# Patient Record
Sex: Female | Born: 1960 | Race: Black or African American | Hispanic: No | Smoking: Former smoker
Health system: Southern US, Community
[De-identification: ages and names within clinical notes are randomized; demographics above are authoritative.]

## PROBLEM LIST (undated history)

## (undated) DIAGNOSIS — M545 Low back pain, unspecified: Secondary | ICD-10-CM

## (undated) DIAGNOSIS — I272 Pulmonary hypertension, unspecified: Secondary | ICD-10-CM

## (undated) DIAGNOSIS — M4800 Spinal stenosis, site unspecified: Secondary | ICD-10-CM

## (undated) DIAGNOSIS — J8410 Pulmonary fibrosis, unspecified: Secondary | ICD-10-CM

## (undated) DIAGNOSIS — M519 Unspecified thoracic, thoracolumbar and lumbosacral intervertebral disc disorder: Secondary | ICD-10-CM

## (undated) DIAGNOSIS — L930 Discoid lupus erythematosus: Secondary | ICD-10-CM

## (undated) DIAGNOSIS — E119 Type 2 diabetes mellitus without complications: Secondary | ICD-10-CM

## (undated) DIAGNOSIS — K224 Dyskinesia of esophagus: Secondary | ICD-10-CM

## (undated) DIAGNOSIS — G629 Polyneuropathy, unspecified: Secondary | ICD-10-CM

## (undated) DIAGNOSIS — L309 Dermatitis, unspecified: Secondary | ICD-10-CM

## (undated) DIAGNOSIS — J449 Chronic obstructive pulmonary disease, unspecified: Secondary | ICD-10-CM

## (undated) DIAGNOSIS — M349 Systemic sclerosis, unspecified: Secondary | ICD-10-CM

## (undated) DIAGNOSIS — K219 Gastro-esophageal reflux disease without esophagitis: Secondary | ICD-10-CM

## (undated) DIAGNOSIS — L943 Sclerodactyly: Secondary | ICD-10-CM

## (undated) DIAGNOSIS — I7300 Raynaud's syndrome without gangrene: Secondary | ICD-10-CM

## (undated) DIAGNOSIS — I10 Essential (primary) hypertension: Secondary | ICD-10-CM

## (undated) DIAGNOSIS — A6000 Herpesviral infection of urogenital system, unspecified: Secondary | ICD-10-CM

## (undated) DIAGNOSIS — B009 Herpesviral infection, unspecified: Secondary | ICD-10-CM

## (undated) HISTORY — PX: DILATION AND CURETTAGE OF UTERUS: SHX78

## (undated) HISTORY — PX: TUBAL LIGATION: SHX77

## (undated) HISTORY — PX: SPLENECTOMY: SUR1306

## (undated) HISTORY — PX: OTHER SURGICAL HISTORY: SHX169

## (undated) HISTORY — DX: Herpesviral infection, unspecified: B00.9

---

## 2004-08-06 ENCOUNTER — Ambulatory Visit: Payer: Self-pay

## 2006-11-17 ENCOUNTER — Ambulatory Visit: Payer: Self-pay | Admitting: Rheumatology

## 2007-03-09 ENCOUNTER — Ambulatory Visit: Payer: Self-pay | Admitting: Unknown Physician Specialty

## 2008-03-09 ENCOUNTER — Ambulatory Visit: Payer: Self-pay | Admitting: Unknown Physician Specialty

## 2009-03-13 ENCOUNTER — Ambulatory Visit: Payer: Self-pay | Admitting: Unknown Physician Specialty

## 2009-03-19 ENCOUNTER — Ambulatory Visit: Payer: Self-pay | Admitting: Rheumatology

## 2010-03-12 ENCOUNTER — Ambulatory Visit: Payer: Self-pay | Admitting: Unknown Physician Specialty

## 2010-04-04 ENCOUNTER — Encounter: Payer: Self-pay | Admitting: Rheumatology

## 2010-04-27 ENCOUNTER — Encounter: Payer: Self-pay | Admitting: Rheumatology

## 2010-05-28 ENCOUNTER — Encounter: Payer: Self-pay | Admitting: Rheumatology

## 2011-03-28 ENCOUNTER — Ambulatory Visit: Payer: Self-pay | Admitting: Cardiology

## 2011-05-15 ENCOUNTER — Ambulatory Visit: Payer: Self-pay | Admitting: Unknown Physician Specialty

## 2011-09-05 ENCOUNTER — Ambulatory Visit: Payer: Self-pay | Admitting: Gastroenterology

## 2011-09-05 LAB — KOH PREP

## 2011-09-08 LAB — PATHOLOGY REPORT

## 2011-12-16 ENCOUNTER — Ambulatory Visit: Payer: Self-pay | Admitting: Rheumatology

## 2012-05-17 ENCOUNTER — Ambulatory Visit: Payer: Self-pay | Admitting: Unknown Physician Specialty

## 2012-05-24 ENCOUNTER — Ambulatory Visit: Payer: Self-pay | Admitting: Rheumatology

## 2013-05-14 ENCOUNTER — Ambulatory Visit: Payer: Self-pay | Admitting: Physical Medicine and Rehabilitation

## 2013-12-13 DIAGNOSIS — M519 Unspecified thoracic, thoracolumbar and lumbosacral intervertebral disc disorder: Secondary | ICD-10-CM | POA: Insufficient documentation

## 2013-12-13 DIAGNOSIS — M351 Other overlap syndromes: Secondary | ICD-10-CM | POA: Insufficient documentation

## 2013-12-14 DIAGNOSIS — I272 Pulmonary hypertension, unspecified: Secondary | ICD-10-CM | POA: Insufficient documentation

## 2013-12-14 DIAGNOSIS — J8410 Pulmonary fibrosis, unspecified: Secondary | ICD-10-CM | POA: Insufficient documentation

## 2014-08-04 DIAGNOSIS — J449 Chronic obstructive pulmonary disease, unspecified: Secondary | ICD-10-CM | POA: Diagnosis not present

## 2014-08-07 DIAGNOSIS — J449 Chronic obstructive pulmonary disease, unspecified: Secondary | ICD-10-CM | POA: Diagnosis not present

## 2014-09-04 DIAGNOSIS — J449 Chronic obstructive pulmonary disease, unspecified: Secondary | ICD-10-CM | POA: Diagnosis not present

## 2014-09-07 DIAGNOSIS — J449 Chronic obstructive pulmonary disease, unspecified: Secondary | ICD-10-CM | POA: Diagnosis not present

## 2014-09-21 DIAGNOSIS — J449 Chronic obstructive pulmonary disease, unspecified: Secondary | ICD-10-CM | POA: Diagnosis not present

## 2014-09-21 DIAGNOSIS — Z79899 Other long term (current) drug therapy: Secondary | ICD-10-CM | POA: Diagnosis not present

## 2014-09-21 DIAGNOSIS — E663 Overweight: Secondary | ICD-10-CM | POA: Diagnosis not present

## 2014-09-21 DIAGNOSIS — R1310 Dysphagia, unspecified: Secondary | ICD-10-CM | POA: Diagnosis not present

## 2014-09-21 DIAGNOSIS — R0602 Shortness of breath: Secondary | ICD-10-CM | POA: Diagnosis not present

## 2014-09-21 DIAGNOSIS — R7309 Other abnormal glucose: Secondary | ICD-10-CM | POA: Diagnosis not present

## 2014-09-21 DIAGNOSIS — J8410 Pulmonary fibrosis, unspecified: Secondary | ICD-10-CM | POA: Diagnosis not present

## 2014-09-21 DIAGNOSIS — R799 Abnormal finding of blood chemistry, unspecified: Secondary | ICD-10-CM | POA: Diagnosis not present

## 2014-09-21 DIAGNOSIS — M351 Other overlap syndromes: Secondary | ICD-10-CM | POA: Diagnosis not present

## 2014-10-03 DIAGNOSIS — J449 Chronic obstructive pulmonary disease, unspecified: Secondary | ICD-10-CM | POA: Diagnosis not present

## 2014-10-06 DIAGNOSIS — I10 Essential (primary) hypertension: Secondary | ICD-10-CM | POA: Diagnosis not present

## 2014-10-06 DIAGNOSIS — R7309 Other abnormal glucose: Secondary | ICD-10-CM | POA: Diagnosis not present

## 2014-10-06 DIAGNOSIS — K219 Gastro-esophageal reflux disease without esophagitis: Secondary | ICD-10-CM | POA: Diagnosis not present

## 2014-10-06 DIAGNOSIS — J449 Chronic obstructive pulmonary disease, unspecified: Secondary | ICD-10-CM | POA: Diagnosis not present

## 2014-11-03 DIAGNOSIS — J449 Chronic obstructive pulmonary disease, unspecified: Secondary | ICD-10-CM | POA: Diagnosis not present

## 2014-11-06 DIAGNOSIS — J449 Chronic obstructive pulmonary disease, unspecified: Secondary | ICD-10-CM | POA: Diagnosis not present

## 2014-12-03 DIAGNOSIS — J449 Chronic obstructive pulmonary disease, unspecified: Secondary | ICD-10-CM | POA: Diagnosis not present

## 2014-12-05 DIAGNOSIS — Z79899 Other long term (current) drug therapy: Secondary | ICD-10-CM | POA: Diagnosis not present

## 2014-12-05 DIAGNOSIS — M351 Other overlap syndromes: Secondary | ICD-10-CM | POA: Diagnosis not present

## 2014-12-06 DIAGNOSIS — J449 Chronic obstructive pulmonary disease, unspecified: Secondary | ICD-10-CM | POA: Diagnosis not present

## 2014-12-12 DIAGNOSIS — Z79899 Other long term (current) drug therapy: Secondary | ICD-10-CM | POA: Diagnosis not present

## 2014-12-12 DIAGNOSIS — M351 Other overlap syndromes: Secondary | ICD-10-CM | POA: Diagnosis not present

## 2014-12-12 DIAGNOSIS — J849 Interstitial pulmonary disease, unspecified: Secondary | ICD-10-CM | POA: Diagnosis not present

## 2014-12-12 DIAGNOSIS — H6123 Impacted cerumen, bilateral: Secondary | ICD-10-CM | POA: Diagnosis not present

## 2014-12-12 DIAGNOSIS — I7300 Raynaud's syndrome without gangrene: Secondary | ICD-10-CM | POA: Diagnosis not present

## 2015-01-03 DIAGNOSIS — J449 Chronic obstructive pulmonary disease, unspecified: Secondary | ICD-10-CM | POA: Diagnosis not present

## 2015-01-06 DIAGNOSIS — J449 Chronic obstructive pulmonary disease, unspecified: Secondary | ICD-10-CM | POA: Diagnosis not present

## 2015-01-11 DIAGNOSIS — M349 Systemic sclerosis, unspecified: Secondary | ICD-10-CM | POA: Diagnosis not present

## 2015-01-11 DIAGNOSIS — R7309 Other abnormal glucose: Secondary | ICD-10-CM | POA: Diagnosis not present

## 2015-01-11 DIAGNOSIS — I10 Essential (primary) hypertension: Secondary | ICD-10-CM | POA: Diagnosis not present

## 2015-01-11 DIAGNOSIS — Z8679 Personal history of other diseases of the circulatory system: Secondary | ICD-10-CM | POA: Diagnosis not present

## 2015-02-02 DIAGNOSIS — J449 Chronic obstructive pulmonary disease, unspecified: Secondary | ICD-10-CM | POA: Diagnosis not present

## 2015-02-05 DIAGNOSIS — J449 Chronic obstructive pulmonary disease, unspecified: Secondary | ICD-10-CM | POA: Diagnosis not present

## 2015-03-05 DIAGNOSIS — J449 Chronic obstructive pulmonary disease, unspecified: Secondary | ICD-10-CM | POA: Diagnosis not present

## 2015-03-08 DIAGNOSIS — J449 Chronic obstructive pulmonary disease, unspecified: Secondary | ICD-10-CM | POA: Diagnosis not present

## 2015-03-14 DIAGNOSIS — I7300 Raynaud's syndrome without gangrene: Secondary | ICD-10-CM | POA: Diagnosis not present

## 2015-03-14 DIAGNOSIS — M351 Other overlap syndromes: Secondary | ICD-10-CM | POA: Diagnosis not present

## 2015-03-14 DIAGNOSIS — Z79899 Other long term (current) drug therapy: Secondary | ICD-10-CM | POA: Diagnosis not present

## 2015-03-29 DIAGNOSIS — I10 Essential (primary) hypertension: Secondary | ICD-10-CM | POA: Diagnosis not present

## 2015-03-29 DIAGNOSIS — R7309 Other abnormal glucose: Secondary | ICD-10-CM | POA: Diagnosis not present

## 2015-03-29 DIAGNOSIS — K219 Gastro-esophageal reflux disease without esophagitis: Secondary | ICD-10-CM | POA: Diagnosis not present

## 2015-04-05 DIAGNOSIS — I10 Essential (primary) hypertension: Secondary | ICD-10-CM | POA: Diagnosis not present

## 2015-04-05 DIAGNOSIS — E780 Pure hypercholesterolemia: Secondary | ICD-10-CM | POA: Diagnosis not present

## 2015-04-05 DIAGNOSIS — J449 Chronic obstructive pulmonary disease, unspecified: Secondary | ICD-10-CM | POA: Diagnosis not present

## 2015-04-05 DIAGNOSIS — Z0000 Encounter for general adult medical examination without abnormal findings: Secondary | ICD-10-CM | POA: Diagnosis not present

## 2015-04-05 DIAGNOSIS — R7309 Other abnormal glucose: Secondary | ICD-10-CM | POA: Diagnosis not present

## 2015-04-08 DIAGNOSIS — J449 Chronic obstructive pulmonary disease, unspecified: Secondary | ICD-10-CM | POA: Diagnosis not present

## 2015-04-24 DIAGNOSIS — I272 Other secondary pulmonary hypertension: Secondary | ICD-10-CM | POA: Diagnosis not present

## 2015-04-24 DIAGNOSIS — M349 Systemic sclerosis, unspecified: Secondary | ICD-10-CM | POA: Diagnosis not present

## 2015-04-24 DIAGNOSIS — R0602 Shortness of breath: Secondary | ICD-10-CM | POA: Diagnosis not present

## 2015-04-24 DIAGNOSIS — J8410 Pulmonary fibrosis, unspecified: Secondary | ICD-10-CM | POA: Diagnosis not present

## 2015-04-24 DIAGNOSIS — R0902 Hypoxemia: Secondary | ICD-10-CM | POA: Diagnosis not present

## 2015-05-05 DIAGNOSIS — J449 Chronic obstructive pulmonary disease, unspecified: Secondary | ICD-10-CM | POA: Diagnosis not present

## 2015-05-08 DIAGNOSIS — J449 Chronic obstructive pulmonary disease, unspecified: Secondary | ICD-10-CM | POA: Diagnosis not present

## 2015-05-11 DIAGNOSIS — Z23 Encounter for immunization: Secondary | ICD-10-CM | POA: Diagnosis not present

## 2015-06-05 DIAGNOSIS — J449 Chronic obstructive pulmonary disease, unspecified: Secondary | ICD-10-CM | POA: Diagnosis not present

## 2015-06-08 DIAGNOSIS — J449 Chronic obstructive pulmonary disease, unspecified: Secondary | ICD-10-CM | POA: Diagnosis not present

## 2015-06-19 DIAGNOSIS — I7300 Raynaud's syndrome without gangrene: Secondary | ICD-10-CM | POA: Diagnosis not present

## 2015-06-19 DIAGNOSIS — M351 Other overlap syndromes: Secondary | ICD-10-CM | POA: Diagnosis not present

## 2015-06-19 DIAGNOSIS — Z79899 Other long term (current) drug therapy: Secondary | ICD-10-CM | POA: Diagnosis not present

## 2015-06-26 DIAGNOSIS — M351 Other overlap syndromes: Secondary | ICD-10-CM | POA: Diagnosis not present

## 2015-06-26 DIAGNOSIS — J849 Interstitial pulmonary disease, unspecified: Secondary | ICD-10-CM | POA: Diagnosis not present

## 2015-06-26 DIAGNOSIS — Z79899 Other long term (current) drug therapy: Secondary | ICD-10-CM | POA: Diagnosis not present

## 2015-06-26 DIAGNOSIS — I7300 Raynaud's syndrome without gangrene: Secondary | ICD-10-CM | POA: Diagnosis not present

## 2015-07-05 DIAGNOSIS — J449 Chronic obstructive pulmonary disease, unspecified: Secondary | ICD-10-CM | POA: Diagnosis not present

## 2015-07-08 DIAGNOSIS — J449 Chronic obstructive pulmonary disease, unspecified: Secondary | ICD-10-CM | POA: Diagnosis not present

## 2015-07-12 DIAGNOSIS — Z1379 Encounter for other screening for genetic and chromosomal anomalies: Secondary | ICD-10-CM | POA: Diagnosis not present

## 2015-07-12 DIAGNOSIS — Z1211 Encounter for screening for malignant neoplasm of colon: Secondary | ICD-10-CM | POA: Diagnosis not present

## 2015-07-12 DIAGNOSIS — Z1231 Encounter for screening mammogram for malignant neoplasm of breast: Secondary | ICD-10-CM | POA: Diagnosis not present

## 2015-07-12 DIAGNOSIS — Z315 Encounter for genetic counseling: Secondary | ICD-10-CM | POA: Diagnosis not present

## 2015-07-12 DIAGNOSIS — Z01419 Encounter for gynecological examination (general) (routine) without abnormal findings: Secondary | ICD-10-CM | POA: Diagnosis not present

## 2015-07-17 LAB — HM PAP SMEAR: HM Pap smear: NEGATIVE

## 2015-08-05 DIAGNOSIS — J449 Chronic obstructive pulmonary disease, unspecified: Secondary | ICD-10-CM | POA: Diagnosis not present

## 2015-08-08 DIAGNOSIS — J449 Chronic obstructive pulmonary disease, unspecified: Secondary | ICD-10-CM | POA: Diagnosis not present

## 2015-09-05 DIAGNOSIS — J449 Chronic obstructive pulmonary disease, unspecified: Secondary | ICD-10-CM | POA: Diagnosis not present

## 2015-09-08 DIAGNOSIS — J449 Chronic obstructive pulmonary disease, unspecified: Secondary | ICD-10-CM | POA: Diagnosis not present

## 2015-09-17 DIAGNOSIS — Z79899 Other long term (current) drug therapy: Secondary | ICD-10-CM | POA: Diagnosis not present

## 2015-09-17 DIAGNOSIS — M351 Other overlap syndromes: Secondary | ICD-10-CM | POA: Diagnosis not present

## 2015-09-26 DIAGNOSIS — R7309 Other abnormal glucose: Secondary | ICD-10-CM | POA: Diagnosis not present

## 2015-09-26 DIAGNOSIS — I10 Essential (primary) hypertension: Secondary | ICD-10-CM | POA: Diagnosis not present

## 2015-09-26 DIAGNOSIS — E7800 Pure hypercholesterolemia, unspecified: Secondary | ICD-10-CM | POA: Diagnosis not present

## 2015-09-26 DIAGNOSIS — R7303 Prediabetes: Secondary | ICD-10-CM | POA: Diagnosis not present

## 2015-10-03 DIAGNOSIS — I10 Essential (primary) hypertension: Secondary | ICD-10-CM | POA: Diagnosis not present

## 2015-10-03 DIAGNOSIS — E7800 Pure hypercholesterolemia, unspecified: Secondary | ICD-10-CM | POA: Diagnosis not present

## 2015-10-03 DIAGNOSIS — K219 Gastro-esophageal reflux disease without esophagitis: Secondary | ICD-10-CM | POA: Diagnosis not present

## 2015-10-03 DIAGNOSIS — R7303 Prediabetes: Secondary | ICD-10-CM | POA: Diagnosis not present

## 2015-10-03 DIAGNOSIS — J449 Chronic obstructive pulmonary disease, unspecified: Secondary | ICD-10-CM | POA: Diagnosis not present

## 2015-10-06 DIAGNOSIS — J449 Chronic obstructive pulmonary disease, unspecified: Secondary | ICD-10-CM | POA: Diagnosis not present

## 2015-10-16 DIAGNOSIS — R0602 Shortness of breath: Secondary | ICD-10-CM | POA: Diagnosis not present

## 2015-10-23 DIAGNOSIS — R0902 Hypoxemia: Secondary | ICD-10-CM | POA: Diagnosis not present

## 2015-10-23 DIAGNOSIS — J8410 Pulmonary fibrosis, unspecified: Secondary | ICD-10-CM | POA: Diagnosis not present

## 2015-10-23 DIAGNOSIS — R0602 Shortness of breath: Secondary | ICD-10-CM | POA: Diagnosis not present

## 2015-10-23 DIAGNOSIS — K219 Gastro-esophageal reflux disease without esophagitis: Secondary | ICD-10-CM | POA: Diagnosis not present

## 2015-10-23 DIAGNOSIS — M349 Systemic sclerosis, unspecified: Secondary | ICD-10-CM | POA: Diagnosis not present

## 2015-10-23 DIAGNOSIS — I272 Other secondary pulmonary hypertension: Secondary | ICD-10-CM | POA: Diagnosis not present

## 2015-11-03 DIAGNOSIS — J449 Chronic obstructive pulmonary disease, unspecified: Secondary | ICD-10-CM | POA: Diagnosis not present

## 2015-11-06 DIAGNOSIS — J449 Chronic obstructive pulmonary disease, unspecified: Secondary | ICD-10-CM | POA: Diagnosis not present

## 2015-12-03 DIAGNOSIS — J449 Chronic obstructive pulmonary disease, unspecified: Secondary | ICD-10-CM | POA: Diagnosis not present

## 2015-12-06 DIAGNOSIS — J449 Chronic obstructive pulmonary disease, unspecified: Secondary | ICD-10-CM | POA: Diagnosis not present

## 2015-12-18 DIAGNOSIS — M351 Other overlap syndromes: Secondary | ICD-10-CM | POA: Diagnosis not present

## 2015-12-18 DIAGNOSIS — Z79899 Other long term (current) drug therapy: Secondary | ICD-10-CM | POA: Diagnosis not present

## 2015-12-25 DIAGNOSIS — I7300 Raynaud's syndrome without gangrene: Secondary | ICD-10-CM | POA: Diagnosis not present

## 2015-12-25 DIAGNOSIS — B029 Zoster without complications: Secondary | ICD-10-CM | POA: Diagnosis not present

## 2015-12-25 DIAGNOSIS — M351 Other overlap syndromes: Secondary | ICD-10-CM | POA: Diagnosis not present

## 2015-12-25 DIAGNOSIS — J849 Interstitial pulmonary disease, unspecified: Secondary | ICD-10-CM | POA: Diagnosis not present

## 2016-01-02 DIAGNOSIS — I272 Other secondary pulmonary hypertension: Secondary | ICD-10-CM | POA: Diagnosis not present

## 2016-01-02 DIAGNOSIS — I10 Essential (primary) hypertension: Secondary | ICD-10-CM | POA: Diagnosis not present

## 2016-01-03 DIAGNOSIS — J449 Chronic obstructive pulmonary disease, unspecified: Secondary | ICD-10-CM | POA: Diagnosis not present

## 2016-01-06 DIAGNOSIS — J449 Chronic obstructive pulmonary disease, unspecified: Secondary | ICD-10-CM | POA: Diagnosis not present

## 2016-01-08 ENCOUNTER — Encounter: Payer: Self-pay | Admitting: *Deleted

## 2016-01-08 ENCOUNTER — Ambulatory Visit
Admission: RE | Admit: 2016-01-08 | Discharge: 2016-01-08 | Disposition: A | Discharge status: Home / Self Care | Payer: Commercial Managed Care - HMO | Source: Ambulatory Visit | Attending: Cardiology | Admitting: Cardiology

## 2016-01-08 ENCOUNTER — Encounter
Admission: RE | Disposition: A | Discharge status: Home / Self Care | Payer: Self-pay | Source: Ambulatory Visit | Attending: Cardiology

## 2016-01-08 DIAGNOSIS — I272 Other secondary pulmonary hypertension: Secondary | ICD-10-CM | POA: Insufficient documentation

## 2016-01-08 DIAGNOSIS — I7300 Raynaud's syndrome without gangrene: Secondary | ICD-10-CM | POA: Insufficient documentation

## 2016-01-08 DIAGNOSIS — L943 Sclerodactyly: Secondary | ICD-10-CM | POA: Diagnosis not present

## 2016-01-08 DIAGNOSIS — Z7982 Long term (current) use of aspirin: Secondary | ICD-10-CM | POA: Diagnosis not present

## 2016-01-08 DIAGNOSIS — Z79899 Other long term (current) drug therapy: Secondary | ICD-10-CM | POA: Diagnosis not present

## 2016-01-08 DIAGNOSIS — J8410 Pulmonary fibrosis, unspecified: Secondary | ICD-10-CM | POA: Insufficient documentation

## 2016-01-08 DIAGNOSIS — M4800 Spinal stenosis, site unspecified: Secondary | ICD-10-CM | POA: Diagnosis not present

## 2016-01-08 DIAGNOSIS — M349 Systemic sclerosis, unspecified: Secondary | ICD-10-CM | POA: Diagnosis not present

## 2016-01-08 DIAGNOSIS — Z9889 Other specified postprocedural states: Secondary | ICD-10-CM | POA: Diagnosis not present

## 2016-01-08 DIAGNOSIS — M351 Other overlap syndromes: Secondary | ICD-10-CM | POA: Insufficient documentation

## 2016-01-08 DIAGNOSIS — Z9851 Tubal ligation status: Secondary | ICD-10-CM | POA: Insufficient documentation

## 2016-01-08 DIAGNOSIS — I10 Essential (primary) hypertension: Secondary | ICD-10-CM | POA: Insufficient documentation

## 2016-01-08 DIAGNOSIS — R7303 Prediabetes: Secondary | ICD-10-CM | POA: Insufficient documentation

## 2016-01-08 DIAGNOSIS — K219 Gastro-esophageal reflux disease without esophagitis: Secondary | ICD-10-CM | POA: Insufficient documentation

## 2016-01-08 DIAGNOSIS — Z87891 Personal history of nicotine dependence: Secondary | ICD-10-CM | POA: Insufficient documentation

## 2016-01-08 DIAGNOSIS — G629 Polyneuropathy, unspecified: Secondary | ICD-10-CM | POA: Diagnosis not present

## 2016-01-08 DIAGNOSIS — Z809 Family history of malignant neoplasm, unspecified: Secondary | ICD-10-CM | POA: Insufficient documentation

## 2016-01-08 DIAGNOSIS — Z888 Allergy status to other drugs, medicaments and biological substances status: Secondary | ICD-10-CM | POA: Diagnosis not present

## 2016-01-08 HISTORY — DX: Pulmonary fibrosis, unspecified: J84.10

## 2016-01-08 HISTORY — DX: Gastro-esophageal reflux disease without esophagitis: K21.9

## 2016-01-08 HISTORY — DX: Low back pain, unspecified: M54.50

## 2016-01-08 HISTORY — DX: Essential (primary) hypertension: I10

## 2016-01-08 HISTORY — DX: Unspecified thoracic, thoracolumbar and lumbosacral intervertebral disc disorder: M51.9

## 2016-01-08 HISTORY — DX: Low back pain: M54.5

## 2016-01-08 HISTORY — DX: Raynaud's syndrome without gangrene: I73.00

## 2016-01-08 HISTORY — DX: Systemic sclerosis, unspecified: M34.9

## 2016-01-08 HISTORY — DX: Discoid lupus erythematosus: L93.0

## 2016-01-08 HISTORY — DX: Dermatitis, unspecified: L30.9

## 2016-01-08 HISTORY — DX: Pulmonary hypertension, unspecified: I27.20

## 2016-01-08 HISTORY — DX: Chronic obstructive pulmonary disease, unspecified: J44.9

## 2016-01-08 HISTORY — DX: Dyskinesia of esophagus: K22.4

## 2016-01-08 HISTORY — DX: Sclerodactyly: L94.3

## 2016-01-08 HISTORY — DX: Type 2 diabetes mellitus without complications: E11.9

## 2016-01-08 HISTORY — DX: Herpesviral infection of urogenital system, unspecified: A60.00

## 2016-01-08 HISTORY — DX: Polyneuropathy, unspecified: G62.9

## 2016-01-08 HISTORY — DX: Spinal stenosis, site unspecified: M48.00

## 2016-01-08 SURGERY — RIGHT HEART CATH AND CORONARY ANGIOGRAPHY
Anesthesia: Moderate Sedation | Laterality: Right

## 2016-01-08 MED ORDER — ASPIRIN 81 MG PO CHEW: 81.0000 mg | CHEWABLE_TABLET | ORAL | Status: DC

## 2016-01-08 MED ORDER — SODIUM CHLORIDE 0.9% FLUSH: 3.0000 mL | Freq: Two times a day (BID) | INTRAVENOUS | Status: DC

## 2016-01-08 MED ORDER — MIDAZOLAM HCL 2 MG/2ML IJ SOLN
INTRAMUSCULAR | Status: AC
  Filled 2016-01-08: qty 2

## 2016-01-08 MED ORDER — SODIUM CHLORIDE 0.9 % WEIGHT BASED INFUSION: 3.0000 mL/kg/h | INTRAVENOUS | Status: DC

## 2016-01-08 MED ORDER — SODIUM CHLORIDE 0.9% FLUSH: 3.0000 mL | INTRAVENOUS | Status: DC | PRN

## 2016-01-08 MED ORDER — FENTANYL CITRATE (PF) 100 MCG/2ML IJ SOLN
INTRAMUSCULAR | Status: DC | PRN
  Administered 2016-01-08: 25 ug via INTRAVENOUS
  Administered 2016-01-08: 50 ug via INTRAVENOUS

## 2016-01-08 MED ORDER — SODIUM CHLORIDE 0.9 % WEIGHT BASED INFUSION: 1.0000 mL/kg/h | INTRAVENOUS | Status: DC

## 2016-01-08 MED ORDER — FENTANYL CITRATE (PF) 100 MCG/2ML IJ SOLN
INTRAMUSCULAR | Status: AC
  Filled 2016-01-08: qty 2

## 2016-01-08 MED ORDER — SODIUM CHLORIDE 0.9 % IV SOLN: 250.0000 mL | INTRAVENOUS | Status: DC | PRN

## 2016-01-08 MED ORDER — MIDAZOLAM HCL 2 MG/2ML IJ SOLN
INTRAMUSCULAR | Status: DC | PRN
Start: 2016-01-08 — End: 2016-01-08
  Administered 2016-01-08 (×2): 1 mg via INTRAVENOUS

## 2016-01-08 SURGICAL SUPPLY — 6 items
CATH SWANZ 7F THERMO (CATHETERS) ×3 IMPLANT
GUIDEWIRE EMER 3M J .025X150CM (WIRE) ×3 IMPLANT
KIT RIGHT HEART (MISCELLANEOUS) ×3 IMPLANT
NEEDLE PERC 18GX7CM (NEEDLE) ×6 IMPLANT
PACK CARDIAC CATH (CUSTOM PROCEDURE TRAY) ×3 IMPLANT
SHEATH PINNACLE 7F 10CM (SHEATH) ×3 IMPLANT

## 2016-01-08 NOTE — Discharge Instructions (Signed)

## 2016-01-09 ENCOUNTER — Encounter: Payer: Self-pay | Admitting: Cardiology

## 2016-01-11 DIAGNOSIS — J8410 Pulmonary fibrosis, unspecified: Secondary | ICD-10-CM | POA: Diagnosis not present

## 2016-01-11 DIAGNOSIS — I10 Essential (primary) hypertension: Secondary | ICD-10-CM | POA: Diagnosis not present

## 2016-01-11 DIAGNOSIS — I272 Other secondary pulmonary hypertension: Secondary | ICD-10-CM | POA: Diagnosis not present

## 2016-01-11 DIAGNOSIS — E7800 Pure hypercholesterolemia, unspecified: Secondary | ICD-10-CM | POA: Diagnosis not present

## 2016-02-02 DIAGNOSIS — J449 Chronic obstructive pulmonary disease, unspecified: Secondary | ICD-10-CM | POA: Diagnosis not present

## 2016-02-05 DIAGNOSIS — J449 Chronic obstructive pulmonary disease, unspecified: Secondary | ICD-10-CM | POA: Diagnosis not present

## 2016-03-04 DIAGNOSIS — J449 Chronic obstructive pulmonary disease, unspecified: Secondary | ICD-10-CM | POA: Diagnosis not present

## 2016-03-07 DIAGNOSIS — J449 Chronic obstructive pulmonary disease, unspecified: Secondary | ICD-10-CM | POA: Diagnosis not present

## 2016-03-24 DIAGNOSIS — E7800 Pure hypercholesterolemia, unspecified: Secondary | ICD-10-CM | POA: Diagnosis not present

## 2016-03-24 DIAGNOSIS — I10 Essential (primary) hypertension: Secondary | ICD-10-CM | POA: Diagnosis not present

## 2016-03-24 DIAGNOSIS — M351 Other overlap syndromes: Secondary | ICD-10-CM | POA: Diagnosis not present

## 2016-03-24 DIAGNOSIS — R7303 Prediabetes: Secondary | ICD-10-CM | POA: Diagnosis not present

## 2016-04-01 DIAGNOSIS — Z0000 Encounter for general adult medical examination without abnormal findings: Secondary | ICD-10-CM | POA: Diagnosis not present

## 2016-04-01 DIAGNOSIS — I10 Essential (primary) hypertension: Secondary | ICD-10-CM | POA: Diagnosis not present

## 2016-04-04 DIAGNOSIS — J449 Chronic obstructive pulmonary disease, unspecified: Secondary | ICD-10-CM | POA: Diagnosis not present

## 2016-04-07 DIAGNOSIS — J449 Chronic obstructive pulmonary disease, unspecified: Secondary | ICD-10-CM | POA: Diagnosis not present

## 2016-04-08 DIAGNOSIS — I10 Essential (primary) hypertension: Secondary | ICD-10-CM | POA: Diagnosis not present

## 2016-04-08 DIAGNOSIS — E7800 Pure hypercholesterolemia, unspecified: Secondary | ICD-10-CM | POA: Diagnosis not present

## 2016-04-08 DIAGNOSIS — R7303 Prediabetes: Secondary | ICD-10-CM | POA: Diagnosis not present

## 2016-04-08 DIAGNOSIS — Z0000 Encounter for general adult medical examination without abnormal findings: Secondary | ICD-10-CM | POA: Diagnosis not present

## 2016-04-29 DIAGNOSIS — Z23 Encounter for immunization: Secondary | ICD-10-CM | POA: Diagnosis not present

## 2016-05-04 DIAGNOSIS — J449 Chronic obstructive pulmonary disease, unspecified: Secondary | ICD-10-CM | POA: Diagnosis not present

## 2016-05-07 DIAGNOSIS — J449 Chronic obstructive pulmonary disease, unspecified: Secondary | ICD-10-CM | POA: Diagnosis not present

## 2016-06-04 DIAGNOSIS — J449 Chronic obstructive pulmonary disease, unspecified: Secondary | ICD-10-CM | POA: Diagnosis not present

## 2016-06-07 DIAGNOSIS — J449 Chronic obstructive pulmonary disease, unspecified: Secondary | ICD-10-CM | POA: Diagnosis not present

## 2016-06-09 ENCOUNTER — Other Ambulatory Visit: Payer: Self-pay | Admitting: Obstetrics & Gynecology

## 2016-06-09 DIAGNOSIS — Z1231 Encounter for screening mammogram for malignant neoplasm of breast: Secondary | ICD-10-CM

## 2016-06-16 DIAGNOSIS — M351 Other overlap syndromes: Secondary | ICD-10-CM | POA: Diagnosis not present

## 2016-06-23 DIAGNOSIS — R42 Dizziness and giddiness: Secondary | ICD-10-CM | POA: Diagnosis not present

## 2016-06-23 DIAGNOSIS — I7300 Raynaud's syndrome without gangrene: Secondary | ICD-10-CM | POA: Diagnosis not present

## 2016-06-23 DIAGNOSIS — J849 Interstitial pulmonary disease, unspecified: Secondary | ICD-10-CM | POA: Diagnosis not present

## 2016-06-23 DIAGNOSIS — Z79899 Other long term (current) drug therapy: Secondary | ICD-10-CM | POA: Diagnosis not present

## 2016-06-23 DIAGNOSIS — M351 Other overlap syndromes: Secondary | ICD-10-CM | POA: Diagnosis not present

## 2016-06-24 DIAGNOSIS — R0602 Shortness of breath: Secondary | ICD-10-CM | POA: Diagnosis not present

## 2016-06-24 DIAGNOSIS — R0902 Hypoxemia: Secondary | ICD-10-CM | POA: Diagnosis not present

## 2016-06-24 DIAGNOSIS — I272 Pulmonary hypertension, unspecified: Secondary | ICD-10-CM | POA: Diagnosis not present

## 2016-06-24 DIAGNOSIS — J8410 Pulmonary fibrosis, unspecified: Secondary | ICD-10-CM | POA: Diagnosis not present

## 2016-06-24 DIAGNOSIS — M349 Systemic sclerosis, unspecified: Secondary | ICD-10-CM | POA: Diagnosis not present

## 2016-07-04 DIAGNOSIS — J449 Chronic obstructive pulmonary disease, unspecified: Secondary | ICD-10-CM | POA: Diagnosis not present

## 2016-07-07 DIAGNOSIS — J449 Chronic obstructive pulmonary disease, unspecified: Secondary | ICD-10-CM | POA: Diagnosis not present

## 2016-07-15 ENCOUNTER — Ambulatory Visit
Admission: RE | Admit: 2016-07-15 | Discharge: 2016-07-15 | Disposition: A | Discharge status: Home / Self Care | Payer: Commercial Managed Care - HMO | Source: Ambulatory Visit | Attending: Obstetrics & Gynecology | Admitting: Obstetrics & Gynecology

## 2016-07-15 DIAGNOSIS — Z1231 Encounter for screening mammogram for malignant neoplasm of breast: Secondary | ICD-10-CM | POA: Insufficient documentation

## 2016-07-15 DIAGNOSIS — Z01419 Encounter for gynecological examination (general) (routine) without abnormal findings: Secondary | ICD-10-CM | POA: Diagnosis not present

## 2016-07-15 LAB — HM MAMMOGRAPHY

## 2016-07-17 ENCOUNTER — Inpatient Hospital Stay
Admission: RE | Admit: 2016-07-17 | Discharge: 2016-07-17 | Disposition: A | Discharge status: Home / Self Care | Payer: Self-pay | Source: Ambulatory Visit | Attending: *Deleted | Admitting: *Deleted

## 2016-07-17 ENCOUNTER — Other Ambulatory Visit: Payer: Self-pay | Admitting: *Deleted

## 2016-07-17 DIAGNOSIS — R42 Dizziness and giddiness: Secondary | ICD-10-CM | POA: Diagnosis not present

## 2016-07-17 DIAGNOSIS — Z9289 Personal history of other medical treatment: Secondary | ICD-10-CM

## 2016-07-23 DIAGNOSIS — H40013 Open angle with borderline findings, low risk, bilateral: Secondary | ICD-10-CM | POA: Diagnosis not present

## 2016-07-23 DIAGNOSIS — H35033 Hypertensive retinopathy, bilateral: Secondary | ICD-10-CM | POA: Diagnosis not present

## 2016-07-23 DIAGNOSIS — H52223 Regular astigmatism, bilateral: Secondary | ICD-10-CM | POA: Diagnosis not present

## 2016-07-23 DIAGNOSIS — H5213 Myopia, bilateral: Secondary | ICD-10-CM | POA: Diagnosis not present

## 2016-07-23 DIAGNOSIS — H35413 Lattice degeneration of retina, bilateral: Secondary | ICD-10-CM | POA: Diagnosis not present

## 2016-07-23 DIAGNOSIS — H34231 Retinal artery branch occlusion, right eye: Secondary | ICD-10-CM | POA: Diagnosis not present

## 2016-07-23 DIAGNOSIS — I10 Essential (primary) hypertension: Secondary | ICD-10-CM | POA: Diagnosis not present

## 2016-07-23 DIAGNOSIS — H524 Presbyopia: Secondary | ICD-10-CM | POA: Diagnosis not present

## 2016-08-04 DIAGNOSIS — J449 Chronic obstructive pulmonary disease, unspecified: Secondary | ICD-10-CM | POA: Diagnosis not present

## 2016-08-07 DIAGNOSIS — J449 Chronic obstructive pulmonary disease, unspecified: Secondary | ICD-10-CM | POA: Diagnosis not present

## 2016-08-15 DIAGNOSIS — H43823 Vitreomacular adhesion, bilateral: Secondary | ICD-10-CM | POA: Diagnosis not present

## 2016-08-15 DIAGNOSIS — H35033 Hypertensive retinopathy, bilateral: Secondary | ICD-10-CM | POA: Diagnosis not present

## 2016-08-15 DIAGNOSIS — H34831 Tributary (branch) retinal vein occlusion, right eye, with macular edema: Secondary | ICD-10-CM | POA: Diagnosis not present

## 2016-08-28 DIAGNOSIS — H35033 Hypertensive retinopathy, bilateral: Secondary | ICD-10-CM | POA: Diagnosis not present

## 2016-08-28 DIAGNOSIS — H34831 Tributary (branch) retinal vein occlusion, right eye, with macular edema: Secondary | ICD-10-CM | POA: Diagnosis not present

## 2016-09-04 DIAGNOSIS — J449 Chronic obstructive pulmonary disease, unspecified: Secondary | ICD-10-CM | POA: Diagnosis not present

## 2016-09-07 DIAGNOSIS — J449 Chronic obstructive pulmonary disease, unspecified: Secondary | ICD-10-CM | POA: Diagnosis not present

## 2016-09-23 DIAGNOSIS — M351 Other overlap syndromes: Secondary | ICD-10-CM | POA: Diagnosis not present

## 2016-09-23 DIAGNOSIS — Z79899 Other long term (current) drug therapy: Secondary | ICD-10-CM | POA: Diagnosis not present

## 2016-09-26 DIAGNOSIS — H6121 Impacted cerumen, right ear: Secondary | ICD-10-CM | POA: Diagnosis not present

## 2016-09-29 DIAGNOSIS — R7303 Prediabetes: Secondary | ICD-10-CM | POA: Diagnosis not present

## 2016-09-29 DIAGNOSIS — I10 Essential (primary) hypertension: Secondary | ICD-10-CM | POA: Diagnosis not present

## 2016-09-29 DIAGNOSIS — E7800 Pure hypercholesterolemia, unspecified: Secondary | ICD-10-CM | POA: Diagnosis not present

## 2016-10-02 DIAGNOSIS — H35033 Hypertensive retinopathy, bilateral: Secondary | ICD-10-CM | POA: Diagnosis not present

## 2016-10-02 DIAGNOSIS — H34831 Tributary (branch) retinal vein occlusion, right eye, with macular edema: Secondary | ICD-10-CM | POA: Diagnosis not present

## 2016-10-02 DIAGNOSIS — J449 Chronic obstructive pulmonary disease, unspecified: Secondary | ICD-10-CM | POA: Diagnosis not present

## 2016-10-02 DIAGNOSIS — H35413 Lattice degeneration of retina, bilateral: Secondary | ICD-10-CM | POA: Diagnosis not present

## 2016-10-02 DIAGNOSIS — H43822 Vitreomacular adhesion, left eye: Secondary | ICD-10-CM | POA: Diagnosis not present

## 2016-10-05 DIAGNOSIS — J449 Chronic obstructive pulmonary disease, unspecified: Secondary | ICD-10-CM | POA: Diagnosis not present

## 2016-10-07 DIAGNOSIS — I10 Essential (primary) hypertension: Secondary | ICD-10-CM | POA: Diagnosis not present

## 2016-10-07 DIAGNOSIS — R7303 Prediabetes: Secondary | ICD-10-CM | POA: Diagnosis not present

## 2016-10-07 DIAGNOSIS — K219 Gastro-esophageal reflux disease without esophagitis: Secondary | ICD-10-CM | POA: Diagnosis not present

## 2016-10-07 DIAGNOSIS — M5417 Radiculopathy, lumbosacral region: Secondary | ICD-10-CM | POA: Diagnosis not present

## 2016-10-07 DIAGNOSIS — E7800 Pure hypercholesterolemia, unspecified: Secondary | ICD-10-CM | POA: Diagnosis not present

## 2016-11-02 DIAGNOSIS — J449 Chronic obstructive pulmonary disease, unspecified: Secondary | ICD-10-CM | POA: Diagnosis not present

## 2016-11-05 DIAGNOSIS — J449 Chronic obstructive pulmonary disease, unspecified: Secondary | ICD-10-CM | POA: Diagnosis not present

## 2016-11-06 DIAGNOSIS — H35033 Hypertensive retinopathy, bilateral: Secondary | ICD-10-CM | POA: Diagnosis not present

## 2016-11-06 DIAGNOSIS — H35413 Lattice degeneration of retina, bilateral: Secondary | ICD-10-CM | POA: Diagnosis not present

## 2016-11-06 DIAGNOSIS — H34831 Tributary (branch) retinal vein occlusion, right eye, with macular edema: Secondary | ICD-10-CM | POA: Diagnosis not present

## 2016-11-06 DIAGNOSIS — H43822 Vitreomacular adhesion, left eye: Secondary | ICD-10-CM | POA: Diagnosis not present

## 2016-12-02 DIAGNOSIS — J449 Chronic obstructive pulmonary disease, unspecified: Secondary | ICD-10-CM | POA: Diagnosis not present

## 2016-12-04 DIAGNOSIS — H35033 Hypertensive retinopathy, bilateral: Secondary | ICD-10-CM | POA: Diagnosis not present

## 2016-12-04 DIAGNOSIS — H34831 Tributary (branch) retinal vein occlusion, right eye, with macular edema: Secondary | ICD-10-CM | POA: Diagnosis not present

## 2016-12-04 DIAGNOSIS — H43822 Vitreomacular adhesion, left eye: Secondary | ICD-10-CM | POA: Diagnosis not present

## 2016-12-04 DIAGNOSIS — H31091 Other chorioretinal scars, right eye: Secondary | ICD-10-CM | POA: Diagnosis not present

## 2016-12-05 DIAGNOSIS — J449 Chronic obstructive pulmonary disease, unspecified: Secondary | ICD-10-CM | POA: Diagnosis not present

## 2016-12-16 DIAGNOSIS — Z79899 Other long term (current) drug therapy: Secondary | ICD-10-CM | POA: Diagnosis not present

## 2016-12-16 DIAGNOSIS — M351 Other overlap syndromes: Secondary | ICD-10-CM | POA: Diagnosis not present

## 2016-12-23 DIAGNOSIS — J849 Interstitial pulmonary disease, unspecified: Secondary | ICD-10-CM | POA: Diagnosis not present

## 2016-12-23 DIAGNOSIS — I7300 Raynaud's syndrome without gangrene: Secondary | ICD-10-CM | POA: Diagnosis not present

## 2016-12-23 DIAGNOSIS — M351 Other overlap syndromes: Secondary | ICD-10-CM | POA: Diagnosis not present

## 2016-12-23 DIAGNOSIS — Z79899 Other long term (current) drug therapy: Secondary | ICD-10-CM | POA: Diagnosis not present

## 2016-12-29 DIAGNOSIS — R05 Cough: Secondary | ICD-10-CM | POA: Diagnosis not present

## 2016-12-30 DIAGNOSIS — R0602 Shortness of breath: Secondary | ICD-10-CM | POA: Diagnosis not present

## 2016-12-30 DIAGNOSIS — I272 Pulmonary hypertension, unspecified: Secondary | ICD-10-CM | POA: Diagnosis not present

## 2016-12-30 DIAGNOSIS — R05 Cough: Secondary | ICD-10-CM | POA: Diagnosis not present

## 2016-12-30 DIAGNOSIS — J8410 Pulmonary fibrosis, unspecified: Secondary | ICD-10-CM | POA: Diagnosis not present

## 2017-01-01 DIAGNOSIS — H34831 Tributary (branch) retinal vein occlusion, right eye, with macular edema: Secondary | ICD-10-CM | POA: Diagnosis not present

## 2017-01-01 DIAGNOSIS — H35413 Lattice degeneration of retina, bilateral: Secondary | ICD-10-CM | POA: Diagnosis not present

## 2017-01-01 DIAGNOSIS — H31091 Other chorioretinal scars, right eye: Secondary | ICD-10-CM | POA: Diagnosis not present

## 2017-01-01 DIAGNOSIS — H35033 Hypertensive retinopathy, bilateral: Secondary | ICD-10-CM | POA: Diagnosis not present

## 2017-01-02 DIAGNOSIS — J449 Chronic obstructive pulmonary disease, unspecified: Secondary | ICD-10-CM | POA: Diagnosis not present

## 2017-01-05 DIAGNOSIS — J449 Chronic obstructive pulmonary disease, unspecified: Secondary | ICD-10-CM | POA: Diagnosis not present

## 2017-01-29 DIAGNOSIS — H35413 Lattice degeneration of retina, bilateral: Secondary | ICD-10-CM | POA: Diagnosis not present

## 2017-01-29 DIAGNOSIS — H35033 Hypertensive retinopathy, bilateral: Secondary | ICD-10-CM | POA: Diagnosis not present

## 2017-01-29 DIAGNOSIS — H34831 Tributary (branch) retinal vein occlusion, right eye, with macular edema: Secondary | ICD-10-CM | POA: Diagnosis not present

## 2017-01-29 DIAGNOSIS — H43822 Vitreomacular adhesion, left eye: Secondary | ICD-10-CM | POA: Diagnosis not present

## 2017-02-01 DIAGNOSIS — J449 Chronic obstructive pulmonary disease, unspecified: Secondary | ICD-10-CM | POA: Diagnosis not present

## 2017-02-04 DIAGNOSIS — J449 Chronic obstructive pulmonary disease, unspecified: Secondary | ICD-10-CM | POA: Diagnosis not present

## 2017-03-02 DIAGNOSIS — H43822 Vitreomacular adhesion, left eye: Secondary | ICD-10-CM | POA: Diagnosis not present

## 2017-03-02 DIAGNOSIS — H35033 Hypertensive retinopathy, bilateral: Secondary | ICD-10-CM | POA: Diagnosis not present

## 2017-03-02 DIAGNOSIS — H35413 Lattice degeneration of retina, bilateral: Secondary | ICD-10-CM | POA: Diagnosis not present

## 2017-03-02 DIAGNOSIS — H34831 Tributary (branch) retinal vein occlusion, right eye, with macular edema: Secondary | ICD-10-CM | POA: Diagnosis not present

## 2017-03-04 DIAGNOSIS — J449 Chronic obstructive pulmonary disease, unspecified: Secondary | ICD-10-CM | POA: Diagnosis not present

## 2017-03-07 DIAGNOSIS — J449 Chronic obstructive pulmonary disease, unspecified: Secondary | ICD-10-CM | POA: Diagnosis not present

## 2017-03-10 DIAGNOSIS — E7800 Pure hypercholesterolemia, unspecified: Secondary | ICD-10-CM | POA: Diagnosis not present

## 2017-03-10 DIAGNOSIS — I10 Essential (primary) hypertension: Secondary | ICD-10-CM | POA: Diagnosis not present

## 2017-03-10 DIAGNOSIS — I272 Pulmonary hypertension, unspecified: Secondary | ICD-10-CM | POA: Diagnosis not present

## 2017-03-17 DIAGNOSIS — M351 Other overlap syndromes: Secondary | ICD-10-CM | POA: Diagnosis not present

## 2017-03-17 DIAGNOSIS — J849 Interstitial pulmonary disease, unspecified: Secondary | ICD-10-CM | POA: Diagnosis not present

## 2017-03-17 DIAGNOSIS — Z79899 Other long term (current) drug therapy: Secondary | ICD-10-CM | POA: Diagnosis not present

## 2017-03-17 DIAGNOSIS — I7300 Raynaud's syndrome without gangrene: Secondary | ICD-10-CM | POA: Diagnosis not present

## 2017-04-02 DIAGNOSIS — H35413 Lattice degeneration of retina, bilateral: Secondary | ICD-10-CM | POA: Diagnosis not present

## 2017-04-02 DIAGNOSIS — H43822 Vitreomacular adhesion, left eye: Secondary | ICD-10-CM | POA: Diagnosis not present

## 2017-04-02 DIAGNOSIS — H35033 Hypertensive retinopathy, bilateral: Secondary | ICD-10-CM | POA: Diagnosis not present

## 2017-04-02 DIAGNOSIS — H34831 Tributary (branch) retinal vein occlusion, right eye, with macular edema: Secondary | ICD-10-CM | POA: Diagnosis not present

## 2017-04-04 DIAGNOSIS — J449 Chronic obstructive pulmonary disease, unspecified: Secondary | ICD-10-CM | POA: Diagnosis not present

## 2017-04-07 DIAGNOSIS — J449 Chronic obstructive pulmonary disease, unspecified: Secondary | ICD-10-CM | POA: Diagnosis not present

## 2017-04-14 DIAGNOSIS — I10 Essential (primary) hypertension: Secondary | ICD-10-CM | POA: Diagnosis not present

## 2017-04-14 DIAGNOSIS — R7303 Prediabetes: Secondary | ICD-10-CM | POA: Diagnosis not present

## 2017-04-14 DIAGNOSIS — E7800 Pure hypercholesterolemia, unspecified: Secondary | ICD-10-CM | POA: Diagnosis not present

## 2017-04-14 DIAGNOSIS — K219 Gastro-esophageal reflux disease without esophagitis: Secondary | ICD-10-CM | POA: Diagnosis not present

## 2017-04-21 DIAGNOSIS — E7800 Pure hypercholesterolemia, unspecified: Secondary | ICD-10-CM | POA: Diagnosis not present

## 2017-04-21 DIAGNOSIS — R7303 Prediabetes: Secondary | ICD-10-CM | POA: Diagnosis not present

## 2017-04-21 DIAGNOSIS — Z23 Encounter for immunization: Secondary | ICD-10-CM | POA: Diagnosis not present

## 2017-04-21 DIAGNOSIS — I10 Essential (primary) hypertension: Secondary | ICD-10-CM | POA: Diagnosis not present

## 2017-04-21 DIAGNOSIS — Z0000 Encounter for general adult medical examination without abnormal findings: Secondary | ICD-10-CM | POA: Diagnosis not present

## 2017-05-04 DIAGNOSIS — H34831 Tributary (branch) retinal vein occlusion, right eye, with macular edema: Secondary | ICD-10-CM | POA: Diagnosis not present

## 2017-05-04 DIAGNOSIS — H43822 Vitreomacular adhesion, left eye: Secondary | ICD-10-CM | POA: Diagnosis not present

## 2017-05-04 DIAGNOSIS — J449 Chronic obstructive pulmonary disease, unspecified: Secondary | ICD-10-CM | POA: Diagnosis not present

## 2017-05-04 DIAGNOSIS — H35413 Lattice degeneration of retina, bilateral: Secondary | ICD-10-CM | POA: Diagnosis not present

## 2017-05-04 DIAGNOSIS — H35033 Hypertensive retinopathy, bilateral: Secondary | ICD-10-CM | POA: Diagnosis not present

## 2017-05-07 DIAGNOSIS — J449 Chronic obstructive pulmonary disease, unspecified: Secondary | ICD-10-CM | POA: Diagnosis not present

## 2017-06-01 DIAGNOSIS — H35413 Lattice degeneration of retina, bilateral: Secondary | ICD-10-CM | POA: Diagnosis not present

## 2017-06-01 DIAGNOSIS — H43822 Vitreomacular adhesion, left eye: Secondary | ICD-10-CM | POA: Diagnosis not present

## 2017-06-01 DIAGNOSIS — H34831 Tributary (branch) retinal vein occlusion, right eye, with macular edema: Secondary | ICD-10-CM | POA: Diagnosis not present

## 2017-06-01 DIAGNOSIS — H35033 Hypertensive retinopathy, bilateral: Secondary | ICD-10-CM | POA: Diagnosis not present

## 2017-06-04 DIAGNOSIS — J449 Chronic obstructive pulmonary disease, unspecified: Secondary | ICD-10-CM | POA: Diagnosis not present

## 2017-06-07 DIAGNOSIS — J449 Chronic obstructive pulmonary disease, unspecified: Secondary | ICD-10-CM | POA: Diagnosis not present

## 2017-06-08 ENCOUNTER — Other Ambulatory Visit: Payer: Self-pay | Admitting: Obstetrics & Gynecology

## 2017-06-08 DIAGNOSIS — Z1231 Encounter for screening mammogram for malignant neoplasm of breast: Secondary | ICD-10-CM

## 2017-06-16 DIAGNOSIS — I7300 Raynaud's syndrome without gangrene: Secondary | ICD-10-CM | POA: Diagnosis not present

## 2017-06-16 DIAGNOSIS — J849 Interstitial pulmonary disease, unspecified: Secondary | ICD-10-CM | POA: Diagnosis not present

## 2017-06-16 DIAGNOSIS — M351 Other overlap syndromes: Secondary | ICD-10-CM | POA: Diagnosis not present

## 2017-06-16 DIAGNOSIS — Z79899 Other long term (current) drug therapy: Secondary | ICD-10-CM | POA: Diagnosis not present

## 2017-06-23 DIAGNOSIS — J849 Interstitial pulmonary disease, unspecified: Secondary | ICD-10-CM | POA: Diagnosis not present

## 2017-06-23 DIAGNOSIS — I7300 Raynaud's syndrome without gangrene: Secondary | ICD-10-CM | POA: Diagnosis not present

## 2017-06-23 DIAGNOSIS — Z79899 Other long term (current) drug therapy: Secondary | ICD-10-CM | POA: Diagnosis not present

## 2017-06-23 DIAGNOSIS — M351 Other overlap syndromes: Secondary | ICD-10-CM | POA: Diagnosis not present

## 2017-06-23 DIAGNOSIS — K6289 Other specified diseases of anus and rectum: Secondary | ICD-10-CM | POA: Diagnosis not present

## 2017-06-29 DIAGNOSIS — H35033 Hypertensive retinopathy, bilateral: Secondary | ICD-10-CM | POA: Diagnosis not present

## 2017-06-29 DIAGNOSIS — H31091 Other chorioretinal scars, right eye: Secondary | ICD-10-CM | POA: Diagnosis not present

## 2017-06-29 DIAGNOSIS — H35413 Lattice degeneration of retina, bilateral: Secondary | ICD-10-CM | POA: Diagnosis not present

## 2017-06-29 DIAGNOSIS — H34831 Tributary (branch) retinal vein occlusion, right eye, with macular edema: Secondary | ICD-10-CM | POA: Diagnosis not present

## 2017-06-30 DIAGNOSIS — R0602 Shortness of breath: Secondary | ICD-10-CM | POA: Diagnosis not present

## 2017-07-04 DIAGNOSIS — J449 Chronic obstructive pulmonary disease, unspecified: Secondary | ICD-10-CM | POA: Diagnosis not present

## 2017-07-07 DIAGNOSIS — J449 Chronic obstructive pulmonary disease, unspecified: Secondary | ICD-10-CM | POA: Diagnosis not present

## 2017-07-16 ENCOUNTER — Encounter: Payer: Self-pay | Admitting: Obstetrics & Gynecology

## 2017-07-16 ENCOUNTER — Ambulatory Visit (INDEPENDENT_AMBULATORY_CARE_PROVIDER_SITE_OTHER): Payer: Medicare HMO | Admitting: Obstetrics & Gynecology

## 2017-07-16 VITALS — BP 130/80 | HR 96 | Ht 65.0 in | Wt 170.0 lb

## 2017-07-16 DIAGNOSIS — Z1231 Encounter for screening mammogram for malignant neoplasm of breast: Secondary | ICD-10-CM

## 2017-07-16 DIAGNOSIS — Z01419 Encounter for gynecological examination (general) (routine) without abnormal findings: Secondary | ICD-10-CM | POA: Diagnosis not present

## 2017-07-16 DIAGNOSIS — Z1239 Encounter for other screening for malignant neoplasm of breast: Secondary | ICD-10-CM

## 2017-07-16 DIAGNOSIS — Z1211 Encounter for screening for malignant neoplasm of colon: Secondary | ICD-10-CM | POA: Diagnosis not present

## 2017-07-16 DIAGNOSIS — Z0000 Encounter for general adult medical examination without abnormal findings: Secondary | ICD-10-CM

## 2017-07-16 NOTE — Patient Instructions (Signed)
PAP every three years Mammogram every year    Call [phone] to schedule at [facility] Colonoscopy every 10 years Labs yearly (with PCP)

## 2017-07-16 NOTE — Progress Notes (Signed)
HPI:      Tammy Barrett is a 56 y.o. G1P1 who LMP was in the past, she presents today for her annual examination.  The patient has no complaints today. The patient is sexually active. Herlast pap: approximate date 2016 and was normal and last mammogram: approximate date 2017 and was normal.  The patient does perform self breast exams.  There is no notable family history of breast or ovarian cancer in her family. The patient is not taking hormone replacement therapy. Patient denies post-menopausal vaginal bleeding.   The patient has regular exercise: yes. The patient denies current symptoms of depression.    GYN Hx: Last Colonoscopy:5 years ago. Normal.  Last DEXA: never ago.    PMHx: Past Medical History:  Diagnosis Date  . COPD (chronic obstructive pulmonary disease) ([place])   . Diabetes mellitus without complication ([place])   . Discoid lupus   . Eczema   . Esophageal dysmotility   . Genital herpes   . GERD (gastroesophageal reflux disease)   . HSV (herpes simplex virus) infection   . Hypertension   . Lumbago   . Lumbar disc disease   . Peripheral neuropathy   . Pulmonary fibrosis ([place])   . Pulmonary hypertension ([place])   . Raynaud's disease   . Sclerodactyly   . Scleroderma ([place])   . Spinal stenosis    Past Surgical History:  Procedure Laterality Date  . bone removal from pinkie toe    . DILATION AND CURETTAGE OF UTERUS    . SPLENECTOMY    . TUBAL LIGATION     Family History  Problem Relation Age of Onset  . Uterine cancer Mother   . Stroke Father    Social History   Tobacco Use  . Smoking status: Former Smoker    Packs/day: 0.50    Types: Cigarettes    Last attempt to quit: 01/08/2011    Years since quitting: 6.5  . Smokeless tobacco: Never Used  Substance Use Topics  . Alcohol use: No  . Drug use: No    Current Outpatient Medications:  .  amLODipine (NORVASC) 5 MG tablet, Take 5 mg by mouth daily., Disp: , Rfl:  .  aspirin 81 MG tablet, Take 81 mg by mouth  daily., Disp: , Rfl:  .  gabapentin (NEURONTIN) 600 MG tablet, Take 600 mg by mouth 4 (four) times daily., Disp: , Rfl:  .  Multiple Vitamins-Minerals (MULTIVITAMIN WITH MINERALS) tablet, Take 1 tablet by mouth daily., Disp: , Rfl:  .  mycophenolate (CELLCEPT) 500 MG tablet, Take 500 mg by mouth 2 (two) times daily., Disp: , Rfl:  .  omeprazole (PRILOSEC) 20 MG capsule, Take 20 mg by mouth daily., Disp: , Rfl:  Allergies: Hydroxychloroquine  Review of Systems  Constitutional: Negative for chills, fever and malaise/fatigue.  HENT: Negative for congestion, sinus pain and sore throat.   Eyes: Negative for blurred vision and pain.  Respiratory: Negative for cough and wheezing.   Cardiovascular: Negative for chest pain and leg swelling.  Gastrointestinal: Negative for abdominal pain, constipation, diarrhea, heartburn, nausea and vomiting.  Genitourinary: Negative for dysuria, frequency, hematuria and urgency.  Musculoskeletal: Negative for back pain, joint pain, myalgias and neck pain.  Skin: Negative for itching and rash.  Neurological: Negative for dizziness, tremors and weakness.  Endo/Heme/Allergies: Does not bruise/bleed easily.  Psychiatric/Behavioral: Negative for depression. The patient is not nervous/anxious and does not have insomnia.     Objective: BP 130/80   Pulse 96  Ht _0  (1.651 m)   Wt 170 lb (77.1 kg)   BMI 28.29 kg/m   Filed Weights   07/16/17 1000  Weight: 170 lb (77.1 kg)   Body mass index is 28.29 kg/m. Physical Exam  Constitutional: She is oriented to person, place, and time. She appears well-developed and well-nourished. No distress.  Genitourinary: Rectum normal, vagina normal and uterus normal. Pelvic exam was performed with patient supine. There is no rash or lesion on the right labia. There is no rash or lesion on the left labia. Vagina exhibits no lesion. No bleeding in the vagina. Right adnexum does not display mass and does not display tenderness. Left  adnexum does not display mass and does not display tenderness. Cervix does not exhibit motion tenderness, lesion, friability or polyp.   Uterus is mobile and midaxial. Uterus is not enlarged or exhibiting a mass.  HENT:  Head: Normocephalic and atraumatic. Head is without laceration.  Right Ear: Hearing normal.  Left Ear: Hearing normal.  Nose: No epistaxis.  No foreign bodies.  Mouth/Throat: Uvula is midline, oropharynx is clear and moist and mucous membranes are normal.  Eyes: Pupils are equal, round, and reactive to light.  Neck: Normal range of motion. Neck supple. No thyromegaly present.  Cardiovascular: Normal rate and regular rhythm. Exam reveals no gallop and no friction rub.  No murmur heard. Pulmonary/Chest: Effort normal and breath sounds normal. No respiratory distress. She has no wheezes. Right breast exhibits no mass, no skin change and no tenderness. Left breast exhibits no mass, no skin change and no tenderness.  Abdominal: Soft. Bowel sounds are normal. She exhibits no distension. There is no tenderness. There is no rebound.  Musculoskeletal: Normal range of motion.  Neurological: She is alert and oriented to person, place, and time. No cranial nerve deficit.  Skin: Skin is warm and dry.  Psychiatric: She has a normal mood and affect. Judgment normal.  Vitals reviewed.   Assessment: Annual Exam 1. Annual physical exam   2. Screen for colon cancer   3. Screening for breast cancer     Plan:            1.  Cervical Screening-  Pap smear schedule reviewed with patient  2. Breast screening- Exam annually and mammogram scheduled  3. Colonoscopy every 10 years, Hemoccult testing after age 66  4. Labs managed by PCP  5. Counseling for hormonal therapy: none, no change in therapy today    F/U  Return in about 1 year (around 07/16/2018) for Annual.  Barnett Applebaum, MD, [facility], [facility] 07/16/2017  10:35 AM

## 2017-07-22 ENCOUNTER — Encounter: Payer: Self-pay | Admitting: Obstetrics & Gynecology

## 2017-07-22 ENCOUNTER — Ambulatory Visit
Admission: RE | Admit: 2017-07-22 | Discharge: 2017-07-22 | Disposition: A | Discharge status: Home / Self Care | Payer: Commercial Managed Care - HMO | Source: Ambulatory Visit | Attending: Obstetrics & Gynecology | Admitting: Obstetrics & Gynecology

## 2017-07-22 DIAGNOSIS — Z1231 Encounter for screening mammogram for malignant neoplasm of breast: Secondary | ICD-10-CM | POA: Insufficient documentation

## 2017-08-03 DIAGNOSIS — H35033 Hypertensive retinopathy, bilateral: Secondary | ICD-10-CM | POA: Diagnosis not present

## 2017-08-03 DIAGNOSIS — H43812 Vitreous degeneration, left eye: Secondary | ICD-10-CM | POA: Diagnosis not present

## 2017-08-03 DIAGNOSIS — H34831 Tributary (branch) retinal vein occlusion, right eye, with macular edema: Secondary | ICD-10-CM | POA: Diagnosis not present

## 2017-08-03 DIAGNOSIS — H43822 Vitreomacular adhesion, left eye: Secondary | ICD-10-CM | POA: Diagnosis not present

## 2017-08-04 DIAGNOSIS — J449 Chronic obstructive pulmonary disease, unspecified: Secondary | ICD-10-CM | POA: Diagnosis not present

## 2017-08-07 DIAGNOSIS — J449 Chronic obstructive pulmonary disease, unspecified: Secondary | ICD-10-CM | POA: Diagnosis not present

## 2017-09-01 DIAGNOSIS — K600 Acute anal fissure: Secondary | ICD-10-CM | POA: Diagnosis not present

## 2017-09-01 DIAGNOSIS — K625 Hemorrhage of anus and rectum: Secondary | ICD-10-CM | POA: Diagnosis not present

## 2017-09-04 DIAGNOSIS — J449 Chronic obstructive pulmonary disease, unspecified: Secondary | ICD-10-CM | POA: Diagnosis not present

## 2017-09-07 DIAGNOSIS — J449 Chronic obstructive pulmonary disease, unspecified: Secondary | ICD-10-CM | POA: Diagnosis not present

## 2017-09-10 DIAGNOSIS — Z1211 Encounter for screening for malignant neoplasm of colon: Secondary | ICD-10-CM | POA: Diagnosis not present

## 2017-09-12 LAB — SPECIMEN STATUS REPORT

## 2017-09-12 LAB — FECAL OCCULT BLOOD, IMMUNOCHEMICAL: Fecal Occult Bld: NEGATIVE

## 2017-09-14 DIAGNOSIS — H35033 Hypertensive retinopathy, bilateral: Secondary | ICD-10-CM | POA: Diagnosis not present

## 2017-09-14 DIAGNOSIS — H43822 Vitreomacular adhesion, left eye: Secondary | ICD-10-CM | POA: Diagnosis not present

## 2017-09-14 DIAGNOSIS — H43812 Vitreous degeneration, left eye: Secondary | ICD-10-CM | POA: Diagnosis not present

## 2017-09-14 DIAGNOSIS — H34831 Tributary (branch) retinal vein occlusion, right eye, with macular edema: Secondary | ICD-10-CM | POA: Diagnosis not present

## 2017-09-15 DIAGNOSIS — K6289 Other specified diseases of anus and rectum: Secondary | ICD-10-CM | POA: Diagnosis not present

## 2017-09-15 DIAGNOSIS — K625 Hemorrhage of anus and rectum: Secondary | ICD-10-CM | POA: Diagnosis not present

## 2017-09-22 DIAGNOSIS — I7300 Raynaud's syndrome without gangrene: Secondary | ICD-10-CM | POA: Diagnosis not present

## 2017-09-22 DIAGNOSIS — J849 Interstitial pulmonary disease, unspecified: Secondary | ICD-10-CM | POA: Diagnosis not present

## 2017-09-22 DIAGNOSIS — M351 Other overlap syndromes: Secondary | ICD-10-CM | POA: Diagnosis not present

## 2017-09-22 DIAGNOSIS — Z79899 Other long term (current) drug therapy: Secondary | ICD-10-CM | POA: Diagnosis not present

## 2017-09-25 DIAGNOSIS — K625 Hemorrhage of anus and rectum: Secondary | ICD-10-CM | POA: Diagnosis not present

## 2017-09-25 DIAGNOSIS — K640 First degree hemorrhoids: Secondary | ICD-10-CM | POA: Diagnosis not present

## 2017-09-25 DIAGNOSIS — K6289 Other specified diseases of anus and rectum: Secondary | ICD-10-CM | POA: Diagnosis not present

## 2017-10-02 DIAGNOSIS — J449 Chronic obstructive pulmonary disease, unspecified: Secondary | ICD-10-CM | POA: Diagnosis not present

## 2017-10-05 DIAGNOSIS — J449 Chronic obstructive pulmonary disease, unspecified: Secondary | ICD-10-CM | POA: Diagnosis not present

## 2017-10-13 DIAGNOSIS — E7800 Pure hypercholesterolemia, unspecified: Secondary | ICD-10-CM | POA: Diagnosis not present

## 2017-10-13 DIAGNOSIS — I10 Essential (primary) hypertension: Secondary | ICD-10-CM | POA: Diagnosis not present

## 2017-10-13 DIAGNOSIS — R7303 Prediabetes: Secondary | ICD-10-CM | POA: Diagnosis not present

## 2017-10-20 DIAGNOSIS — I10 Essential (primary) hypertension: Secondary | ICD-10-CM | POA: Diagnosis not present

## 2017-10-20 DIAGNOSIS — E7800 Pure hypercholesterolemia, unspecified: Secondary | ICD-10-CM | POA: Diagnosis not present

## 2017-10-20 DIAGNOSIS — R7303 Prediabetes: Secondary | ICD-10-CM | POA: Diagnosis not present

## 2017-10-20 DIAGNOSIS — K219 Gastro-esophageal reflux disease without esophagitis: Secondary | ICD-10-CM | POA: Diagnosis not present

## 2017-10-20 DIAGNOSIS — M5417 Radiculopathy, lumbosacral region: Secondary | ICD-10-CM | POA: Diagnosis not present

## 2017-10-26 DIAGNOSIS — H35362 Drusen (degenerative) of macula, left eye: Secondary | ICD-10-CM | POA: Diagnosis not present

## 2017-10-26 DIAGNOSIS — H34831 Tributary (branch) retinal vein occlusion, right eye, with macular edema: Secondary | ICD-10-CM | POA: Diagnosis not present

## 2017-10-26 DIAGNOSIS — H43822 Vitreomacular adhesion, left eye: Secondary | ICD-10-CM | POA: Diagnosis not present

## 2017-10-26 DIAGNOSIS — H35033 Hypertensive retinopathy, bilateral: Secondary | ICD-10-CM | POA: Diagnosis not present

## 2017-11-02 DIAGNOSIS — J449 Chronic obstructive pulmonary disease, unspecified: Secondary | ICD-10-CM | POA: Diagnosis not present

## 2017-11-05 DIAGNOSIS — J449 Chronic obstructive pulmonary disease, unspecified: Secondary | ICD-10-CM | POA: Diagnosis not present

## 2017-12-02 DIAGNOSIS — J449 Chronic obstructive pulmonary disease, unspecified: Secondary | ICD-10-CM | POA: Diagnosis not present

## 2017-12-05 DIAGNOSIS — J449 Chronic obstructive pulmonary disease, unspecified: Secondary | ICD-10-CM | POA: Diagnosis not present

## 2017-12-07 DIAGNOSIS — H43822 Vitreomacular adhesion, left eye: Secondary | ICD-10-CM | POA: Diagnosis not present

## 2017-12-07 DIAGNOSIS — H34831 Tributary (branch) retinal vein occlusion, right eye, with macular edema: Secondary | ICD-10-CM | POA: Diagnosis not present

## 2017-12-07 DIAGNOSIS — H35033 Hypertensive retinopathy, bilateral: Secondary | ICD-10-CM | POA: Diagnosis not present

## 2017-12-07 DIAGNOSIS — H3582 Retinal ischemia: Secondary | ICD-10-CM | POA: Diagnosis not present

## 2017-12-15 DIAGNOSIS — Z79899 Other long term (current) drug therapy: Secondary | ICD-10-CM | POA: Diagnosis not present

## 2017-12-15 DIAGNOSIS — I7300 Raynaud's syndrome without gangrene: Secondary | ICD-10-CM | POA: Diagnosis not present

## 2017-12-15 DIAGNOSIS — J849 Interstitial pulmonary disease, unspecified: Secondary | ICD-10-CM | POA: Diagnosis not present

## 2017-12-15 DIAGNOSIS — M351 Other overlap syndromes: Secondary | ICD-10-CM | POA: Diagnosis not present

## 2017-12-22 DIAGNOSIS — Z79899 Other long term (current) drug therapy: Secondary | ICD-10-CM | POA: Diagnosis not present

## 2017-12-22 DIAGNOSIS — M351 Other overlap syndromes: Secondary | ICD-10-CM | POA: Diagnosis not present

## 2017-12-22 DIAGNOSIS — J849 Interstitial pulmonary disease, unspecified: Secondary | ICD-10-CM | POA: Diagnosis not present

## 2017-12-22 DIAGNOSIS — I7300 Raynaud's syndrome without gangrene: Secondary | ICD-10-CM | POA: Diagnosis not present

## 2017-12-29 DIAGNOSIS — J8410 Pulmonary fibrosis, unspecified: Secondary | ICD-10-CM | POA: Diagnosis not present

## 2017-12-29 DIAGNOSIS — Z23 Encounter for immunization: Secondary | ICD-10-CM | POA: Diagnosis not present

## 2017-12-29 DIAGNOSIS — M349 Systemic sclerosis, unspecified: Secondary | ICD-10-CM | POA: Diagnosis not present

## 2017-12-29 DIAGNOSIS — Z9981 Dependence on supplemental oxygen: Secondary | ICD-10-CM | POA: Diagnosis not present

## 2017-12-29 DIAGNOSIS — R0609 Other forms of dyspnea: Secondary | ICD-10-CM | POA: Diagnosis not present

## 2018-01-02 DIAGNOSIS — J449 Chronic obstructive pulmonary disease, unspecified: Secondary | ICD-10-CM | POA: Diagnosis not present

## 2018-01-05 DIAGNOSIS — J449 Chronic obstructive pulmonary disease, unspecified: Secondary | ICD-10-CM | POA: Diagnosis not present

## 2018-01-14 DIAGNOSIS — H43822 Vitreomacular adhesion, left eye: Secondary | ICD-10-CM | POA: Diagnosis not present

## 2018-01-14 DIAGNOSIS — H35033 Hypertensive retinopathy, bilateral: Secondary | ICD-10-CM | POA: Diagnosis not present

## 2018-01-14 DIAGNOSIS — H34831 Tributary (branch) retinal vein occlusion, right eye, with macular edema: Secondary | ICD-10-CM | POA: Diagnosis not present

## 2018-01-14 DIAGNOSIS — H3582 Retinal ischemia: Secondary | ICD-10-CM | POA: Diagnosis not present

## 2018-02-01 DIAGNOSIS — J449 Chronic obstructive pulmonary disease, unspecified: Secondary | ICD-10-CM | POA: Diagnosis not present

## 2018-02-04 DIAGNOSIS — J449 Chronic obstructive pulmonary disease, unspecified: Secondary | ICD-10-CM | POA: Diagnosis not present

## 2018-02-18 DIAGNOSIS — H6123 Impacted cerumen, bilateral: Secondary | ICD-10-CM | POA: Diagnosis not present

## 2018-03-01 DIAGNOSIS — H34831 Tributary (branch) retinal vein occlusion, right eye, with macular edema: Secondary | ICD-10-CM | POA: Diagnosis not present

## 2018-03-01 DIAGNOSIS — H35413 Lattice degeneration of retina, bilateral: Secondary | ICD-10-CM | POA: Diagnosis not present

## 2018-03-01 DIAGNOSIS — H35033 Hypertensive retinopathy, bilateral: Secondary | ICD-10-CM | POA: Diagnosis not present

## 2018-03-01 DIAGNOSIS — H43822 Vitreomacular adhesion, left eye: Secondary | ICD-10-CM | POA: Diagnosis not present

## 2018-03-02 DIAGNOSIS — J8410 Pulmonary fibrosis, unspecified: Secondary | ICD-10-CM | POA: Diagnosis not present

## 2018-03-02 DIAGNOSIS — I10 Essential (primary) hypertension: Secondary | ICD-10-CM | POA: Diagnosis not present

## 2018-03-02 DIAGNOSIS — I272 Pulmonary hypertension, unspecified: Secondary | ICD-10-CM | POA: Diagnosis not present

## 2018-03-02 DIAGNOSIS — R7303 Prediabetes: Secondary | ICD-10-CM | POA: Diagnosis not present

## 2018-03-02 DIAGNOSIS — E7800 Pure hypercholesterolemia, unspecified: Secondary | ICD-10-CM | POA: Diagnosis not present

## 2018-03-04 DIAGNOSIS — J449 Chronic obstructive pulmonary disease, unspecified: Secondary | ICD-10-CM | POA: Diagnosis not present

## 2018-03-07 DIAGNOSIS — J449 Chronic obstructive pulmonary disease, unspecified: Secondary | ICD-10-CM | POA: Diagnosis not present

## 2018-03-23 DIAGNOSIS — I7300 Raynaud's syndrome without gangrene: Secondary | ICD-10-CM | POA: Diagnosis not present

## 2018-03-23 DIAGNOSIS — M351 Other overlap syndromes: Secondary | ICD-10-CM | POA: Diagnosis not present

## 2018-03-23 DIAGNOSIS — Z79899 Other long term (current) drug therapy: Secondary | ICD-10-CM | POA: Diagnosis not present

## 2018-03-23 DIAGNOSIS — J849 Interstitial pulmonary disease, unspecified: Secondary | ICD-10-CM | POA: Diagnosis not present

## 2018-04-04 DIAGNOSIS — J449 Chronic obstructive pulmonary disease, unspecified: Secondary | ICD-10-CM | POA: Diagnosis not present

## 2018-04-07 DIAGNOSIS — J449 Chronic obstructive pulmonary disease, unspecified: Secondary | ICD-10-CM | POA: Diagnosis not present

## 2018-04-12 DIAGNOSIS — H43822 Vitreomacular adhesion, left eye: Secondary | ICD-10-CM | POA: Diagnosis not present

## 2018-04-12 DIAGNOSIS — H34831 Tributary (branch) retinal vein occlusion, right eye, with macular edema: Secondary | ICD-10-CM | POA: Diagnosis not present

## 2018-04-12 DIAGNOSIS — H35033 Hypertensive retinopathy, bilateral: Secondary | ICD-10-CM | POA: Diagnosis not present

## 2018-04-12 DIAGNOSIS — H43813 Vitreous degeneration, bilateral: Secondary | ICD-10-CM | POA: Diagnosis not present

## 2018-04-20 DIAGNOSIS — K219 Gastro-esophageal reflux disease without esophagitis: Secondary | ICD-10-CM | POA: Diagnosis not present

## 2018-04-20 DIAGNOSIS — E7800 Pure hypercholesterolemia, unspecified: Secondary | ICD-10-CM | POA: Diagnosis not present

## 2018-04-20 DIAGNOSIS — I10 Essential (primary) hypertension: Secondary | ICD-10-CM | POA: Diagnosis not present

## 2018-04-20 DIAGNOSIS — R7303 Prediabetes: Secondary | ICD-10-CM | POA: Diagnosis not present

## 2018-04-27 DIAGNOSIS — Z0000 Encounter for general adult medical examination without abnormal findings: Secondary | ICD-10-CM | POA: Diagnosis not present

## 2018-04-27 DIAGNOSIS — E7800 Pure hypercholesterolemia, unspecified: Secondary | ICD-10-CM | POA: Diagnosis not present

## 2018-04-27 DIAGNOSIS — I10 Essential (primary) hypertension: Secondary | ICD-10-CM | POA: Diagnosis not present

## 2018-04-27 DIAGNOSIS — Z23 Encounter for immunization: Secondary | ICD-10-CM | POA: Diagnosis not present

## 2018-04-27 DIAGNOSIS — R7303 Prediabetes: Secondary | ICD-10-CM | POA: Diagnosis not present

## 2018-05-04 DIAGNOSIS — J449 Chronic obstructive pulmonary disease, unspecified: Secondary | ICD-10-CM | POA: Diagnosis not present

## 2018-05-07 DIAGNOSIS — J449 Chronic obstructive pulmonary disease, unspecified: Secondary | ICD-10-CM | POA: Diagnosis not present

## 2018-05-24 DIAGNOSIS — H34831 Tributary (branch) retinal vein occlusion, right eye, with macular edema: Secondary | ICD-10-CM | POA: Diagnosis not present

## 2018-05-24 DIAGNOSIS — H35413 Lattice degeneration of retina, bilateral: Secondary | ICD-10-CM | POA: Diagnosis not present

## 2018-05-24 DIAGNOSIS — H3582 Retinal ischemia: Secondary | ICD-10-CM | POA: Diagnosis not present

## 2018-05-24 DIAGNOSIS — H35033 Hypertensive retinopathy, bilateral: Secondary | ICD-10-CM | POA: Diagnosis not present

## 2018-06-04 DIAGNOSIS — J449 Chronic obstructive pulmonary disease, unspecified: Secondary | ICD-10-CM | POA: Diagnosis not present

## 2018-06-07 DIAGNOSIS — J449 Chronic obstructive pulmonary disease, unspecified: Secondary | ICD-10-CM | POA: Diagnosis not present

## 2018-06-14 ENCOUNTER — Other Ambulatory Visit: Payer: Self-pay | Admitting: Obstetrics & Gynecology

## 2018-06-14 ENCOUNTER — Telehealth: Payer: Self-pay

## 2018-06-14 NOTE — Telephone Encounter (Signed)
Pt would like orders for a mammogram to be called in to [place].  She has appt c [place] 12/23.  Needs mammogram before the end of the year. [phone]

## 2018-06-15 ENCOUNTER — Other Ambulatory Visit: Payer: Self-pay | Admitting: Obstetrics & Gynecology

## 2018-06-15 DIAGNOSIS — J849 Interstitial pulmonary disease, unspecified: Secondary | ICD-10-CM | POA: Diagnosis not present

## 2018-06-15 DIAGNOSIS — Z1239 Encounter for other screening for malignant neoplasm of breast: Secondary | ICD-10-CM

## 2018-06-15 DIAGNOSIS — I7300 Raynaud's syndrome without gangrene: Secondary | ICD-10-CM | POA: Diagnosis not present

## 2018-06-15 DIAGNOSIS — M351 Other overlap syndromes: Secondary | ICD-10-CM | POA: Diagnosis not present

## 2018-06-15 DIAGNOSIS — Z79899 Other long term (current) drug therapy: Secondary | ICD-10-CM | POA: Diagnosis not present

## 2018-06-15 NOTE — Telephone Encounter (Signed)
Ordered

## 2018-06-15 NOTE — Telephone Encounter (Signed)
Pt aware

## 2018-06-22 DIAGNOSIS — M351 Other overlap syndromes: Secondary | ICD-10-CM | POA: Diagnosis not present

## 2018-06-22 DIAGNOSIS — J849 Interstitial pulmonary disease, unspecified: Secondary | ICD-10-CM | POA: Diagnosis not present

## 2018-06-22 DIAGNOSIS — I7300 Raynaud's syndrome without gangrene: Secondary | ICD-10-CM | POA: Diagnosis not present

## 2018-07-04 DIAGNOSIS — J449 Chronic obstructive pulmonary disease, unspecified: Secondary | ICD-10-CM | POA: Diagnosis not present

## 2018-07-05 DIAGNOSIS — H35033 Hypertensive retinopathy, bilateral: Secondary | ICD-10-CM | POA: Diagnosis not present

## 2018-07-05 DIAGNOSIS — H35413 Lattice degeneration of retina, bilateral: Secondary | ICD-10-CM | POA: Diagnosis not present

## 2018-07-05 DIAGNOSIS — H3582 Retinal ischemia: Secondary | ICD-10-CM | POA: Diagnosis not present

## 2018-07-05 DIAGNOSIS — H34831 Tributary (branch) retinal vein occlusion, right eye, with macular edema: Secondary | ICD-10-CM | POA: Diagnosis not present

## 2018-07-07 DIAGNOSIS — J449 Chronic obstructive pulmonary disease, unspecified: Secondary | ICD-10-CM | POA: Diagnosis not present

## 2018-07-13 DIAGNOSIS — R0609 Other forms of dyspnea: Secondary | ICD-10-CM | POA: Diagnosis not present

## 2018-07-13 DIAGNOSIS — I272 Pulmonary hypertension, unspecified: Secondary | ICD-10-CM | POA: Diagnosis not present

## 2018-07-13 DIAGNOSIS — J849 Interstitial pulmonary disease, unspecified: Secondary | ICD-10-CM | POA: Diagnosis not present

## 2018-07-13 DIAGNOSIS — M349 Systemic sclerosis, unspecified: Secondary | ICD-10-CM | POA: Diagnosis not present

## 2018-07-19 ENCOUNTER — Encounter: Payer: Self-pay | Admitting: Obstetrics & Gynecology

## 2018-07-19 ENCOUNTER — Other Ambulatory Visit (HOSPITAL_COMMUNITY)
Admission: RE | Admit: 2018-07-19 | Discharge: 2018-07-19 | Disposition: A | Discharge status: Home / Self Care | Payer: Medicare HMO | Source: Ambulatory Visit | Attending: Obstetrics & Gynecology | Admitting: Obstetrics & Gynecology

## 2018-07-19 ENCOUNTER — Ambulatory Visit (INDEPENDENT_AMBULATORY_CARE_PROVIDER_SITE_OTHER): Payer: Medicare HMO | Admitting: Obstetrics & Gynecology

## 2018-07-19 VITALS — BP 140/90 | Ht 65.0 in | Wt 166.0 lb

## 2018-07-19 DIAGNOSIS — Z01419 Encounter for gynecological examination (general) (routine) without abnormal findings: Secondary | ICD-10-CM | POA: Diagnosis not present

## 2018-07-19 DIAGNOSIS — Z1211 Encounter for screening for malignant neoplasm of colon: Secondary | ICD-10-CM

## 2018-07-19 DIAGNOSIS — Z124 Encounter for screening for malignant neoplasm of cervix: Secondary | ICD-10-CM | POA: Insufficient documentation

## 2018-07-19 DIAGNOSIS — Z1239 Encounter for other screening for malignant neoplasm of breast: Secondary | ICD-10-CM

## 2018-07-19 NOTE — Progress Notes (Signed)
HPI:      [name] is a 57 y.o. G1P1 who LMP was in the past, she presents today for her annual examination.  The patient has no complaints today. The patient is sexually active. Herlast pap: approximate date 2016 and was normal and last mammogram: approximate date 2018 and was normal.  The patient does perform self breast exams.  There is no notable family history of breast or ovarian cancer in her family. The patient is not taking hormone replacement therapy. Patient denies post-menopausal vaginal bleeding.   The patient has regular exercise: yes. The patient denies current symptoms of depression.    GYN Hx: Last Colonoscopy:4 years ago. Normal.  Last DEXA: never ago.    PMHx: Past Medical History:  Diagnosis Date  . COPD (chronic obstructive pulmonary disease) ([place])   . Diabetes mellitus without complication ([place])   . Discoid lupus   . Eczema   . Esophageal dysmotility   . Genital herpes   . GERD (gastroesophageal reflux disease)   . HSV (herpes simplex virus) infection   . Hypertension   . Lumbago   . Lumbar disc disease   . Peripheral neuropathy   . Pulmonary fibrosis ([place])   . Pulmonary hypertension ([place])   . Raynaud's disease   . Sclerodactyly   . Scleroderma ([place])   . Spinal stenosis    Past Surgical History:  Procedure Laterality Date  . bone removal from pinkie toe    . DILATION AND CURETTAGE OF UTERUS    . SPLENECTOMY    . TUBAL LIGATION     Family History  Problem Relation Age of Onset  . Uterine cancer Mother   . Stroke Father   . Breast cancer Neg Hx    Social History   Tobacco Use  . Smoking status: Former Smoker    Packs/day: 0.50    Types: Cigarettes    Last attempt to quit: 01/08/2011    Years since quitting: 7.5  . Smokeless tobacco: Never Used  Substance Use Topics  . Alcohol use: No  . Drug use: No    Current Outpatient Medications:  .  amLODipine (NORVASC) 5 MG tablet, Take 5 mg by mouth daily., Disp: , Rfl:  .  aspirin 81 MG  tablet, Take 81 mg by mouth daily., Disp: , Rfl:  .  gabapentin (NEURONTIN) 600 MG tablet, Take 600 mg by mouth 4 (four) times daily., Disp: , Rfl:  .  Multiple Vitamins-Minerals (MULTIVITAMIN WITH MINERALS) tablet, Take 1 tablet by mouth daily., Disp: , Rfl:  .  mycophenolate (CELLCEPT) 500 MG tablet, Take 500 mg by mouth 2 (two) times daily., Disp: , Rfl:  .  omeprazole (PRILOSEC) 20 MG capsule, Take 20 mg by mouth daily., Disp: , Rfl:  Allergies: Hydroxychloroquine  Review of Systems  Constitutional: Negative for chills, fever and malaise/fatigue.  HENT: Negative for congestion, sinus pain and sore throat.   Eyes: Negative for blurred vision and pain.  Respiratory: Negative for cough and wheezing.   Cardiovascular: Negative for chest pain and leg swelling.  Gastrointestinal: Negative for abdominal pain, constipation, diarrhea, heartburn, nausea and vomiting.  Genitourinary: Negative for dysuria, frequency, hematuria and urgency.  Musculoskeletal: Negative for back pain, joint pain, myalgias and neck pain.  Skin: Negative for itching and rash.  Neurological: Negative for dizziness, tremors and weakness.  Endo/Heme/Allergies: Does not bruise/bleed easily.  Psychiatric/Behavioral: Negative for depression. The patient is not nervous/anxious and does not have insomnia.     Objective: BP  140/90   Ht _0  (1.651 m)   Wt 166 lb (75.3 kg)   BMI 27.62 kg/m   Filed Weights   07/19/18 1034  Weight: 166 lb (75.3 kg)   Body mass index is 27.62 kg/m. Physical Exam Constitutional:      General: She is not in acute distress.    Appearance: She is well-developed.  Genitourinary:     Pelvic exam was performed with patient supine.     Vagina, uterus and rectum normal.     No lesions in the vagina.     No vaginal bleeding.     No cervical motion tenderness, friability, lesion or polyp.     Uterus is mobile.     Uterus is not enlarged.     No uterine mass detected.    Uterus is midaxial.       No right or left adnexal mass present.     Right adnexa not tender.     Left adnexa not tender.  HENT:     Head: Normocephalic and atraumatic. No laceration.     Right Ear: Hearing normal.     Left Ear: Hearing normal.     Mouth/Throat:     Pharynx: Uvula midline.  Eyes:     Pupils: Pupils are equal, round, and reactive to light.  Neck:     Musculoskeletal: Normal range of motion and neck supple.     Thyroid: No thyromegaly.  Cardiovascular:     Rate and Rhythm: Normal rate and regular rhythm.     Heart sounds: No murmur. No friction rub. No gallop.   Pulmonary:     Effort: Pulmonary effort is normal. No respiratory distress.     Breath sounds: Normal breath sounds. No wheezing.  Chest:     Breasts:        Right: No mass, skin change or tenderness.        Left: No mass, skin change or tenderness.  Abdominal:     General: Bowel sounds are normal. There is no distension.     Palpations: Abdomen is soft.     Tenderness: There is no abdominal tenderness. There is no rebound.  Musculoskeletal: Normal range of motion.  Neurological:     Mental Status: She is alert and oriented to person, place, and time.     Cranial Nerves: No cranial nerve deficit.  Skin:    General: Skin is warm and dry.  Psychiatric:        Judgment: Judgment normal.  Vitals signs reviewed.     Assessment: Annual Exam 1. Women's annual routine gynecological examination   2. Screening for breast cancer   3. Screening for cervical cancer   4. Screen for colon cancer     Plan:            1.  Cervical Screening-  Pap smear done today  2. Breast screening- Exam annually and mammogram scheduled  3. Colonoscopy every 10 years, Hemoccult testing after age 47  4. Labs managed by PCP  5. Counseling for hormonal therapy: none     F/U  Return in about 1 year (around 07/20/2019) for Annual.  Barnett Applebaum, MD, [facility], [facility] 07/19/2018  11:01 AM

## 2018-07-19 NOTE — Patient Instructions (Signed)
PAP every three years Mammogram every year    Call [phone] to schedule at [facility] Colonoscopy every 10 years Labs yearly (with PCP) 

## 2018-07-23 ENCOUNTER — Ambulatory Visit
Admission: RE | Admit: 2018-07-23 | Discharge: 2018-07-23 | Disposition: A | Discharge status: Home / Self Care | Payer: Medicare HMO | Source: Ambulatory Visit | Attending: Obstetrics & Gynecology | Admitting: Obstetrics & Gynecology

## 2018-07-23 DIAGNOSIS — Z1239 Encounter for other screening for malignant neoplasm of breast: Secondary | ICD-10-CM

## 2018-07-23 DIAGNOSIS — Z1231 Encounter for screening mammogram for malignant neoplasm of breast: Secondary | ICD-10-CM | POA: Insufficient documentation

## 2018-07-26 ENCOUNTER — Encounter: Payer: Self-pay | Admitting: Obstetrics & Gynecology

## 2018-07-26 LAB — CYTOLOGY - PAP: Diagnosis: NEGATIVE

## 2018-08-02 DIAGNOSIS — J40 Bronchitis, not specified as acute or chronic: Secondary | ICD-10-CM | POA: Diagnosis not present

## 2018-08-02 DIAGNOSIS — R0609 Other forms of dyspnea: Secondary | ICD-10-CM | POA: Diagnosis not present

## 2018-08-02 DIAGNOSIS — K529 Noninfective gastroenteritis and colitis, unspecified: Secondary | ICD-10-CM | POA: Diagnosis not present

## 2018-08-02 DIAGNOSIS — I272 Pulmonary hypertension, unspecified: Secondary | ICD-10-CM | POA: Diagnosis not present

## 2018-08-04 DIAGNOSIS — J449 Chronic obstructive pulmonary disease, unspecified: Secondary | ICD-10-CM | POA: Diagnosis not present

## 2018-08-07 DIAGNOSIS — J449 Chronic obstructive pulmonary disease, unspecified: Secondary | ICD-10-CM | POA: Diagnosis not present

## 2018-08-10 DIAGNOSIS — R0609 Other forms of dyspnea: Secondary | ICD-10-CM | POA: Diagnosis not present

## 2018-08-10 DIAGNOSIS — I272 Pulmonary hypertension, unspecified: Secondary | ICD-10-CM | POA: Diagnosis not present

## 2018-08-10 DIAGNOSIS — A000 Cholera due to Vibrio cholerae 01, biovar cholerae: Secondary | ICD-10-CM | POA: Diagnosis not present

## 2018-08-10 DIAGNOSIS — J849 Interstitial pulmonary disease, unspecified: Secondary | ICD-10-CM | POA: Diagnosis not present

## 2018-08-16 DIAGNOSIS — H35033 Hypertensive retinopathy, bilateral: Secondary | ICD-10-CM | POA: Diagnosis not present

## 2018-08-16 DIAGNOSIS — H35413 Lattice degeneration of retina, bilateral: Secondary | ICD-10-CM | POA: Diagnosis not present

## 2018-08-16 DIAGNOSIS — H34831 Tributary (branch) retinal vein occlusion, right eye, with macular edema: Secondary | ICD-10-CM | POA: Diagnosis not present

## 2018-08-16 DIAGNOSIS — H43822 Vitreomacular adhesion, left eye: Secondary | ICD-10-CM | POA: Diagnosis not present

## 2018-08-23 DIAGNOSIS — E7800 Pure hypercholesterolemia, unspecified: Secondary | ICD-10-CM | POA: Diagnosis not present

## 2018-08-23 DIAGNOSIS — I10 Essential (primary) hypertension: Secondary | ICD-10-CM | POA: Diagnosis not present

## 2018-08-23 DIAGNOSIS — R7303 Prediabetes: Secondary | ICD-10-CM | POA: Diagnosis not present

## 2018-08-23 DIAGNOSIS — J8410 Pulmonary fibrosis, unspecified: Secondary | ICD-10-CM | POA: Diagnosis not present

## 2018-08-23 DIAGNOSIS — I272 Pulmonary hypertension, unspecified: Secondary | ICD-10-CM | POA: Diagnosis not present

## 2018-09-07 DIAGNOSIS — J449 Chronic obstructive pulmonary disease, unspecified: Secondary | ICD-10-CM | POA: Diagnosis not present

## 2018-09-10 DIAGNOSIS — A000 Cholera due to Vibrio cholerae 01, biovar cholerae: Secondary | ICD-10-CM | POA: Diagnosis not present

## 2018-09-14 ENCOUNTER — Other Ambulatory Visit: Payer: Self-pay | Admitting: Cardiology

## 2018-09-17 ENCOUNTER — Encounter: Payer: Self-pay | Admitting: *Deleted

## 2018-09-17 ENCOUNTER — Encounter
Admission: RE | Disposition: A | Discharge status: Home / Self Care | Payer: Self-pay | Source: Home / Self Care | Attending: Cardiology

## 2018-09-17 ENCOUNTER — Other Ambulatory Visit: Payer: Self-pay

## 2018-09-17 ENCOUNTER — Ambulatory Visit
Admission: RE | Admit: 2018-09-17 | Discharge: 2018-09-17 | Disposition: A | Discharge status: Home / Self Care | Payer: Medicare HMO | Attending: Cardiology | Admitting: Cardiology

## 2018-09-17 DIAGNOSIS — K219 Gastro-esophageal reflux disease without esophagitis: Secondary | ICD-10-CM | POA: Insufficient documentation

## 2018-09-17 DIAGNOSIS — I272 Pulmonary hypertension, unspecified: Secondary | ICD-10-CM

## 2018-09-17 DIAGNOSIS — R7303 Prediabetes: Secondary | ICD-10-CM | POA: Diagnosis not present

## 2018-09-17 DIAGNOSIS — G629 Polyneuropathy, unspecified: Secondary | ICD-10-CM | POA: Insufficient documentation

## 2018-09-17 DIAGNOSIS — M329 Systemic lupus erythematosus, unspecified: Secondary | ICD-10-CM | POA: Diagnosis not present

## 2018-09-17 DIAGNOSIS — Z888 Allergy status to other drugs, medicaments and biological substances status: Secondary | ICD-10-CM | POA: Insufficient documentation

## 2018-09-17 DIAGNOSIS — I10 Essential (primary) hypertension: Secondary | ICD-10-CM | POA: Insufficient documentation

## 2018-09-17 DIAGNOSIS — Z9081 Acquired absence of spleen: Secondary | ICD-10-CM | POA: Insufficient documentation

## 2018-09-17 DIAGNOSIS — M351 Other overlap syndromes: Secondary | ICD-10-CM | POA: Insufficient documentation

## 2018-09-17 DIAGNOSIS — J449 Chronic obstructive pulmonary disease, unspecified: Secondary | ICD-10-CM | POA: Diagnosis not present

## 2018-09-17 DIAGNOSIS — Z9851 Tubal ligation status: Secondary | ICD-10-CM | POA: Diagnosis not present

## 2018-09-17 DIAGNOSIS — Z7982 Long term (current) use of aspirin: Secondary | ICD-10-CM | POA: Insufficient documentation

## 2018-09-17 DIAGNOSIS — Z87891 Personal history of nicotine dependence: Secondary | ICD-10-CM | POA: Diagnosis not present

## 2018-09-17 DIAGNOSIS — Z79899 Other long term (current) drug therapy: Secondary | ICD-10-CM | POA: Insufficient documentation

## 2018-09-17 DIAGNOSIS — M349 Systemic sclerosis, unspecified: Secondary | ICD-10-CM | POA: Diagnosis not present

## 2018-09-17 DIAGNOSIS — I7300 Raynaud's syndrome without gangrene: Secondary | ICD-10-CM | POA: Diagnosis not present

## 2018-09-17 HISTORY — PX: RIGHT HEART CATH: CATH118263

## 2018-09-17 SURGERY — RIGHT HEART CATH
Anesthesia: Moderate Sedation | Laterality: Right

## 2018-09-17 MED ORDER — ASPIRIN 81 MG PO CHEW: 81.0000 mg | CHEWABLE_TABLET | ORAL | Status: DC

## 2018-09-17 MED ORDER — MIDAZOLAM HCL 2 MG/2ML IJ SOLN
INTRAMUSCULAR | Status: DC | PRN
  Administered 2018-09-17: 1 mg via INTRAVENOUS

## 2018-09-17 MED ORDER — SODIUM CHLORIDE 0.9 % IV SOLN: 250.0000 mL | INTRAVENOUS | Status: DC | PRN

## 2018-09-17 MED ORDER — HEPARIN (PORCINE) IN NACL 1000-0.9 UT/500ML-% IV SOLN
INTRAVENOUS | Status: DC | PRN
  Administered 2018-09-17: 500 mL

## 2018-09-17 MED ORDER — SODIUM CHLORIDE 0.9% FLUSH: 3.0000 mL | Freq: Two times a day (BID) | INTRAVENOUS | Status: DC

## 2018-09-17 MED ORDER — HEPARIN (PORCINE) IN NACL 1000-0.9 UT/500ML-% IV SOLN
INTRAVENOUS | Status: AC
  Filled 2018-09-17: qty 1000

## 2018-09-17 MED ORDER — MIDAZOLAM HCL 2 MG/2ML IJ SOLN
INTRAMUSCULAR | Status: AC
  Filled 2018-09-17: qty 2

## 2018-09-17 MED ORDER — FENTANYL CITRATE (PF) 100 MCG/2ML IJ SOLN
INTRAMUSCULAR | Status: AC
  Filled 2018-09-17: qty 2

## 2018-09-17 MED ORDER — FENTANYL CITRATE (PF) 100 MCG/2ML IJ SOLN
INTRAMUSCULAR | Status: DC | PRN
  Administered 2018-09-17: 25 ug via INTRAVENOUS

## 2018-09-17 MED ORDER — SODIUM CHLORIDE 0.9 % IV SOLN
INTRAVENOUS | Status: DC
  Administered 2018-09-17: 09:00:00 via INTRAVENOUS

## 2018-09-17 MED ORDER — SODIUM CHLORIDE 0.9% FLUSH: 3.0000 mL | INTRAVENOUS | Status: DC | PRN

## 2018-09-17 SURGICAL SUPPLY — 7 items
CATH SWANZ 7F THERMO (CATHETERS) ×3 IMPLANT
GUIDEWIRE EMER 3M J .025X150CM (WIRE) ×3 IMPLANT
KIT MANI 3VAL PERCEP (MISCELLANEOUS) ×3 IMPLANT
KIT RIGHT HEART (MISCELLANEOUS) ×3 IMPLANT
NEEDLE PERC 18GX7CM (NEEDLE) ×3 IMPLANT
PACK CARDIAC CATH (CUSTOM PROCEDURE TRAY) ×3 IMPLANT
SHEATH AVANTI 7FRX11 (SHEATH) ×3 IMPLANT

## 2018-09-17 NOTE — Progress Notes (Signed)
Dr. Ubaldo Glassing notified per Gwenlyn Found RN regarding no recent labs/EKG available in Epic. Per MD no additional testing needed at this time.

## 2018-09-17 NOTE — H&P (Signed)
Chief Complaint: Chief Complaint  Patient presents with  . discuss echo  Date of Service: 08/23/2018 Date of Birth: Apr 10, 1961 PCP: Dion Body, MD  History of Present Illness: Tammy Barrett is a 58 y.o.female patient who turns for follow-up visit. Has a history hypertension, pulmonary fibrosis, scleroderma and mixed connective tissue disease. Her connective tissue disease symptoms are being followed by Rheumatology. She is a history of mild pulmonary hypertension with pulmonary fibrosis. Right heart catheterization done in 2017 revealed mild pulmonary hypertension with a mean PA pressure 34 mmHg. Her echocardiogram at the time showed a peak systolic pressure in the 16X. Repeat echo done 1 to 2 weeks ago showed worsening pulmonary pressures. She is more short of breath. Her estimated peak TR velocity is 474 cm/s with a 93 mm peak RV pressure by echo.  Past Medical and Surgical History  Past Medical History Past Medical History:  Diagnosis Date  . Borderline diabetes  . COPD (chronic obstructive pulmonary disease) (CMS-[name])  . Discoid lupus  . Eczema, unspecified  . Esophageal dysmotility  . Essential hypertension  . Eye swelling, right 06/2016  behind retina and is receiving injections for it once a month  . Genital herpes  . GERD (gastroesophageal reflux disease)  . Lumbago  . Lumbar disc disease  . Lupus (CMS-[name])  . Peripheral neuropathy  . Pulmonary fibrosis (CMS-[name])  . Pulmonary hypertension (CMS-[name])  . Raynaud's disease  . Sclerodactyly  . Scleroderma (CMS-[name])  . Spinal stenosis   Past Surgical History She has a past surgical history that includes Splenectomy; Dilation and curettage, diagnostic / therapeutic; Bone removal from left pinkie toe; Tubal ligation; and flexible sigmoidoscopy (09/25/2017).   Medications and Allergies  Current Medications  Current Outpatient Medications  Medication Sig Dispense Refill  . amLODIPine (NORVASC) 5 MG tablet TAKE 1 TABLET  EVERY DAY 90 tablet 1  . aspirin 81 MG EC tablet Take 81 mg by mouth once daily.  [name] docusate (COLACE) 100 MG capsule Take 100 mg by mouth once daily  . gabapentin (NEURONTIN) 600 MG tablet Take 1 tablet (600 mg total) by mouth 3 (three) times daily 270 tablet 1  . methylcellulose (CITRUCEL ORAL) Take 1 tablet by mouth every other day  . multivitamin tablet Take 1 tablet by mouth once daily.  . mycophenolate (CELLCEPT) 500 mg tablet Take 1 tablet (500 mg total) by mouth 2 (two) times daily 180 tablet 1  . omeprazole (PRILOSEC) 20 MG DR capsule TAKE 1 CAPSULE TWICE DAILY 180 capsule 1   No current facility-administered medications for this visit.   Allergies: Hydroxychloroquine and Meclizine  Social and Family History  Social History reports that she quit smoking about 8 years ago. Her smoking use included cigarettes. She has a 15.00 pack-year smoking history. She has never used smokeless tobacco. She reports that she does not drink alcohol or use drugs.  Family History Family History  Problem Relation Age of Onset  . Cancer Mother  . Cancer Father  . No Known Problems Sister  . No Known Problems Brother   Review of Systems  Review of Systems  Constitutional: Negative for chills, diaphoresis, fever, malaise/fatigue and weight loss.  HENT: Negative for congestion, ear discharge, hearing loss and tinnitus.  Eyes: Negative for blurred vision.  Respiratory: Positive for shortness of breath. Negative for cough, hemoptysis, sputum production and wheezing.  Cardiovascular: Positive for leg swelling. Negative for palpitations, orthopnea, claudication and PND.  Gastrointestinal: Negative for abdominal pain, blood in stool, constipation, diarrhea, heartburn, melena,  nausea and vomiting.  Genitourinary: Negative for dysuria, frequency, hematuria and urgency.  Musculoskeletal: Negative for back pain, falls and myalgias.  Skin: Negative for itching and rash.  Neurological: Negative for  dizziness, tingling, focal weakness, loss of consciousness, weakness and headaches.  Endo/Heme/Allergies: Negative for polydipsia. Does not bruise/bleed easily.  Psychiatric/Behavioral: Negative for depression, memory loss and substance abuse. The patient is not nervous/anxious.    Physical Examination   Vitals:BP (!) 140/92  Pulse 84  Resp 16  Ht 165.1 cm (_0 )  Wt 74.8 kg (164 lb 14.5 oz)  BMI 27.44 kg/m  Ht:165.1 cm (_1 ) Wt:74.8 kg (164 lb 14.5 oz) QHU:TMLY surface area is 1.85 meters squared. Body mass index is 27.44 kg/m.  Wt Readings from Last 3 Encounters:  08/23/18 74.8 kg (164 lb 14.5 oz)  08/10/18 73.5 kg (162 lb)  08/02/18 73.5 kg (162 lb)   BP Readings from Last 3 Encounters:  08/23/18 (!) 140/92  08/10/18 132/88  08/02/18 132/76   General appearance appears in no acute distress  Head Mouth and Eye exam Normocephalic, without obvious abnormality, atraumatic Dentition is good Eyes appear anicteric   LUNGS Breath Sounds: Normal Percussion: Normal  CARDIOVASCULAR JVP CV wave: no HJR: no Elevation at 90 degrees: None Carotid Pulse: normal pulsation bilaterally Bruit: None Apex: apical impulse normal  Auscultation Rhythm: normal sinus rhythm S1: normal S2: normal Clicks: no Rub: no Murmurs: 2/6 medium pitched mid systolic blowing at lower left sternal border  Gallop: None ABDOMEN Liver enlargement: no Pulsatile aorta: no Ascites: no Bruits: no  EXTREMITIES Clubbing: no Edema: trace to 1+ bilateral pedal edema Pulses: peripheral pulses symmetrical Femoral Bruits: no Amputation: no SKIN Rash: no Cyanosis: no Embolic phemonenon: no Bruising: no NEURO Alert and Oriented to person, place and time: yes Non focal: yes  PSYCH: Pt appears to have normal affect  LABS REVIEWED Last 3 CBC results: Lab Results  Component Value Date  WBC 6.0 06/15/2018  WBC 6.2 04/20/2018  WBC 6.0 03/23/2018   Lab Results  Component Value Date   HGB 14.2 06/15/2018  HGB 13.9 04/20/2018  HGB 14.3 03/23/2018   Lab Results  Component Value Date  HCT 43.9 06/15/2018  HCT 42.8 04/20/2018  HCT 44.6 03/23/2018   Lab Results  Component Value Date  PLT 265 06/15/2018  PLT 257 04/20/2018  PLT 237 03/23/2018   Lab Results  Component Value Date  CREATININE 0.9 06/15/2018  BUN 16 04/20/2018  NA 141 04/20/2018  K 4.6 04/20/2018  CL 106 04/20/2018  CO2 31.9 04/20/2018   Lab Results  Component Value Date  HGBA1C 6.3 (H) 04/20/2018   Lab Results  Component Value Date  HDL 36.1 04/20/2018  HDL 39.3 10/13/2017  HDL 37.3 04/14/2017   Lab Results  Component Value Date  LDLCALC 110 04/20/2018  LDLCALC 124 10/13/2017  LDLCALC 119 04/14/2017   Lab Results  Component Value Date  TRIG 140 04/20/2018  TRIG 130 10/13/2017  TRIG 142 04/14/2017   Lab Results  Component Value Date  ALT 11 06/15/2018  AST 16 06/15/2018  ALKPHOS 52 04/20/2018   Diagnostic Studies Reviewed:  EKG EKG demonstrated normal sinus rhythm, nonspecific ST and T waves changes.  Assessment and Plan   58 y.o. female with  ICD-10-CM ICD-9-CM  1. History of hypertension-blood pressure is controlled with amlodipine. Will continue with this and dash diet. Z86.79 V12.59 ECG 12-lead  2. Essential hypertension-as per above I10 401.9  3. Borderline diabetes (A1c 6.1% - 2//25/16) - diet  controlled-Will continue with ADA diet. Hemoglobin A1c goal of less than 6 is recommended R73.09 790.29  4. Scleroderma - followed by Dr. Precious Reel. Evidence of pulmonary hypertension. Mild to moderate pulmonary hypertension. Echo suggested increasing estimated RV systolic pressure. Will repeat right heart cath to evaluate for significant change that would guide further therapy. Risk and benefits of this procedure were discussed with the patient. M34.9 710.1  5. Mixed connective tissue disease M35.1 710.8  6. Pulmonary hypertension-mild per right heart catheterization.  Mean PA pressure of 34.-As per above. I27.2 416.8  7. GERD without esophagitis K21.9 530.81  8. Pulmonary fibrosis - followed by Dr. Dorthula Matas per above. J84.10 515   Return if symptoms worsen or fail to improve.  These notes generated with voice recognition software. I apologize for typographical errors.  Sydnee Levans, MD  Pt seen and examined. No change from above.

## 2018-09-21 DIAGNOSIS — J849 Interstitial pulmonary disease, unspecified: Secondary | ICD-10-CM | POA: Diagnosis not present

## 2018-09-21 DIAGNOSIS — I7300 Raynaud's syndrome without gangrene: Secondary | ICD-10-CM | POA: Diagnosis not present

## 2018-09-21 DIAGNOSIS — M351 Other overlap syndromes: Secondary | ICD-10-CM | POA: Diagnosis not present

## 2018-10-04 DIAGNOSIS — H43822 Vitreomacular adhesion, left eye: Secondary | ICD-10-CM | POA: Diagnosis not present

## 2018-10-04 DIAGNOSIS — H34831 Tributary (branch) retinal vein occlusion, right eye, with macular edema: Secondary | ICD-10-CM | POA: Diagnosis not present

## 2018-10-04 DIAGNOSIS — H35413 Lattice degeneration of retina, bilateral: Secondary | ICD-10-CM | POA: Diagnosis not present

## 2018-10-04 DIAGNOSIS — H35033 Hypertensive retinopathy, bilateral: Secondary | ICD-10-CM | POA: Diagnosis not present

## 2018-10-05 DIAGNOSIS — R0609 Other forms of dyspnea: Secondary | ICD-10-CM | POA: Diagnosis not present

## 2018-10-05 DIAGNOSIS — M349 Systemic sclerosis, unspecified: Secondary | ICD-10-CM | POA: Diagnosis not present

## 2018-10-05 DIAGNOSIS — I272 Pulmonary hypertension, unspecified: Secondary | ICD-10-CM | POA: Diagnosis not present

## 2018-10-06 DIAGNOSIS — J449 Chronic obstructive pulmonary disease, unspecified: Secondary | ICD-10-CM | POA: Diagnosis not present

## 2018-10-09 DIAGNOSIS — A000 Cholera due to Vibrio cholerae 01, biovar cholerae: Secondary | ICD-10-CM | POA: Diagnosis not present

## 2018-10-26 DIAGNOSIS — I10 Essential (primary) hypertension: Secondary | ICD-10-CM | POA: Diagnosis not present

## 2018-10-26 DIAGNOSIS — E7800 Pure hypercholesterolemia, unspecified: Secondary | ICD-10-CM | POA: Diagnosis not present

## 2018-10-26 DIAGNOSIS — R7303 Prediabetes: Secondary | ICD-10-CM | POA: Diagnosis not present

## 2018-11-02 DIAGNOSIS — R7303 Prediabetes: Secondary | ICD-10-CM | POA: Diagnosis not present

## 2018-11-02 DIAGNOSIS — E7800 Pure hypercholesterolemia, unspecified: Secondary | ICD-10-CM | POA: Diagnosis not present

## 2018-11-02 DIAGNOSIS — Z0000 Encounter for general adult medical examination without abnormal findings: Secondary | ICD-10-CM | POA: Insufficient documentation

## 2018-11-02 DIAGNOSIS — I10 Essential (primary) hypertension: Secondary | ICD-10-CM | POA: Diagnosis not present

## 2018-11-06 DIAGNOSIS — J449 Chronic obstructive pulmonary disease, unspecified: Secondary | ICD-10-CM | POA: Diagnosis not present

## 2018-11-09 DIAGNOSIS — H34831 Tributary (branch) retinal vein occlusion, right eye, with macular edema: Secondary | ICD-10-CM | POA: Diagnosis not present

## 2018-11-09 DIAGNOSIS — A000 Cholera due to Vibrio cholerae 01, biovar cholerae: Secondary | ICD-10-CM | POA: Diagnosis not present

## 2018-12-09 DIAGNOSIS — A000 Cholera due to Vibrio cholerae 01, biovar cholerae: Secondary | ICD-10-CM | POA: Diagnosis not present

## 2018-12-21 DIAGNOSIS — J849 Interstitial pulmonary disease, unspecified: Secondary | ICD-10-CM | POA: Diagnosis not present

## 2018-12-21 DIAGNOSIS — M351 Other overlap syndromes: Secondary | ICD-10-CM | POA: Diagnosis not present

## 2018-12-21 DIAGNOSIS — I7300 Raynaud's syndrome without gangrene: Secondary | ICD-10-CM | POA: Diagnosis not present

## 2018-12-22 DIAGNOSIS — H34831 Tributary (branch) retinal vein occlusion, right eye, with macular edema: Secondary | ICD-10-CM | POA: Diagnosis not present

## 2018-12-28 DIAGNOSIS — Z79899 Other long term (current) drug therapy: Secondary | ICD-10-CM | POA: Diagnosis not present

## 2018-12-28 DIAGNOSIS — M351 Other overlap syndromes: Secondary | ICD-10-CM | POA: Diagnosis not present

## 2018-12-28 DIAGNOSIS — I7300 Raynaud's syndrome without gangrene: Secondary | ICD-10-CM | POA: Diagnosis not present

## 2018-12-28 DIAGNOSIS — M349 Systemic sclerosis, unspecified: Secondary | ICD-10-CM | POA: Diagnosis not present

## 2018-12-28 DIAGNOSIS — J849 Interstitial pulmonary disease, unspecified: Secondary | ICD-10-CM | POA: Diagnosis not present

## 2018-12-28 DIAGNOSIS — I2729 Other secondary pulmonary hypertension: Secondary | ICD-10-CM | POA: Diagnosis not present

## 2019-01-09 DIAGNOSIS — A000 Cholera due to Vibrio cholerae 01, biovar cholerae: Secondary | ICD-10-CM | POA: Diagnosis not present

## 2019-01-11 DIAGNOSIS — J8410 Pulmonary fibrosis, unspecified: Secondary | ICD-10-CM | POA: Diagnosis not present

## 2019-01-11 DIAGNOSIS — J984 Other disorders of lung: Secondary | ICD-10-CM | POA: Diagnosis not present

## 2019-01-11 DIAGNOSIS — J9859 Other diseases of mediastinum, not elsewhere classified: Secondary | ICD-10-CM | POA: Diagnosis not present

## 2019-01-11 DIAGNOSIS — I272 Pulmonary hypertension, unspecified: Secondary | ICD-10-CM | POA: Diagnosis not present

## 2019-01-11 DIAGNOSIS — M349 Systemic sclerosis, unspecified: Secondary | ICD-10-CM | POA: Diagnosis not present

## 2019-01-13 DIAGNOSIS — J449 Chronic obstructive pulmonary disease, unspecified: Secondary | ICD-10-CM | POA: Diagnosis not present

## 2019-02-02 DIAGNOSIS — H43822 Vitreomacular adhesion, left eye: Secondary | ICD-10-CM | POA: Diagnosis not present

## 2019-02-02 DIAGNOSIS — H35033 Hypertensive retinopathy, bilateral: Secondary | ICD-10-CM | POA: Diagnosis not present

## 2019-02-02 DIAGNOSIS — H34831 Tributary (branch) retinal vein occlusion, right eye, with macular edema: Secondary | ICD-10-CM | POA: Diagnosis not present

## 2019-02-02 DIAGNOSIS — H3582 Retinal ischemia: Secondary | ICD-10-CM | POA: Diagnosis not present

## 2019-02-12 DIAGNOSIS — J449 Chronic obstructive pulmonary disease, unspecified: Secondary | ICD-10-CM | POA: Diagnosis not present

## 2019-03-15 DIAGNOSIS — J449 Chronic obstructive pulmonary disease, unspecified: Secondary | ICD-10-CM | POA: Diagnosis not present

## 2019-03-29 DIAGNOSIS — M351 Other overlap syndromes: Secondary | ICD-10-CM | POA: Diagnosis not present

## 2019-03-29 DIAGNOSIS — Z79899 Other long term (current) drug therapy: Secondary | ICD-10-CM | POA: Diagnosis not present

## 2019-03-29 DIAGNOSIS — H938X3 Other specified disorders of ear, bilateral: Secondary | ICD-10-CM | POA: Diagnosis not present

## 2019-03-29 DIAGNOSIS — H6123 Impacted cerumen, bilateral: Secondary | ICD-10-CM | POA: Diagnosis not present

## 2019-03-30 DIAGNOSIS — H3582 Retinal ischemia: Secondary | ICD-10-CM | POA: Diagnosis not present

## 2019-03-30 DIAGNOSIS — H34831 Tributary (branch) retinal vein occlusion, right eye, with macular edema: Secondary | ICD-10-CM | POA: Diagnosis not present

## 2019-03-30 DIAGNOSIS — H43822 Vitreomacular adhesion, left eye: Secondary | ICD-10-CM | POA: Diagnosis not present

## 2019-03-30 DIAGNOSIS — H35413 Lattice degeneration of retina, bilateral: Secondary | ICD-10-CM | POA: Diagnosis not present

## 2019-04-15 DIAGNOSIS — J449 Chronic obstructive pulmonary disease, unspecified: Secondary | ICD-10-CM | POA: Diagnosis not present

## 2019-04-26 DIAGNOSIS — Z23 Encounter for immunization: Secondary | ICD-10-CM | POA: Diagnosis not present

## 2019-04-26 DIAGNOSIS — Z9981 Dependence on supplemental oxygen: Secondary | ICD-10-CM | POA: Diagnosis not present

## 2019-04-26 DIAGNOSIS — J8410 Pulmonary fibrosis, unspecified: Secondary | ICD-10-CM | POA: Diagnosis not present

## 2019-04-26 DIAGNOSIS — R0600 Dyspnea, unspecified: Secondary | ICD-10-CM | POA: Diagnosis not present

## 2019-05-03 DIAGNOSIS — E7800 Pure hypercholesterolemia, unspecified: Secondary | ICD-10-CM | POA: Diagnosis not present

## 2019-05-03 DIAGNOSIS — R7303 Prediabetes: Secondary | ICD-10-CM | POA: Diagnosis not present

## 2019-05-03 DIAGNOSIS — I10 Essential (primary) hypertension: Secondary | ICD-10-CM | POA: Diagnosis not present

## 2019-05-09 DIAGNOSIS — H35033 Hypertensive retinopathy, bilateral: Secondary | ICD-10-CM | POA: Diagnosis not present

## 2019-05-09 DIAGNOSIS — H43813 Vitreous degeneration, bilateral: Secondary | ICD-10-CM | POA: Diagnosis not present

## 2019-05-09 DIAGNOSIS — H3582 Retinal ischemia: Secondary | ICD-10-CM | POA: Diagnosis not present

## 2019-05-09 DIAGNOSIS — H34831 Tributary (branch) retinal vein occlusion, right eye, with macular edema: Secondary | ICD-10-CM | POA: Diagnosis not present

## 2019-05-10 DIAGNOSIS — R7303 Prediabetes: Secondary | ICD-10-CM | POA: Diagnosis not present

## 2019-05-10 DIAGNOSIS — K219 Gastro-esophageal reflux disease without esophagitis: Secondary | ICD-10-CM | POA: Diagnosis not present

## 2019-05-10 DIAGNOSIS — Z87891 Personal history of nicotine dependence: Secondary | ICD-10-CM | POA: Diagnosis not present

## 2019-05-10 DIAGNOSIS — E785 Hyperlipidemia, unspecified: Secondary | ICD-10-CM | POA: Diagnosis not present

## 2019-05-10 DIAGNOSIS — I10 Essential (primary) hypertension: Secondary | ICD-10-CM | POA: Diagnosis not present

## 2019-05-15 DIAGNOSIS — J449 Chronic obstructive pulmonary disease, unspecified: Secondary | ICD-10-CM | POA: Diagnosis not present

## 2019-06-14 ENCOUNTER — Other Ambulatory Visit: Payer: Self-pay | Admitting: Obstetrics & Gynecology

## 2019-06-14 DIAGNOSIS — Z1231 Encounter for screening mammogram for malignant neoplasm of breast: Secondary | ICD-10-CM

## 2019-06-15 DIAGNOSIS — J449 Chronic obstructive pulmonary disease, unspecified: Secondary | ICD-10-CM | POA: Diagnosis not present

## 2019-06-20 DIAGNOSIS — H34831 Tributary (branch) retinal vein occlusion, right eye, with macular edema: Secondary | ICD-10-CM | POA: Diagnosis not present

## 2019-06-28 DIAGNOSIS — M351 Other overlap syndromes: Secondary | ICD-10-CM | POA: Diagnosis not present

## 2019-06-28 DIAGNOSIS — Z79899 Other long term (current) drug therapy: Secondary | ICD-10-CM | POA: Diagnosis not present

## 2019-07-05 DIAGNOSIS — J849 Interstitial pulmonary disease, unspecified: Secondary | ICD-10-CM | POA: Diagnosis not present

## 2019-07-05 DIAGNOSIS — I2729 Other secondary pulmonary hypertension: Secondary | ICD-10-CM | POA: Diagnosis not present

## 2019-07-05 DIAGNOSIS — I7300 Raynaud's syndrome without gangrene: Secondary | ICD-10-CM | POA: Diagnosis not present

## 2019-07-05 DIAGNOSIS — M351 Other overlap syndromes: Secondary | ICD-10-CM | POA: Diagnosis not present

## 2019-07-05 DIAGNOSIS — M349 Systemic sclerosis, unspecified: Secondary | ICD-10-CM | POA: Diagnosis not present

## 2019-07-15 DIAGNOSIS — J449 Chronic obstructive pulmonary disease, unspecified: Secondary | ICD-10-CM | POA: Diagnosis not present

## 2019-07-18 DIAGNOSIS — R439 Unspecified disturbances of smell and taste: Secondary | ICD-10-CM | POA: Diagnosis not present

## 2019-07-19 ENCOUNTER — Other Ambulatory Visit: Payer: Self-pay | Admitting: Unknown Physician Specialty

## 2019-07-19 DIAGNOSIS — R430 Anosmia: Secondary | ICD-10-CM

## 2019-07-19 DIAGNOSIS — R439 Unspecified disturbances of smell and taste: Secondary | ICD-10-CM

## 2019-07-25 ENCOUNTER — Ambulatory Visit
Admission: RE | Admit: 2019-07-25 | Discharge: 2019-07-25 | Disposition: A | Discharge status: Home / Self Care | Payer: Medicare HMO | Source: Ambulatory Visit | Attending: Obstetrics & Gynecology | Admitting: Obstetrics & Gynecology

## 2019-07-25 ENCOUNTER — Ambulatory Visit (INDEPENDENT_AMBULATORY_CARE_PROVIDER_SITE_OTHER): Payer: Medicare HMO | Admitting: Obstetrics & Gynecology

## 2019-07-25 ENCOUNTER — Other Ambulatory Visit: Payer: Self-pay

## 2019-07-25 ENCOUNTER — Encounter: Payer: Self-pay | Admitting: Obstetrics & Gynecology

## 2019-07-25 VITALS — BP 140/80 | Ht 65.0 in | Wt 156.0 lb

## 2019-07-25 DIAGNOSIS — Z1231 Encounter for screening mammogram for malignant neoplasm of breast: Secondary | ICD-10-CM | POA: Insufficient documentation

## 2019-07-25 DIAGNOSIS — Z01419 Encounter for gynecological examination (general) (routine) without abnormal findings: Secondary | ICD-10-CM | POA: Diagnosis not present

## 2019-07-25 DIAGNOSIS — Z1211 Encounter for screening for malignant neoplasm of colon: Secondary | ICD-10-CM

## 2019-07-25 NOTE — Patient Instructions (Signed)
PAP every three years    Due 2022 Mammogram every year    Today Colonoscopy every 10 years Labs yearly (with PCP)

## 2019-07-25 NOTE — Progress Notes (Signed)
HPI:      [name] is a 58 y.o. G1P1 who LMP was in the past, she presents today for her annual examination.  The patient has no complaints today. The patient is sexually active. Herlast pap: approximate date 2019 and was normal and last mammogram: approximate date 2019 and was normal.  The patient does perform self breast exams.  There is no notable family history of breast or ovarian cancer in her family. The patient is not taking hormone replacement therapy. Patient denies post-menopausal vaginal bleeding.   The patient has regular exercise: yes. The patient denies current symptoms of depression.    GYN Hx: Last Colonoscopy:5 years ago. Normal.  Last DEXA: never ago.    PMHx: Past Medical History:  Diagnosis Date  . COPD (chronic obstructive pulmonary disease) ([place])   . Diabetes mellitus without complication ([place])   . Discoid lupus   . Eczema   . Esophageal dysmotility   . Genital herpes   . GERD (gastroesophageal reflux disease)   . HSV (herpes simplex virus) infection   . Hypertension   . Lumbago   . Lumbar disc disease   . Peripheral neuropathy   . Pulmonary fibrosis ([place])   . Pulmonary hypertension ([place])   . Raynaud's disease   . Sclerodactyly   . Scleroderma ([place])   . Spinal stenosis    Past Surgical History:  Procedure Laterality Date  . bone removal from pinkie toe    . DILATION AND CURETTAGE OF UTERUS    . RIGHT HEART CATH Right 09/17/2018   Procedure: RIGHT HEART CATH;  Surgeon: Teodoro Spray, MD;  Location: [facility];  Service: Cardiovascular;  Laterality: Right;  . SPLENECTOMY    . TUBAL LIGATION     Family History  Problem Relation Age of Onset  . Uterine cancer Mother   . Stroke Father   . Breast cancer Neg Hx    Social History   Tobacco Use  . Smoking status: Former Smoker    Packs/day: 0.50    Types: Cigarettes    Quit date: 01/08/2011    Years since quitting: 8.5  . Smokeless tobacco: Never Used  Substance Use Topics  .  Alcohol use: No  . Drug use: No    Current Outpatient Medications:  .  amLODipine (NORVASC) 5 MG tablet, Take 5 mg by mouth daily., Disp: , Rfl:  .  aspirin 81 MG tablet, Take 81 mg by mouth daily., Disp: , Rfl:  .  Carboxymethylcellul-Glycerin (LUBRICATING EYE DROPS OP), Place 1 drop into both eyes daily as needed (dry eyes)., Disp: , Rfl:  .  fluticasone (FLONASE) 50 MCG/ACT nasal spray, Place 1 spray into both nostrils daily as needed for allergies or rhinitis., Disp: , Rfl:  .  gabapentin (NEURONTIN) 600 MG tablet, Take 600 mg by mouth 3 (three) times daily. , Disp: , Rfl:  .  loratadine (CLARITIN) 10 MG tablet, Take 10 mg by mouth daily as needed for allergies., Disp: , Rfl:  .  Multiple Vitamins-Minerals (MULTIVITAMIN WITH MINERALS) tablet, Take 1 tablet by mouth daily., Disp: , Rfl:  .  mycophenolate (CELLCEPT) 500 MG tablet, Take 500 mg by mouth 2 (two) times daily., Disp: , Rfl:  .  naproxen sodium (ALEVE) 220 MG tablet, Take 220 mg by mouth daily as needed (pain)., Disp: , Rfl:  .  omeprazole (PRILOSEC) 20 MG capsule, Take 20 mg by mouth 2 (two) times daily. , Disp: , Rfl:  .  tadalafil, PAH, (ADCIRCA) 20 MG tablet, TAKE 1 TABLET (20 MG TOTAL) BY MOUTH ONCE DAILY, Disp: , Rfl:  Allergies: Hydroxychloroquine and Meclizine  Review of Systems  Constitutional: Negative for chills, fever and malaise/fatigue.  HENT: Negative for congestion, sinus pain and sore throat.   Eyes: Negative for blurred vision and pain.  Respiratory: Negative for cough and wheezing.   Cardiovascular: Negative for chest pain and leg swelling.  Gastrointestinal: Negative for abdominal pain, constipation, diarrhea, heartburn, nausea and vomiting.  Genitourinary: Negative for dysuria, frequency, hematuria and urgency.  Musculoskeletal: Negative for back pain, joint pain, myalgias and neck pain.  Skin: Negative for itching and rash.  Neurological: Negative for dizziness, tremors and weakness.   Endo/Heme/Allergies: Does not bruise/bleed easily.  Psychiatric/Behavioral: Negative for depression. The patient is not nervous/anxious and does not have insomnia.     Objective: BP 140/80   Ht _0  (1.651 m)   Wt 156 lb (70.8 kg)   BMI 25.96 kg/m   Filed Weights   07/25/19 0829  Weight: 156 lb (70.8 kg)   Body mass index is 25.96 kg/m. Physical Exam Constitutional:      General: She is not in acute distress.    Appearance: She is well-developed.  Genitourinary:     Pelvic exam was performed with patient supine.     Vagina, uterus and rectum normal.     No lesions in the vagina.     No vaginal bleeding.     No cervical motion tenderness, friability, lesion or polyp.     Uterus is mobile.     Uterus is not enlarged.     No uterine mass detected.    Uterus is midaxial.     No right or left adnexal mass present.     Right adnexa not tender.     Left adnexa not tender.  HENT:     Head: Normocephalic and atraumatic. No laceration.     Right Ear: Hearing normal.     Left Ear: Hearing normal.     Mouth/Throat:     Pharynx: Uvula midline.  Eyes:     Pupils: Pupils are equal, round, and reactive to light.  Neck:     Thyroid: No thyromegaly.  Cardiovascular:     Rate and Rhythm: Normal rate and regular rhythm.     Heart sounds: No murmur. No friction rub. No gallop.   Pulmonary:     Effort: Pulmonary effort is normal. No respiratory distress.     Breath sounds: Normal breath sounds. No wheezing.  Chest:     Breasts:        Right: No mass, skin change or tenderness.        Left: No mass, skin change or tenderness.  Abdominal:     General: Bowel sounds are normal. There is no distension.     Palpations: Abdomen is soft.     Tenderness: There is no abdominal tenderness. There is no rebound.  Musculoskeletal:        General: Normal range of motion.     Cervical back: Normal range of motion and neck supple.  Neurological:     Mental Status: She is alert and oriented to  person, place, and time.     Cranial Nerves: No cranial nerve deficit.  Skin:    General: Skin is warm and dry.  Psychiatric:        Judgment: Judgment normal.  Vitals reviewed.     Assessment: Annual Exam 1. Women's annual routine  gynecological examination   2. Screen for colon cancer     Plan:            1.  Cervical Screening-  Pap smear schedule reviewed with patient  2. Breast screening- Exam annually and mammogram scheduled  3. Colonoscopy every 10 years, Hemoccult testing after age 40  4. Labs managed by PCP  5. Counseling for hormonal therapy: none              6. FRAX - FRAX score for assessing the 10 year probability for fracture calculated and discussed today.  Based on age and score today, DEXA is not currently scheduled.    F/U  Return in about 1 year (around 07/24/2020) for Annual.  Barnett Applebaum, MD, [facility], [facility] 07/25/2019  8:51 AM

## 2019-08-02 ENCOUNTER — Ambulatory Visit: Payer: Medicare HMO

## 2019-08-02 DIAGNOSIS — H34831 Tributary (branch) retinal vein occlusion, right eye, with macular edema: Secondary | ICD-10-CM | POA: Diagnosis not present

## 2019-08-02 DIAGNOSIS — H43822 Vitreomacular adhesion, left eye: Secondary | ICD-10-CM | POA: Diagnosis not present

## 2019-08-08 ENCOUNTER — Other Ambulatory Visit: Payer: Self-pay

## 2019-08-08 ENCOUNTER — Ambulatory Visit
Admission: RE | Admit: 2019-08-08 | Discharge: 2019-08-08 | Disposition: A | Discharge status: Home / Self Care | Payer: Medicare HMO | Source: Ambulatory Visit | Attending: Unknown Physician Specialty | Admitting: Unknown Physician Specialty

## 2019-08-08 DIAGNOSIS — R439 Unspecified disturbances of smell and taste: Secondary | ICD-10-CM | POA: Insufficient documentation

## 2019-08-08 DIAGNOSIS — R430 Anosmia: Secondary | ICD-10-CM | POA: Insufficient documentation

## 2019-08-08 MED ORDER — GADOBUTROL 1 MMOL/ML IV SOLN
7.0000 mL | Freq: Once | INTRAVENOUS | Status: AC | PRN
  Administered 2019-08-08: 7 mL via INTRAVENOUS

## 2019-08-09 DIAGNOSIS — K6289 Other specified diseases of anus and rectum: Secondary | ICD-10-CM | POA: Diagnosis not present

## 2019-08-09 DIAGNOSIS — K649 Unspecified hemorrhoids: Secondary | ICD-10-CM | POA: Diagnosis not present

## 2019-08-09 DIAGNOSIS — R151 Fecal smearing: Secondary | ICD-10-CM | POA: Diagnosis not present

## 2019-08-09 DIAGNOSIS — K625 Hemorrhage of anus and rectum: Secondary | ICD-10-CM | POA: Diagnosis not present

## 2019-08-15 DIAGNOSIS — J449 Chronic obstructive pulmonary disease, unspecified: Secondary | ICD-10-CM | POA: Diagnosis not present

## 2019-08-25 DIAGNOSIS — R439 Unspecified disturbances of smell and taste: Secondary | ICD-10-CM | POA: Diagnosis not present

## 2019-08-25 DIAGNOSIS — E559 Vitamin D deficiency, unspecified: Secondary | ICD-10-CM | POA: Diagnosis not present

## 2019-08-25 DIAGNOSIS — M351 Other overlap syndromes: Secondary | ICD-10-CM | POA: Diagnosis not present

## 2019-08-25 DIAGNOSIS — E538 Deficiency of other specified B group vitamins: Secondary | ICD-10-CM | POA: Diagnosis not present

## 2019-08-27 DIAGNOSIS — Z1211 Encounter for screening for malignant neoplasm of colon: Secondary | ICD-10-CM | POA: Diagnosis not present

## 2019-09-03 LAB — FECAL OCCULT BLOOD, IMMUNOCHEMICAL: Fecal Occult Bld: NEGATIVE

## 2019-09-12 DIAGNOSIS — H34831 Tributary (branch) retinal vein occlusion, right eye, with macular edema: Secondary | ICD-10-CM | POA: Diagnosis not present

## 2019-09-12 DIAGNOSIS — H43813 Vitreous degeneration, bilateral: Secondary | ICD-10-CM | POA: Diagnosis not present

## 2019-09-12 DIAGNOSIS — H43822 Vitreomacular adhesion, left eye: Secondary | ICD-10-CM | POA: Diagnosis not present

## 2019-09-12 DIAGNOSIS — H35413 Lattice degeneration of retina, bilateral: Secondary | ICD-10-CM | POA: Diagnosis not present

## 2019-09-15 DIAGNOSIS — J449 Chronic obstructive pulmonary disease, unspecified: Secondary | ICD-10-CM | POA: Diagnosis not present

## 2019-09-20 DIAGNOSIS — I272 Pulmonary hypertension, unspecified: Secondary | ICD-10-CM | POA: Diagnosis not present

## 2019-09-20 DIAGNOSIS — M3489 Other systemic sclerosis: Secondary | ICD-10-CM | POA: Diagnosis not present

## 2019-09-20 DIAGNOSIS — R0602 Shortness of breath: Secondary | ICD-10-CM | POA: Diagnosis not present

## 2019-10-04 DIAGNOSIS — M351 Other overlap syndromes: Secondary | ICD-10-CM | POA: Diagnosis not present

## 2019-10-04 DIAGNOSIS — M349 Systemic sclerosis, unspecified: Secondary | ICD-10-CM | POA: Diagnosis not present

## 2019-10-04 DIAGNOSIS — I2729 Other secondary pulmonary hypertension: Secondary | ICD-10-CM | POA: Diagnosis not present

## 2019-10-04 DIAGNOSIS — I7300 Raynaud's syndrome without gangrene: Secondary | ICD-10-CM | POA: Diagnosis not present

## 2019-10-04 DIAGNOSIS — J849 Interstitial pulmonary disease, unspecified: Secondary | ICD-10-CM | POA: Diagnosis not present

## 2019-10-13 DIAGNOSIS — J449 Chronic obstructive pulmonary disease, unspecified: Secondary | ICD-10-CM | POA: Diagnosis not present

## 2019-10-24 DIAGNOSIS — H34831 Tributary (branch) retinal vein occlusion, right eye, with macular edema: Secondary | ICD-10-CM | POA: Diagnosis not present

## 2019-10-24 DIAGNOSIS — H43822 Vitreomacular adhesion, left eye: Secondary | ICD-10-CM | POA: Diagnosis not present

## 2019-11-01 DIAGNOSIS — I10 Essential (primary) hypertension: Secondary | ICD-10-CM | POA: Diagnosis not present

## 2019-11-01 DIAGNOSIS — R7303 Prediabetes: Secondary | ICD-10-CM | POA: Diagnosis not present

## 2019-11-01 DIAGNOSIS — Z136 Encounter for screening for cardiovascular disorders: Secondary | ICD-10-CM | POA: Diagnosis not present

## 2019-11-01 DIAGNOSIS — K219 Gastro-esophageal reflux disease without esophagitis: Secondary | ICD-10-CM | POA: Diagnosis not present

## 2019-11-08 DIAGNOSIS — R7303 Prediabetes: Secondary | ICD-10-CM | POA: Diagnosis not present

## 2019-11-08 DIAGNOSIS — Z87891 Personal history of nicotine dependence: Secondary | ICD-10-CM | POA: Diagnosis not present

## 2019-11-08 DIAGNOSIS — I10 Essential (primary) hypertension: Secondary | ICD-10-CM | POA: Diagnosis not present

## 2019-11-08 DIAGNOSIS — Z0000 Encounter for general adult medical examination without abnormal findings: Secondary | ICD-10-CM | POA: Diagnosis not present

## 2019-11-13 DIAGNOSIS — J449 Chronic obstructive pulmonary disease, unspecified: Secondary | ICD-10-CM | POA: Diagnosis not present

## 2019-11-24 DIAGNOSIS — R431 Parosmia: Secondary | ICD-10-CM | POA: Diagnosis not present

## 2019-12-05 DIAGNOSIS — H43822 Vitreomacular adhesion, left eye: Secondary | ICD-10-CM | POA: Diagnosis not present

## 2019-12-05 DIAGNOSIS — H34831 Tributary (branch) retinal vein occlusion, right eye, with macular edema: Secondary | ICD-10-CM | POA: Diagnosis not present

## 2019-12-13 DIAGNOSIS — J449 Chronic obstructive pulmonary disease, unspecified: Secondary | ICD-10-CM | POA: Diagnosis not present

## 2019-12-27 DIAGNOSIS — J849 Interstitial pulmonary disease, unspecified: Secondary | ICD-10-CM | POA: Diagnosis not present

## 2019-12-27 DIAGNOSIS — I2729 Other secondary pulmonary hypertension: Secondary | ICD-10-CM | POA: Diagnosis not present

## 2019-12-27 DIAGNOSIS — M349 Systemic sclerosis, unspecified: Secondary | ICD-10-CM | POA: Diagnosis not present

## 2019-12-27 DIAGNOSIS — M351 Other overlap syndromes: Secondary | ICD-10-CM | POA: Diagnosis not present

## 2019-12-27 DIAGNOSIS — I7300 Raynaud's syndrome without gangrene: Secondary | ICD-10-CM | POA: Diagnosis not present

## 2020-01-03 DIAGNOSIS — I8393 Asymptomatic varicose veins of bilateral lower extremities: Secondary | ICD-10-CM | POA: Diagnosis not present

## 2020-01-03 DIAGNOSIS — I2729 Other secondary pulmonary hypertension: Secondary | ICD-10-CM | POA: Diagnosis not present

## 2020-01-03 DIAGNOSIS — M351 Other overlap syndromes: Secondary | ICD-10-CM | POA: Diagnosis not present

## 2020-01-03 DIAGNOSIS — I7300 Raynaud's syndrome without gangrene: Secondary | ICD-10-CM | POA: Diagnosis not present

## 2020-01-03 DIAGNOSIS — J849 Interstitial pulmonary disease, unspecified: Secondary | ICD-10-CM | POA: Diagnosis not present

## 2020-01-03 DIAGNOSIS — M545 Low back pain: Secondary | ICD-10-CM | POA: Diagnosis not present

## 2020-01-03 DIAGNOSIS — M349 Systemic sclerosis, unspecified: Secondary | ICD-10-CM | POA: Diagnosis not present

## 2020-01-03 DIAGNOSIS — G8929 Other chronic pain: Secondary | ICD-10-CM | POA: Diagnosis not present

## 2020-01-13 DIAGNOSIS — J449 Chronic obstructive pulmonary disease, unspecified: Secondary | ICD-10-CM | POA: Diagnosis not present

## 2020-01-16 DIAGNOSIS — H35413 Lattice degeneration of retina, bilateral: Secondary | ICD-10-CM | POA: Diagnosis not present

## 2020-01-16 DIAGNOSIS — H34831 Tributary (branch) retinal vein occlusion, right eye, with macular edema: Secondary | ICD-10-CM | POA: Diagnosis not present

## 2020-01-16 DIAGNOSIS — H35033 Hypertensive retinopathy, bilateral: Secondary | ICD-10-CM | POA: Diagnosis not present

## 2020-01-16 DIAGNOSIS — H43822 Vitreomacular adhesion, left eye: Secondary | ICD-10-CM | POA: Diagnosis not present

## 2020-01-27 DIAGNOSIS — R569 Unspecified convulsions: Secondary | ICD-10-CM | POA: Diagnosis not present

## 2020-02-02 DIAGNOSIS — M351 Other overlap syndromes: Secondary | ICD-10-CM | POA: Diagnosis not present

## 2020-02-02 DIAGNOSIS — R439 Unspecified disturbances of smell and taste: Secondary | ICD-10-CM | POA: Diagnosis not present

## 2020-02-12 DIAGNOSIS — J449 Chronic obstructive pulmonary disease, unspecified: Secondary | ICD-10-CM | POA: Diagnosis not present

## 2020-02-13 DIAGNOSIS — H524 Presbyopia: Secondary | ICD-10-CM | POA: Diagnosis not present

## 2020-02-13 DIAGNOSIS — H5213 Myopia, bilateral: Secondary | ICD-10-CM | POA: Diagnosis not present

## 2020-02-13 DIAGNOSIS — H35033 Hypertensive retinopathy, bilateral: Secondary | ICD-10-CM | POA: Diagnosis not present

## 2020-02-13 DIAGNOSIS — Z0100 Encounter for examination of eyes and vision without abnormal findings: Secondary | ICD-10-CM | POA: Diagnosis not present

## 2020-02-13 DIAGNOSIS — H348312 Tributary (branch) retinal vein occlusion, right eye, stable: Secondary | ICD-10-CM | POA: Diagnosis not present

## 2020-02-13 DIAGNOSIS — H40013 Open angle with borderline findings, low risk, bilateral: Secondary | ICD-10-CM | POA: Diagnosis not present

## 2020-02-13 DIAGNOSIS — H52223 Regular astigmatism, bilateral: Secondary | ICD-10-CM | POA: Diagnosis not present

## 2020-02-21 ENCOUNTER — Other Ambulatory Visit: Payer: Self-pay | Admitting: Specialist

## 2020-02-21 DIAGNOSIS — J849 Interstitial pulmonary disease, unspecified: Secondary | ICD-10-CM | POA: Diagnosis not present

## 2020-02-21 DIAGNOSIS — R0602 Shortness of breath: Secondary | ICD-10-CM | POA: Diagnosis not present

## 2020-02-21 DIAGNOSIS — I272 Pulmonary hypertension, unspecified: Secondary | ICD-10-CM | POA: Diagnosis not present

## 2020-02-21 DIAGNOSIS — J8410 Pulmonary fibrosis, unspecified: Secondary | ICD-10-CM | POA: Diagnosis not present

## 2020-02-21 DIAGNOSIS — Z9981 Dependence on supplemental oxygen: Secondary | ICD-10-CM | POA: Diagnosis not present

## 2020-02-21 DIAGNOSIS — R0600 Dyspnea, unspecified: Secondary | ICD-10-CM | POA: Diagnosis not present

## 2020-02-24 DIAGNOSIS — H35033 Hypertensive retinopathy, bilateral: Secondary | ICD-10-CM | POA: Diagnosis not present

## 2020-02-24 DIAGNOSIS — H40013 Open angle with borderline findings, low risk, bilateral: Secondary | ICD-10-CM | POA: Diagnosis not present

## 2020-02-24 DIAGNOSIS — H524 Presbyopia: Secondary | ICD-10-CM | POA: Diagnosis not present

## 2020-02-24 DIAGNOSIS — H5213 Myopia, bilateral: Secondary | ICD-10-CM | POA: Diagnosis not present

## 2020-02-24 DIAGNOSIS — H52223 Regular astigmatism, bilateral: Secondary | ICD-10-CM | POA: Diagnosis not present

## 2020-02-24 DIAGNOSIS — H348312 Tributary (branch) retinal vein occlusion, right eye, stable: Secondary | ICD-10-CM | POA: Diagnosis not present

## 2020-02-27 DIAGNOSIS — H34831 Tributary (branch) retinal vein occlusion, right eye, with macular edema: Secondary | ICD-10-CM | POA: Diagnosis not present

## 2020-03-05 ENCOUNTER — Other Ambulatory Visit: Payer: Self-pay

## 2020-03-05 ENCOUNTER — Ambulatory Visit
Admission: RE | Admit: 2020-03-05 | Discharge: 2020-03-05 | Disposition: A | Discharge status: Home / Self Care | Payer: Medicare HMO | Source: Ambulatory Visit | Attending: Specialist | Admitting: Specialist

## 2020-03-05 DIAGNOSIS — J479 Bronchiectasis, uncomplicated: Secondary | ICD-10-CM | POA: Diagnosis not present

## 2020-03-05 DIAGNOSIS — I272 Pulmonary hypertension, unspecified: Secondary | ICD-10-CM | POA: Insufficient documentation

## 2020-03-05 DIAGNOSIS — J849 Interstitial pulmonary disease, unspecified: Secondary | ICD-10-CM | POA: Diagnosis not present

## 2020-03-05 DIAGNOSIS — I313 Pericardial effusion (noninflammatory): Secondary | ICD-10-CM | POA: Diagnosis not present

## 2020-03-05 DIAGNOSIS — J432 Centrilobular emphysema: Secondary | ICD-10-CM | POA: Diagnosis not present

## 2020-03-05 DIAGNOSIS — I700 Atherosclerosis of aorta: Secondary | ICD-10-CM | POA: Diagnosis not present

## 2020-03-14 DIAGNOSIS — J449 Chronic obstructive pulmonary disease, unspecified: Secondary | ICD-10-CM | POA: Diagnosis not present

## 2020-03-20 DIAGNOSIS — H524 Presbyopia: Secondary | ICD-10-CM | POA: Diagnosis not present

## 2020-03-20 DIAGNOSIS — H35033 Hypertensive retinopathy, bilateral: Secondary | ICD-10-CM | POA: Diagnosis not present

## 2020-03-20 DIAGNOSIS — H40013 Open angle with borderline findings, low risk, bilateral: Secondary | ICD-10-CM | POA: Diagnosis not present

## 2020-03-20 DIAGNOSIS — H348312 Tributary (branch) retinal vein occlusion, right eye, stable: Secondary | ICD-10-CM | POA: Diagnosis not present

## 2020-03-20 DIAGNOSIS — H5213 Myopia, bilateral: Secondary | ICD-10-CM | POA: Diagnosis not present

## 2020-03-20 DIAGNOSIS — H52223 Regular astigmatism, bilateral: Secondary | ICD-10-CM | POA: Diagnosis not present

## 2020-03-27 DIAGNOSIS — I2729 Other secondary pulmonary hypertension: Secondary | ICD-10-CM | POA: Diagnosis not present

## 2020-03-27 DIAGNOSIS — J849 Interstitial pulmonary disease, unspecified: Secondary | ICD-10-CM | POA: Diagnosis not present

## 2020-03-27 DIAGNOSIS — K219 Gastro-esophageal reflux disease without esophagitis: Secondary | ICD-10-CM | POA: Diagnosis not present

## 2020-03-27 DIAGNOSIS — I7300 Raynaud's syndrome without gangrene: Secondary | ICD-10-CM | POA: Diagnosis not present

## 2020-03-27 DIAGNOSIS — R0600 Dyspnea, unspecified: Secondary | ICD-10-CM | POA: Diagnosis not present

## 2020-03-27 DIAGNOSIS — M351 Other overlap syndromes: Secondary | ICD-10-CM | POA: Diagnosis not present

## 2020-03-27 DIAGNOSIS — I8393 Asymptomatic varicose veins of bilateral lower extremities: Secondary | ICD-10-CM | POA: Diagnosis not present

## 2020-03-27 DIAGNOSIS — J432 Centrilobular emphysema: Secondary | ICD-10-CM | POA: Diagnosis not present

## 2020-03-27 DIAGNOSIS — M349 Systemic sclerosis, unspecified: Secondary | ICD-10-CM | POA: Diagnosis not present

## 2020-03-27 DIAGNOSIS — M545 Low back pain: Secondary | ICD-10-CM | POA: Diagnosis not present

## 2020-03-27 DIAGNOSIS — G8929 Other chronic pain: Secondary | ICD-10-CM | POA: Diagnosis not present

## 2020-03-27 DIAGNOSIS — I272 Pulmonary hypertension, unspecified: Secondary | ICD-10-CM | POA: Diagnosis not present

## 2020-04-09 DIAGNOSIS — H34831 Tributary (branch) retinal vein occlusion, right eye, with macular edema: Secondary | ICD-10-CM | POA: Diagnosis not present

## 2020-04-14 DIAGNOSIS — J449 Chronic obstructive pulmonary disease, unspecified: Secondary | ICD-10-CM | POA: Diagnosis not present

## 2020-05-01 DIAGNOSIS — I10 Essential (primary) hypertension: Secondary | ICD-10-CM | POA: Diagnosis not present

## 2020-05-01 DIAGNOSIS — R7303 Prediabetes: Secondary | ICD-10-CM | POA: Diagnosis not present

## 2020-05-08 DIAGNOSIS — R7303 Prediabetes: Secondary | ICD-10-CM | POA: Diagnosis not present

## 2020-05-08 DIAGNOSIS — K219 Gastro-esophageal reflux disease without esophagitis: Secondary | ICD-10-CM | POA: Diagnosis not present

## 2020-05-08 DIAGNOSIS — Z136 Encounter for screening for cardiovascular disorders: Secondary | ICD-10-CM | POA: Diagnosis not present

## 2020-05-08 DIAGNOSIS — I10 Essential (primary) hypertension: Secondary | ICD-10-CM | POA: Diagnosis not present

## 2020-05-08 DIAGNOSIS — Z23 Encounter for immunization: Secondary | ICD-10-CM | POA: Diagnosis not present

## 2020-05-11 ENCOUNTER — Other Ambulatory Visit: Payer: Self-pay | Admitting: Obstetrics & Gynecology

## 2020-05-11 DIAGNOSIS — Z1231 Encounter for screening mammogram for malignant neoplasm of breast: Secondary | ICD-10-CM

## 2020-05-14 DIAGNOSIS — J449 Chronic obstructive pulmonary disease, unspecified: Secondary | ICD-10-CM | POA: Diagnosis not present

## 2020-05-21 DIAGNOSIS — H34831 Tributary (branch) retinal vein occlusion, right eye, with macular edema: Secondary | ICD-10-CM | POA: Diagnosis not present

## 2020-06-14 DIAGNOSIS — J449 Chronic obstructive pulmonary disease, unspecified: Secondary | ICD-10-CM | POA: Diagnosis not present

## 2020-06-18 DIAGNOSIS — H348312 Tributary (branch) retinal vein occlusion, right eye, stable: Secondary | ICD-10-CM | POA: Diagnosis not present

## 2020-06-18 DIAGNOSIS — H35033 Hypertensive retinopathy, bilateral: Secondary | ICD-10-CM | POA: Diagnosis not present

## 2020-06-18 DIAGNOSIS — H524 Presbyopia: Secondary | ICD-10-CM | POA: Diagnosis not present

## 2020-06-18 DIAGNOSIS — H35413 Lattice degeneration of retina, bilateral: Secondary | ICD-10-CM | POA: Diagnosis not present

## 2020-06-18 DIAGNOSIS — H5213 Myopia, bilateral: Secondary | ICD-10-CM | POA: Diagnosis not present

## 2020-06-18 DIAGNOSIS — H52223 Regular astigmatism, bilateral: Secondary | ICD-10-CM | POA: Diagnosis not present

## 2020-06-18 DIAGNOSIS — H401231 Low-tension glaucoma, bilateral, mild stage: Secondary | ICD-10-CM | POA: Diagnosis not present

## 2020-06-26 DIAGNOSIS — I8393 Asymptomatic varicose veins of bilateral lower extremities: Secondary | ICD-10-CM | POA: Diagnosis not present

## 2020-06-26 DIAGNOSIS — M545 Low back pain, unspecified: Secondary | ICD-10-CM | POA: Diagnosis not present

## 2020-06-26 DIAGNOSIS — J849 Interstitial pulmonary disease, unspecified: Secondary | ICD-10-CM | POA: Diagnosis not present

## 2020-06-26 DIAGNOSIS — M351 Other overlap syndromes: Secondary | ICD-10-CM | POA: Diagnosis not present

## 2020-06-26 DIAGNOSIS — I2729 Other secondary pulmonary hypertension: Secondary | ICD-10-CM | POA: Diagnosis not present

## 2020-06-26 DIAGNOSIS — M349 Systemic sclerosis, unspecified: Secondary | ICD-10-CM | POA: Diagnosis not present

## 2020-06-26 DIAGNOSIS — G8929 Other chronic pain: Secondary | ICD-10-CM | POA: Diagnosis not present

## 2020-06-26 DIAGNOSIS — I7300 Raynaud's syndrome without gangrene: Secondary | ICD-10-CM | POA: Diagnosis not present

## 2020-07-02 DIAGNOSIS — H43822 Vitreomacular adhesion, left eye: Secondary | ICD-10-CM | POA: Diagnosis not present

## 2020-07-02 DIAGNOSIS — H35033 Hypertensive retinopathy, bilateral: Secondary | ICD-10-CM | POA: Diagnosis not present

## 2020-07-02 DIAGNOSIS — H34831 Tributary (branch) retinal vein occlusion, right eye, with macular edema: Secondary | ICD-10-CM | POA: Diagnosis not present

## 2020-07-02 DIAGNOSIS — H35413 Lattice degeneration of retina, bilateral: Secondary | ICD-10-CM | POA: Diagnosis not present

## 2020-07-03 DIAGNOSIS — M545 Low back pain, unspecified: Secondary | ICD-10-CM | POA: Diagnosis not present

## 2020-07-03 DIAGNOSIS — I2729 Other secondary pulmonary hypertension: Secondary | ICD-10-CM | POA: Diagnosis not present

## 2020-07-03 DIAGNOSIS — G8929 Other chronic pain: Secondary | ICD-10-CM | POA: Diagnosis not present

## 2020-07-03 DIAGNOSIS — M351 Other overlap syndromes: Secondary | ICD-10-CM | POA: Diagnosis not present

## 2020-07-03 DIAGNOSIS — J849 Interstitial pulmonary disease, unspecified: Secondary | ICD-10-CM | POA: Diagnosis not present

## 2020-07-03 DIAGNOSIS — M349 Systemic sclerosis, unspecified: Secondary | ICD-10-CM | POA: Diagnosis not present

## 2020-07-03 DIAGNOSIS — I7300 Raynaud's syndrome without gangrene: Secondary | ICD-10-CM | POA: Diagnosis not present

## 2020-07-14 DIAGNOSIS — J449 Chronic obstructive pulmonary disease, unspecified: Secondary | ICD-10-CM | POA: Diagnosis not present

## 2020-07-17 DIAGNOSIS — Z20828 Contact with and (suspected) exposure to other viral communicable diseases: Secondary | ICD-10-CM | POA: Diagnosis not present

## 2020-07-17 DIAGNOSIS — U071 COVID-19: Secondary | ICD-10-CM | POA: Diagnosis not present

## 2020-07-25 ENCOUNTER — Ambulatory Visit
Admission: RE | Admit: 2020-07-25 | Discharge: 2020-07-25 | Disposition: A | Discharge status: Home / Self Care | Payer: Medicare HMO | Source: Ambulatory Visit | Attending: Obstetrics & Gynecology | Admitting: Obstetrics & Gynecology

## 2020-07-25 ENCOUNTER — Other Ambulatory Visit: Payer: Self-pay

## 2020-07-25 ENCOUNTER — Ambulatory Visit (INDEPENDENT_AMBULATORY_CARE_PROVIDER_SITE_OTHER): Payer: Medicare HMO | Admitting: Obstetrics & Gynecology

## 2020-07-25 ENCOUNTER — Encounter: Payer: Self-pay | Admitting: Obstetrics & Gynecology

## 2020-07-25 VITALS — BP 130/90 | Ht 65.0 in | Wt 152.0 lb

## 2020-07-25 DIAGNOSIS — Z1231 Encounter for screening mammogram for malignant neoplasm of breast: Secondary | ICD-10-CM | POA: Insufficient documentation

## 2020-07-25 DIAGNOSIS — Z01419 Encounter for gynecological examination (general) (routine) without abnormal findings: Secondary | ICD-10-CM

## 2020-07-25 NOTE — Patient Instructions (Signed)
PAP every three years Mammogram every year Colonoscopy every 10 years Labs yearly (with PCP)  Thank you for choosing [facility]. As part of our ongoing efforts to improve patient experience, we would appreciate your feedback. Please fill out the short survey that you will receive by mail or MyChart. Your opinion is important to [place]! - Dr. Kenton Kingfisher  Recommendations to boost your immunity to prevent illness such as viral flu and colds, including covid19, are as follows: Vitamin K2 and Vitamin D3. Take Vitamin K2 at 200-300 mcg daily (usually 2-3 pills daily of the over the counter formulation). Take Vitamin D3 at 3000-4000 U daily (usually 3-4 pills daily of the over the counter formulation). Studies show that these two at high normal levels in your system are very effective in keeping your immunity so strong and protective that you will be unlikely to contract viral illness such as those listed above.  Dr Kenton Kingfisher

## 2020-07-25 NOTE — Progress Notes (Signed)
HPI:      [name] is a 59 y.o. G1P1 who LMP was in the past, she presents today for her annual examination.  The patient has no complaints today. The patient is sexually active. Herlast pap: approximate date 2019 and was normal and last mammogram: approximate date 2020 and was normal.  The patient does perform self breast exams.  There is no notable family history of breast or ovarian cancer in her family. The patient is not taking hormone replacement therapy. Patient denies post-menopausal vaginal bleeding.   The patient has regular exercise: yes. The patient denies current symptoms of depression.    GYN Hx: Last Colonoscopy:6 yrs ago. Normal.  Last DEXA: never ago.    PMHx: Past Medical History:  Diagnosis Date  . COPD (chronic obstructive pulmonary disease) ([place])   . Diabetes mellitus without complication ([place])   . Discoid lupus   . Eczema   . Esophageal dysmotility   . Genital herpes   . GERD (gastroesophageal reflux disease)   . HSV (herpes simplex virus) infection   . Hypertension   . Lumbago   . Lumbar disc disease   . Peripheral neuropathy   . Pulmonary fibrosis ([employer])   . Pulmonary hypertension ([place])   . Raynaud's disease   . Sclerodactyly   . Scleroderma ([place])   . Spinal stenosis    Past Surgical History:  Procedure Laterality Date  . bone removal from pinkie toe    . DILATION AND CURETTAGE OF UTERUS    . RIGHT HEART CATH Right 09/17/2018   Procedure: RIGHT HEART CATH;  Surgeon: Teodoro Spray, MD;  Location: [facility];  Service: Cardiovascular;  Laterality: Right;  . SPLENECTOMY    . TUBAL LIGATION     Family History  Problem Relation Age of Onset  . Uterine cancer Mother   . Stroke Father   . Breast cancer Neg Hx    Social History   Tobacco Use  . Smoking status: Former Smoker    Packs/day: 0.50    Types: Cigarettes    Quit date: 01/08/2011    Years since quitting: 9.5  . Smokeless tobacco: Never Used  Vaping Use  . Vaping Use:  Never used  Substance Use Topics  . Alcohol use: No  . Drug use: No    Current Outpatient Medications:  .  amLODipine (NORVASC) 5 MG tablet, Take 5 mg by mouth daily., Disp: , Rfl:  .  aspirin 81 MG tablet, Take 81 mg by mouth daily., Disp: , Rfl:  .  Carboxymethylcellul-Glycerin (LUBRICATING EYE DROPS OP), Place 1 drop into both eyes daily as needed (dry eyes)., Disp: , Rfl:  .  fluticasone (FLONASE) 50 MCG/ACT nasal spray, Place 1 spray into both nostrils daily as needed for allergies or rhinitis., Disp: , Rfl:  .  gabapentin (NEURONTIN) 600 MG tablet, Take 600 mg by mouth 3 (three) times daily. , Disp: , Rfl:  .  loratadine (CLARITIN) 10 MG tablet, Take 10 mg by mouth daily as needed for allergies., Disp: , Rfl:  .  Multiple Vitamins-Minerals (MULTIVITAMIN WITH MINERALS) tablet, Take 1 tablet by mouth daily., Disp: , Rfl:  .  mycophenolate (CELLCEPT) 500 MG tablet, Take 500 mg by mouth 2 (two) times daily., Disp: , Rfl:  .  naproxen sodium (ALEVE) 220 MG tablet, Take 220 mg by mouth daily as needed (pain)., Disp: , Rfl:  .  omeprazole (PRILOSEC) 20 MG capsule, Take 20 mg by mouth 2 (two)  times daily. , Disp: , Rfl:  .  tadalafil, PAH, (ADCIRCA) 20 MG tablet, TAKE 1 TABLET (20 MG TOTAL) BY MOUTH ONCE DAILY, Disp: , Rfl:  Allergies: Hydroxychloroquine and Meclizine  Review of Systems  Constitutional: Negative for chills, fever and malaise/fatigue.  HENT: Negative for congestion, sinus pain and sore throat.   Eyes: Negative for blurred vision and pain.  Respiratory: Negative for cough and wheezing.   Cardiovascular: Negative for chest pain and leg swelling.  Gastrointestinal: Negative for abdominal pain, constipation, diarrhea, heartburn, nausea and vomiting.  Genitourinary: Negative for dysuria, frequency, hematuria and urgency.  Musculoskeletal: Negative for back pain, joint pain, myalgias and neck pain.  Skin: Negative for itching and rash.  Neurological: Negative for dizziness,  tremors and weakness.  Endo/Heme/Allergies: Does not bruise/bleed easily.  Psychiatric/Behavioral: Negative for depression. The patient is not nervous/anxious and does not have insomnia.     Objective: BP 130/90   Ht _0  (1.651 m)   Wt 152 lb (68.9 kg)   BMI 25.29 kg/m   Filed Weights   07/25/20 0848  Weight: 152 lb (68.9 kg)   Body mass index is 25.29 kg/m. Physical Exam Constitutional:      General: She is not in acute distress.    Appearance: She is well-developed and well-nourished.  Genitourinary:     Vagina, uterus and rectum normal.     There is no rash or lesion on the right labia.     There is no rash or lesion on the left labia.    No lesions in the vagina.     No vaginal bleeding.      Right Adnexa: not tender and no mass present.    Left Adnexa: not tender and no mass present.    No cervical motion tenderness, friability, lesion or polyp.     Uterus is mobile.     Uterus is not enlarged.     No uterine mass detected.    Uterus is midaxial.     Pelvic exam was performed with patient supine.  Breasts:     Right: No mass, skin change or tenderness.     Left: No mass, skin change or tenderness.    HENT:     Head: Normocephalic and atraumatic. No laceration.     Right Ear: Hearing normal.     Left Ear: Hearing normal.     Nose: No epistaxis or foreign body.     Mouth/Throat:     Mouth: Oropharynx is clear and moist and mucous membranes are normal.     Pharynx: Uvula midline.  Eyes:     Pupils: Pupils are equal, round, and reactive to light.  Neck:     Thyroid: No thyromegaly.  Cardiovascular:     Rate and Rhythm: Normal rate and regular rhythm.     Heart sounds: No murmur heard. No friction rub. No gallop.   Pulmonary:     Effort: Pulmonary effort is normal. No respiratory distress.     Breath sounds: Normal breath sounds. No wheezing.  Abdominal:     General: Bowel sounds are normal. There is no distension.     Palpations: Abdomen is soft.      Tenderness: There is no abdominal tenderness. There is no rebound.  Musculoskeletal:        General: Normal range of motion.     Cervical back: Normal range of motion and neck supple.  Neurological:     Mental Status: She is alert and oriented to  person, place, and time.     Cranial Nerves: No cranial nerve deficit.  Skin:    General: Skin is warm and dry.  Psychiatric:        Mood and Affect: Mood and affect normal.        Judgment: Judgment normal.  Vitals reviewed.     Assessment: Annual Exam 1. Women's annual routine gynecological examination     Plan:            1.  Cervical Screening-  Pap smear schedule reviewed with patient  2. Breast screening- Exam annually and mammogram scheduled  3. Colonoscopy every 10 years, Hemoccult testing after age 30  4. Labs managed by PCP  5. Counseling for hormonal therapy: none    F/U  Return in about 1 year (around 07/25/2021) for Annual.  Barnett Applebaum, MD, [facility], [facility] 07/25/2020  9:12 AM

## 2020-08-14 DIAGNOSIS — J449 Chronic obstructive pulmonary disease, unspecified: Secondary | ICD-10-CM | POA: Diagnosis not present

## 2020-08-22 DIAGNOSIS — H43822 Vitreomacular adhesion, left eye: Secondary | ICD-10-CM | POA: Diagnosis not present

## 2020-08-22 DIAGNOSIS — H3582 Retinal ischemia: Secondary | ICD-10-CM | POA: Diagnosis not present

## 2020-08-22 DIAGNOSIS — H34831 Tributary (branch) retinal vein occlusion, right eye, with macular edema: Secondary | ICD-10-CM | POA: Diagnosis not present

## 2020-09-14 DIAGNOSIS — J449 Chronic obstructive pulmonary disease, unspecified: Secondary | ICD-10-CM | POA: Diagnosis not present

## 2020-09-21 DIAGNOSIS — H35413 Lattice degeneration of retina, bilateral: Secondary | ICD-10-CM | POA: Diagnosis not present

## 2020-09-21 DIAGNOSIS — H348312 Tributary (branch) retinal vein occlusion, right eye, stable: Secondary | ICD-10-CM | POA: Diagnosis not present

## 2020-09-21 DIAGNOSIS — H5213 Myopia, bilateral: Secondary | ICD-10-CM | POA: Diagnosis not present

## 2020-09-21 DIAGNOSIS — H401231 Low-tension glaucoma, bilateral, mild stage: Secondary | ICD-10-CM | POA: Diagnosis not present

## 2020-09-21 DIAGNOSIS — H35033 Hypertensive retinopathy, bilateral: Secondary | ICD-10-CM | POA: Diagnosis not present

## 2020-09-21 DIAGNOSIS — H524 Presbyopia: Secondary | ICD-10-CM | POA: Diagnosis not present

## 2020-09-21 DIAGNOSIS — H52223 Regular astigmatism, bilateral: Secondary | ICD-10-CM | POA: Diagnosis not present

## 2021-08-02 ENCOUNTER — Encounter: Payer: Self-pay | Admitting: Obstetrics & Gynecology

## 2021-08-02 ENCOUNTER — Other Ambulatory Visit (HOSPITAL_COMMUNITY)
Admission: RE | Admit: 2021-08-02 | Discharge: 2021-08-02 | Disposition: A | Discharge status: Home / Self Care | Payer: Medicare Other | Source: Ambulatory Visit | Attending: Obstetrics & Gynecology | Admitting: Obstetrics & Gynecology

## 2021-08-02 ENCOUNTER — Ambulatory Visit (INDEPENDENT_AMBULATORY_CARE_PROVIDER_SITE_OTHER): Payer: Commercial Managed Care - HMO | Admitting: Obstetrics & Gynecology

## 2021-08-02 ENCOUNTER — Other Ambulatory Visit: Payer: Self-pay

## 2021-08-02 VITALS — BP 120/80 | Ht 65.0 in | Wt 138.0 lb

## 2021-08-02 DIAGNOSIS — Z1231 Encounter for screening mammogram for malignant neoplasm of breast: Secondary | ICD-10-CM

## 2021-08-02 DIAGNOSIS — Z01419 Encounter for gynecological examination (general) (routine) without abnormal findings: Secondary | ICD-10-CM | POA: Diagnosis not present

## 2021-08-02 DIAGNOSIS — Z1211 Encounter for screening for malignant neoplasm of colon: Secondary | ICD-10-CM

## 2021-08-02 DIAGNOSIS — Z124 Encounter for screening for malignant neoplasm of cervix: Secondary | ICD-10-CM | POA: Insufficient documentation

## 2021-08-02 NOTE — Patient Instructions (Signed)
PAP every three years Mammogram every year    Call [phone] to schedule at [facility] Colonoscopy every 10 years Labs yearly (with PCP) Back Specialist- Dr Earle Gell  Thank you for choosing [facility]. As part of our ongoing efforts to improve patient experience, we would appreciate your feedback. Please fill out the short survey that you will receive by mail or MyChart. Your opinion is important to [place]! - Dr. Kenton Kingfisher  Recommendations to boost your immunity to prevent illness such as viral flu and colds, including covid19, are as follows:       - - -  Vitamin K2 and Vitamin D3  - - - Take Vitamin K2 at 200-300 mcg daily (usually 2-3 pills daily of the over the counter formulation). Take Vitamin D3 at 3000-4000 U daily (usually 3-4 pills daily of the over the counter formulation). Studies show that these two at high normal levels in your system are very effective in keeping your immunity so strong and protective that you will be unlikely to contract viral illness such as those listed above.  Dr Kenton Kingfisher

## 2021-08-02 NOTE — Progress Notes (Signed)
HPI:      [name] is a 61 y.o. G1P1 who LMP was in the past, she presents today for her annual examination.  The patient has no complaints today. The patient is not currently sexually active.  She was widowed this past year (cancer).  Herlast pap: approximate date 2019 and was normal and last mammogram: approximate date 2021 and was normal.  The patient does perform self breast exams.  There is no notable family history of breast or ovarian cancer in her family. The patient is not taking hormone replacement therapy. Patient denies post-menopausal vaginal bleeding.   The patient has regular exercise: yes. The patient denies current symptoms of depression.    GYN Hx: Last Colonoscopy:10 years ago. Normal.  Last DEXA:  never  ago.    PMHx: Past Medical History:  Diagnosis Date   COPD (chronic obstructive pulmonary disease) ([place])    Diabetes mellitus without complication ([name])    Discoid lupus    Eczema    Esophageal dysmotility    Genital herpes    GERD (gastroesophageal reflux disease)    HSV (herpes simplex virus) infection    Hypertension    Lumbago    Lumbar disc disease    Peripheral neuropathy    Pulmonary fibrosis ([place])    Pulmonary hypertension ([place])    Raynaud's disease    Sclerodactyly    Scleroderma ([place])    Spinal stenosis    Past Surgical History:  Procedure Laterality Date   bone removal from pinkie toe     DILATION AND CURETTAGE OF UTERUS     RIGHT HEART CATH Right 09/17/2018   Procedure: RIGHT HEART CATH;  Surgeon: Teodoro Spray, MD;  Location: [facility];  Service: Cardiovascular;  Laterality: Right;   SPLENECTOMY     TUBAL LIGATION     Family History  Problem Relation Age of Onset   Uterine cancer Mother    Stroke Father    Breast cancer Neg Hx    Social History   Tobacco Use   Smoking status: Former    Packs/day: 0.50    Types: Cigarettes    Quit date: 01/08/2011    Years since quitting: 10.5   Smokeless tobacco: Never   Vaping Use   Vaping Use: Never used  Substance Use Topics   Alcohol use: No   Drug use: No    Current Outpatient Medications:    amLODipine (NORVASC) 5 MG tablet, Take 5 mg by mouth daily., Disp: , Rfl:    aspirin 81 MG tablet, Take 81 mg by mouth daily., Disp: , Rfl:    Carboxymethylcellul-Glycerin (LUBRICATING EYE DROPS OP), Place 1 drop into both eyes daily as needed (dry eyes)., Disp: , Rfl:    fluticasone (FLONASE) 50 MCG/ACT nasal spray, Place 1 spray into both nostrils daily as needed for allergies or rhinitis., Disp: , Rfl:    gabapentin (NEURONTIN) 600 MG tablet, Take 600 mg by mouth 3 (three) times daily. , Disp: , Rfl:    loratadine (CLARITIN) 10 MG tablet, Take 10 mg by mouth daily as needed for allergies., Disp: , Rfl:    Multiple Vitamins-Minerals (MULTIVITAMIN WITH MINERALS) tablet, Take 1 tablet by mouth daily., Disp: , Rfl:    mycophenolate (CELLCEPT) 500 MG tablet, Take 500 mg by mouth 2 (two) times daily., Disp: , Rfl:    naproxen sodium (ALEVE) 220 MG tablet, Take 220 mg by mouth daily as needed (pain)., Disp: , Rfl:    omeprazole (  PRILOSEC) 20 MG capsule, Take 20 mg by mouth 2 (two) times daily. , Disp: , Rfl:    tadalafil, PAH, (ADCIRCA) 20 MG tablet, TAKE 1 TABLET (20 MG TOTAL) BY MOUTH ONCE DAILY, Disp: , Rfl:  Allergies: Hydroxychloroquine and Meclizine  Review of Systems  Constitutional:  Negative for chills, fever and malaise/fatigue.  HENT:  Negative for congestion, sinus pain and sore throat.   Eyes:  Negative for blurred vision and pain.  Respiratory:  Negative for cough and wheezing.   Cardiovascular:  Negative for chest pain and leg swelling.  Gastrointestinal:  Negative for abdominal pain, constipation, diarrhea, heartburn, nausea and vomiting.  Genitourinary:  Negative for dysuria, frequency, hematuria and urgency.  Musculoskeletal:  Negative for back pain, joint pain, myalgias and neck pain.  Skin:  Negative for itching and rash.  Neurological:   Negative for dizziness, tremors and weakness.  Endo/Heme/Allergies:  Does not bruise/bleed easily.  Psychiatric/Behavioral:  Negative for depression. The patient is not nervous/anxious and does not have insomnia.    Objective: BP 120/80    Ht _0  (1.651 m)    Wt 138 lb (62.6 kg)    BMI 22.96 kg/m   Filed Weights   08/02/21 0822  Weight: 138 lb (62.6 kg)   Body mass index is 22.96 kg/m. Physical Exam Constitutional:      General: She is not in acute distress.    Appearance: She is well-developed.  Genitourinary:     Bladder, rectum and urethral meatus normal.     No lesions in the vagina.     Right Labia: No rash, tenderness or lesions.    Left Labia: No tenderness, lesions or rash.    No vaginal bleeding.      Right Adnexa: not tender and no mass present.    Left Adnexa: not tender and no mass present.    No cervical motion tenderness, friability, lesion or polyp.     Uterus is not enlarged.     No uterine mass detected.    Pelvic exam was performed with patient in the lithotomy position.  Breasts:    Right: No mass, skin change or tenderness.     Left: No mass, skin change or tenderness.  HENT:     Head: Normocephalic and atraumatic. No laceration.     Right Ear: Hearing normal.     Left Ear: Hearing normal.     Mouth/Throat:     Pharynx: Uvula midline.  Eyes:     Pupils: Pupils are equal, round, and reactive to light.  Neck:     Thyroid: No thyromegaly.  Cardiovascular:     Rate and Rhythm: Normal rate and regular rhythm.     Heart sounds: No murmur heard.   No friction rub. No gallop.  Pulmonary:     Effort: Pulmonary effort is normal. No respiratory distress.     Breath sounds: Normal breath sounds. No wheezing.  Abdominal:     General: Bowel sounds are normal. There is no distension.     Palpations: Abdomen is soft.     Tenderness: There is no abdominal tenderness. There is no rebound.  Musculoskeletal:        General: Normal range of motion.     Cervical  back: Normal range of motion and neck supple.  Neurological:     Mental Status: She is alert and oriented to person, place, and time.     Cranial Nerves: No cranial nerve deficit.  Skin:    General: Skin  is warm and dry.  Psychiatric:        Judgment: Judgment normal.  Vitals reviewed.    Assessment: Annual Exam 1. Women's annual routine gynecological examination   2. Screening for cervical cancer   3. Encounter for screening mammogram for malignant neoplasm of breast   4. Screen for colon cancer     Plan:            1.  Cervical Screening-  Pap smear done today  2. Breast screening- Exam annually and mammogram scheduled  3. Colonoscopy every 10 years, Hemoccult testing after age 48  4. Labs managed by PCP  5. Counseling for hormonal therapy: none              6. FRAX - FRAX score for assessing the 10 year probability for fracture calculated and discussed today.  Based on age and score today, DEXA is not currently scheduled.    F/U  Return in about 1 year (around 08/02/2022) for Annual.  Barnett Applebaum, MD, [facility], [facility] 08/02/2021  8:51 AM

## 2021-08-05 ENCOUNTER — Other Ambulatory Visit: Payer: Self-pay

## 2021-08-05 DIAGNOSIS — Z1211 Encounter for screening for malignant neoplasm of colon: Secondary | ICD-10-CM

## 2021-08-05 MED ORDER — NA SULFATE-K SULFATE-MG SULF 17.5-3.13-1.6 GM/177ML PO SOLN
1.0000 | Freq: Once | ORAL | 0 refills | Status: AC
Start: 2021-08-05 — End: 2021-08-05

## 2021-08-05 NOTE — Progress Notes (Signed)
Gastroenterology Pre-Procedure Review  Request Date: 08/30/2021 Requesting Physician: Dr. Vicente Males  PATIENT REVIEW QUESTIONS: The patient responded to the following health history questions as indicated:    1. Are you having any GI issues? no 2. Do you have a personal history of Polyps?  08/2011 unsure if polyps were removed. 3. Do you have a family history of Colon Cancer or Polyps? no 4. Diabetes Mellitus? no 5. Joint replacements in the past 12 months?no 6. Major health problems in the past 3 months?yes (Pulmonary Hypertension) 7. Any artificial heart valves, MVP, or defibrillator?no    MEDICATIONS & ALLERGIES:    Patient reports the following regarding taking any anticoagulation/antiplatelet therapy:   Plavix, Coumadin, Eliquis, Xarelto, Lovenox, Pradaxa, Brilinta, or Effient? no Aspirin? yes (81 mg)  Patient confirms/reports the following medications:  Current Outpatient Medications  Medication Sig Dispense Refill   amLODipine (NORVASC) 5 MG tablet Take 5 mg by mouth daily.     aspirin 81 MG tablet Take 81 mg by mouth daily.     Carboxymethylcellul-Glycerin (LUBRICATING EYE DROPS OP) Place 1 drop into both eyes daily as needed (dry eyes).     fluticasone (FLONASE) 50 MCG/ACT nasal spray Place 1 spray into both nostrils daily as needed for allergies or rhinitis.     gabapentin (NEURONTIN) 600 MG tablet Take 600 mg by mouth 3 (three) times daily.      loratadine (CLARITIN) 10 MG tablet Take 10 mg by mouth daily as needed for allergies.     Multiple Vitamins-Minerals (MULTIVITAMIN WITH MINERALS) tablet Take 1 tablet by mouth daily.     mycophenolate (CELLCEPT) 500 MG tablet Take 500 mg by mouth 2 (two) times daily.     naproxen sodium (ALEVE) 220 MG tablet Take 220 mg by mouth daily as needed (pain).     omeprazole (PRILOSEC) 20 MG capsule Take 20 mg by mouth 2 (two) times daily.      tadalafil, PAH, (ADCIRCA) 20 MG tablet TAKE 1 TABLET (20 MG TOTAL) BY MOUTH ONCE DAILY     No  current facility-administered medications for this visit.    Patient confirms/reports the following allergies:  Allergies  Allergen Reactions   Hydroxychloroquine     headaches   Meclizine     headaches    No orders of the defined types were placed in this encounter.   AUTHORIZATION INFORMATION Primary Insurance: 1D#: Group #:  Secondary Insurance: 1D#: Group #:  SCHEDULE INFORMATION: Date: 08/30/2021 Time: Location: [facility]

## 2021-08-06 LAB — CYTOLOGY - PAP
Comment: NEGATIVE
Diagnosis: NEGATIVE
High risk HPV: NEGATIVE

## 2021-08-07 ENCOUNTER — Other Ambulatory Visit: Payer: Self-pay

## 2021-08-07 ENCOUNTER — Ambulatory Visit
Admission: RE | Admit: 2021-08-07 | Discharge: 2021-08-07 | Disposition: A | Discharge status: Home / Self Care | Payer: Medicare Other | Source: Ambulatory Visit | Attending: Obstetrics & Gynecology | Admitting: Obstetrics & Gynecology

## 2021-08-07 DIAGNOSIS — Z1231 Encounter for screening mammogram for malignant neoplasm of breast: Secondary | ICD-10-CM | POA: Insufficient documentation

## 2021-08-14 ENCOUNTER — Telehealth: Payer: Self-pay

## 2021-08-14 NOTE — Telephone Encounter (Signed)
Called to inform patient that she has been cleared to have procedure per Dr. [name]. Pt verbalized understanding. Clearance will be scanned into chart per Ginger F.

## 2021-08-30 ENCOUNTER — Ambulatory Visit: Payer: Medicare Other | Admitting: Certified Registered Nurse Anesthetist

## 2021-08-30 ENCOUNTER — Encounter: Payer: Self-pay | Admitting: Gastroenterology

## 2021-08-30 ENCOUNTER — Encounter
Admission: RE | Disposition: A | Discharge status: Home / Self Care | Payer: Self-pay | Source: Home / Self Care | Attending: Gastroenterology

## 2021-08-30 ENCOUNTER — Ambulatory Visit
Admission: RE | Admit: 2021-08-30 | Discharge: 2021-08-30 | Disposition: A | Discharge status: Home / Self Care | Payer: Medicare Other | Attending: Gastroenterology | Admitting: Gastroenterology

## 2021-08-30 DIAGNOSIS — M4800 Spinal stenosis, site unspecified: Secondary | ICD-10-CM | POA: Diagnosis not present

## 2021-08-30 DIAGNOSIS — M349 Systemic sclerosis, unspecified: Secondary | ICD-10-CM | POA: Diagnosis not present

## 2021-08-30 DIAGNOSIS — I7300 Raynaud's syndrome without gangrene: Secondary | ICD-10-CM | POA: Insufficient documentation

## 2021-08-30 DIAGNOSIS — I10 Essential (primary) hypertension: Secondary | ICD-10-CM | POA: Insufficient documentation

## 2021-08-30 DIAGNOSIS — I272 Pulmonary hypertension, unspecified: Secondary | ICD-10-CM | POA: Insufficient documentation

## 2021-08-30 DIAGNOSIS — G629 Polyneuropathy, unspecified: Secondary | ICD-10-CM | POA: Insufficient documentation

## 2021-08-30 DIAGNOSIS — J8410 Pulmonary fibrosis, unspecified: Secondary | ICD-10-CM | POA: Insufficient documentation

## 2021-08-30 DIAGNOSIS — Z87891 Personal history of nicotine dependence: Secondary | ICD-10-CM | POA: Insufficient documentation

## 2021-08-30 DIAGNOSIS — J449 Chronic obstructive pulmonary disease, unspecified: Secondary | ICD-10-CM | POA: Insufficient documentation

## 2021-08-30 DIAGNOSIS — Z1211 Encounter for screening for malignant neoplasm of colon: Secondary | ICD-10-CM | POA: Diagnosis not present

## 2021-08-30 DIAGNOSIS — K219 Gastro-esophageal reflux disease without esophagitis: Secondary | ICD-10-CM | POA: Insufficient documentation

## 2021-08-30 HISTORY — PX: COLONOSCOPY WITH PROPOFOL: SHX5780

## 2021-08-30 SURGERY — COLONOSCOPY WITH PROPOFOL
Anesthesia: General

## 2021-08-30 MED ORDER — PROPOFOL 500 MG/50ML IV EMUL
INTRAVENOUS | Status: AC
  Filled 2021-08-30: qty 250

## 2021-08-30 MED ORDER — PROPOFOL 10 MG/ML IV BOLUS
INTRAVENOUS | Status: DC | PRN
  Administered 2021-08-30: 50 mg via INTRAVENOUS

## 2021-08-30 MED ORDER — SODIUM CHLORIDE 0.9 % IV SOLN: INTRAVENOUS | Status: DC

## 2021-08-30 MED ORDER — PHENYLEPHRINE HCL-NACL 20-0.9 MG/250ML-% IV SOLN
INTRAVENOUS | Status: AC
  Filled 2021-08-30: qty 250

## 2021-08-30 MED ORDER — PHENYLEPHRINE HCL (PRESSORS) 10 MG/ML IV SOLN
INTRAVENOUS | Status: AC
  Filled 2021-08-30: qty 1

## 2021-08-30 MED ORDER — PROPOFOL 500 MG/50ML IV EMUL
INTRAVENOUS | Status: DC | PRN
  Administered 2021-08-30: 140 ug/kg/min via INTRAVENOUS

## 2021-08-30 MED ORDER — GLYCOPYRROLATE 0.2 MG/ML IJ SOLN
INTRAMUSCULAR | Status: AC
  Filled 2021-08-30: qty 1

## 2021-08-30 NOTE — Transfer of Care (Signed)
Immediate Anesthesia Transfer of Care Note  Patient: Tammy Barrett  Procedure(s) Performed: COLONOSCOPY WITH PROPOFOL  Patient Location: PACU  Anesthesia Type:General  Level of Consciousness: sedated  Airway & Oxygen Therapy: Patient Spontanous Breathing and Patient connected to nasal cannula oxygen  Post-op Assessment: Report given to RN and Post -op Vital signs reviewed and stable  Post vital signs: Reviewed and stable  Last Vitals:  Vitals Value Taken Time  BP 123/86 08/30/21 0830  Temp    Pulse 88 08/30/21 0830  Resp 20 08/30/21 0830  SpO2 89 % 08/30/21 0830  Vitals shown include unvalidated device data.  Last Pain:  Vitals:   08/30/21 0719  TempSrc: Temporal  PainSc: 0-No pain         Complications: No notable events documented.

## 2021-08-30 NOTE — Op Note (Signed)
[facility] Patient Name: Tammy Barrett Procedure Date: 08/30/2021 7:37 AM MRN: [id number] Account #: [id number] Date of Birth: Jan 25, 1961 Admit Type: Outpatient Age: 61 Room: [facility] ENDO ROOM 4 Gender: Female Note Status: Finalized Instrument Name: Jasper Riling [id number] Procedure:             Colonoscopy Indications:           Screening for colorectal malignant neoplasm Providers:             [name] MD, MD Referring MD:          Dion Body (Referring MD) Medicines:             Monitored Anesthesia Care Complications:         No immediate complications. Procedure:             Pre-Anesthesia Assessment:                        - Prior to the procedure, a History and Physical was                         performed, and patient medications, allergies and                         sensitivities were reviewed. The patient's tolerance                         of previous anesthesia was reviewed.                        - The risks and benefits of the procedure and the                         sedation options and risks were discussed with the                         patient. All questions were answered and informed                         consent was obtained.                        - ASA Grade Assessment: II - A patient with mild                         systemic disease.                        After obtaining informed consent, the colonoscope was                         passed under direct vision. Throughout the procedure,                         the patient's blood pressure, pulse, and oxygen                         saturations were monitored continuously. The                         Colonoscope was introduced through  the anus and                         advanced to the the cecum, identified by the                         appendiceal orifice. The colonoscopy was performed                         with ease. The patient tolerated the procedure well.                          The quality of the bowel preparation was excellent. Findings:      The perianal and digital rectal examinations were normal.      The entire examined colon appeared normal. Impression:            - The entire examined colon is normal.                        - No specimens collected. Recommendation:        - Discharge patient to home (with escort).                        - Resume previous diet.                        - Continue present medications.                        - Repeat colonoscopy in 10 years for screening                         purposes. Procedure Code(s):     --- Professional ---                        [phone], Colonoscopy, flexible; diagnostic, including                         collection of specimen(s) by brushing or washing, when                         performed (separate procedure) Diagnosis Code(s):     --- Professional ---                        Z12.11, Encounter for screening for malignant neoplasm                         of colon CPT copyright 2019 [employer]. All rights reserved. The codes documented in this report are preliminary and upon coder review may  be revised to meet current compliance requirements. [name], MD [name] MD, MD 08/30/2021 8:27:46 AM This report has been signed electronically. Number of Addenda: 0 Note Initiated On: 08/30/2021 7:37 AM Scope Withdrawal Time: 0 hours 13 minutes 38 seconds  Total Procedure Duration: 0 hours 21 minutes 9 seconds  Estimated Blood Loss:  Estimated blood loss: none.      [facility]

## 2021-08-30 NOTE — H&P (Signed)
Tammy Bellows, MD [address], [place], [place], [place], [address] [address], [place], [place], [place], [address] Phone: [phone]  Fax: [phone]  Primary Care Physician:  Dion Body, MD   Pre-Procedure History & Physical: HPI:  Tammy Barrett is a 61 y.o. female is here for an colonoscopy.   Past Medical History:  Diagnosis Date   COPD (chronic obstructive pulmonary disease) ([name])    Discoid lupus    Eczema    Esophageal dysmotility    Genital herpes    GERD (gastroesophageal reflux disease)    HSV (herpes simplex virus) infection    Hypertension    Lumbago    Lumbar disc disease    Peripheral neuropathy    Pulmonary fibrosis ([name])    Pulmonary hypertension ([name])    Raynaud's disease    Sclerodactyly    Scleroderma ([place])    Spinal stenosis     Past Surgical History:  Procedure Laterality Date   bone removal from pinkie toe     DILATION AND CURETTAGE OF UTERUS     RIGHT HEART CATH Right 09/17/2018   Procedure: RIGHT HEART CATH;  Surgeon: Teodoro Spray, MD;  Location: [facility];  Service: Cardiovascular;  Laterality: Right;   SPLENECTOMY     TUBAL LIGATION      Prior to Admission medications   Medication Sig Start Date End Date Taking? Authorizing Provider  amLODipine (NORVASC) 5 MG tablet Take 5 mg by mouth daily.   Yes [provider]  aspirin 81 MG tablet Take 81 mg by mouth daily.   Yes [provider]  gabapentin (NEURONTIN) 600 MG tablet Take 600 mg by mouth 3 (three) times daily.    Yes [provider]  Multiple Vitamins-Minerals (MULTIVITAMIN WITH MINERALS) tablet Take 1 tablet by mouth daily.   Yes [provider]  omeprazole (PRILOSEC) 20 MG capsule Take 20 mg by mouth 2 (two) times daily.    Yes [provider]  Carboxymethylcellul-Glycerin (LUBRICATING EYE DROPS OP) Place 1 drop into both eyes daily as needed (dry eyes).    [provider]  fluticasone  (FLONASE) 50 MCG/ACT nasal spray Place 1 spray into both nostrils daily as needed for allergies or rhinitis.    [provider]  loratadine (CLARITIN) 10 MG tablet Take 10 mg by mouth daily as needed for allergies.    [provider]  mycophenolate (CELLCEPT) 500 MG tablet Take 500 mg by mouth 2 (two) times daily.    [provider]  naproxen sodium (ALEVE) 220 MG tablet Take 220 mg by mouth daily as needed (pain).    [provider]  tadalafil, PAH, (ADCIRCA) 20 MG tablet TAKE 1 TABLET (20 MG TOTAL) BY MOUTH ONCE DAILY 06/20/19   [provider]    Allergies as of 08/05/2021 - Review Complete 08/02/2021  Allergen Reaction Noted   Hydroxychloroquine  01/08/2016   Meclizine  09/10/2018    Family History  Problem Relation Age of Onset   Uterine cancer Mother    Stroke Father    Breast cancer Neg Hx     Social History   Socioeconomic History   Marital status: Married    Spouse name: Not on file   Number of children: Not on file   Years of education: Not on file   Highest education level: Not on file  Occupational History   Not on file  Tobacco Use   Smoking status: Former  Packs/day: 0.50    Types: Cigarettes    Quit date: 01/08/2011    Years since quitting: 10.6   Smokeless tobacco: Never  Vaping Use   Vaping Use: Never used  Substance and Sexual Activity   Alcohol use: No   Drug use: No   Sexual activity: Yes  Other Topics Concern   Not on file  Social History Narrative   Not on file   Social Determinants of Health   Financial Resource Strain: Not on file  Food Insecurity: Not on file  Transportation Needs: Not on file  Physical Activity: Not on file  Stress: Not on file  Social Connections: Not on file  Intimate Partner Violence: Not on file    Review of Systems: See HPI, otherwise negative ROS  Physical Exam: BP (!) 149/98    Pulse 92    Temp (!) 97.2 F (36.2 C) (Temporal)    Resp 17    Ht _0  (1.651 m)     Wt 63.5 kg    SpO2 99%    BMI 23.30 kg/m  General:   Alert,  pleasant and cooperative in NAD Head:  Normocephalic and atraumatic. Neck:  Supple; no masses or thyromegaly. Lungs:  Clear throughout to auscultation, normal respiratory effort.    Heart:  +S1, +S2, Regular rate and rhythm, No edema. Abdomen:  Soft, nontender and nondistended. Normal bowel sounds, without guarding, and without rebound.   Neurologic:  Alert and  oriented x4;  grossly normal neurologically.  Impression/Plan: Tammy Barrett is here for an colonoscopy to be performed for Screening colonoscopy average risk   Risks, benefits, limitations, and alternatives regarding  colonoscopy have been reviewed with the patient.  Questions have been answered.  All parties agreeable.   Tammy Bellows, MD  08/30/2021, 7:53 AM

## 2021-08-30 NOTE — Anesthesia Postprocedure Evaluation (Signed)
Anesthesia Post Note  Patient: Tammy Barrett  Procedure(s) Performed: COLONOSCOPY WITH PROPOFOL  Patient location during evaluation: PACU Anesthesia Type: General Level of consciousness: awake Pain management: satisfactory to patient Vital Signs Assessment: post-procedure vital signs reviewed and stable Respiratory status: respiratory function stable Cardiovascular status: stable Anesthetic complications: no   No notable events documented.   Last Vitals:  Vitals:   08/30/21 0830 08/30/21 0840  BP: 123/86 (!) 132/95  Pulse: 88 87  Resp: 20   Temp:    SpO2: (!) 89% 96%    Last Pain:  Vitals:   08/30/21 0719  TempSrc: Temporal  PainSc: 0-No pain                 VAN STAVEREN,Reubin Bushnell

## 2021-08-30 NOTE — Anesthesia Preprocedure Evaluation (Signed)
Anesthesia Evaluation  Patient identified by MRN, date of birth, ID band Patient awake    Reviewed: Allergy & Precautions, NPO status , Patient's Chart, lab work & pertinent test results  Airway Mallampati: III  TM Distance: >3 FB Neck ROM: full  Mouth opening: Limited Mouth Opening  Dental  (+) Teeth Intact, Lower Dentures   Pulmonary neg pulmonary ROS, COPD, former smoker,    Pulmonary exam normal  + decreased breath sounds      Cardiovascular Exercise Tolerance: Good hypertension, Pt. on medications negative cardio ROS Normal cardiovascular exam Rhythm:Regular     Neuro/Psych negative neurological ROS  negative psych ROS   GI/Hepatic negative GI ROS, Neg liver ROS, GERD  ,  Endo/Other  negative endocrine ROSneg diabetes  Renal/GU negative Renal ROS  negative genitourinary   Musculoskeletal negative musculoskeletal ROS (+)   Abdominal Normal abdominal exam  (+)   Peds negative pediatric ROS (+)  Hematology negative hematology ROS (+)   Anesthesia Other Findings Past Medical History: No date: COPD (chronic obstructive pulmonary disease) ([name]) No date: Discoid lupus No date: Eczema No date: Esophageal dysmotility No date: Genital herpes No date: GERD (gastroesophageal reflux disease) No date: HSV (herpes simplex virus) infection No date: Hypertension No date: Lumbago No date: Lumbar disc disease No date: Peripheral neuropathy No date: Pulmonary fibrosis ([name]) No date: Pulmonary hypertension ([name]) No date: Raynaud's disease No date: Sclerodactyly No date: Scleroderma ([place]) No date: Spinal stenosis  Past Surgical History: No date: bone removal from pinkie toe No date: DILATION AND CURETTAGE OF UTERUS 09/17/2018: RIGHT HEART CATH; Right     Comment:  Procedure: RIGHT HEART CATH;  Surgeon: Teodoro Spray,               MD;  Location: [facility];  Service:               Cardiovascular;   Laterality: Right; No date: SPLENECTOMY No date: TUBAL LIGATION  BMI    Body Mass Index: 23.30 kg/m      Reproductive/Obstetrics negative OB ROS                             Anesthesia Physical Anesthesia Plan  ASA: 3  Anesthesia Plan: General   Post-op Pain Management:    Induction: Intravenous  PONV Risk Score and Plan: Propofol infusion and TIVA  Airway Management Planned: Natural Airway and Nasal Cannula  Additional Equipment:   Intra-op Plan:   Post-operative Plan:   Informed Consent: I have reviewed the patients History and Physical, chart, labs and discussed the procedure including the risks, benefits and alternatives for the proposed anesthesia with the patient or authorized representative who has indicated his/her understanding and acceptance.     Dental Advisory Given  Plan Discussed with: CRNA and Surgeon  Anesthesia Plan Comments:         Anesthesia Quick Evaluation

## 2021-09-02 ENCOUNTER — Encounter: Payer: Self-pay | Admitting: Gastroenterology

## 2021-12-05 ENCOUNTER — Other Ambulatory Visit: Payer: Self-pay | Admitting: Physical Medicine and Rehabilitation

## 2021-12-05 DIAGNOSIS — M5416 Radiculopathy, lumbar region: Secondary | ICD-10-CM

## 2021-12-17 ENCOUNTER — Ambulatory Visit
Admission: RE | Admit: 2021-12-17 | Discharge: 2021-12-17 | Disposition: A | Discharge status: Home / Self Care | Payer: Medicare Other | Source: Ambulatory Visit | Attending: Physical Medicine and Rehabilitation | Admitting: Physical Medicine and Rehabilitation

## 2021-12-17 DIAGNOSIS — M5416 Radiculopathy, lumbar region: Secondary | ICD-10-CM | POA: Diagnosis present

## 2022-02-06 ENCOUNTER — Other Ambulatory Visit
Admission: RE | Admit: 2022-02-06 | Discharge: 2022-02-06 | Disposition: A | Discharge status: Home / Self Care | Payer: Medicare Other | Source: Ambulatory Visit | Attending: Family Medicine | Admitting: Family Medicine

## 2022-02-06 DIAGNOSIS — R600 Localized edema: Secondary | ICD-10-CM | POA: Diagnosis present

## 2022-02-06 DIAGNOSIS — I272 Pulmonary hypertension, unspecified: Secondary | ICD-10-CM | POA: Diagnosis present

## 2022-02-06 LAB — BRAIN NATRIURETIC PEPTIDE: B Natriuretic Peptide: 1292.1 pg/mL — ABNORMAL HIGH (ref 0.0–100.0)

## 2022-03-06 ENCOUNTER — Other Ambulatory Visit: Payer: Self-pay

## 2022-03-06 ENCOUNTER — Inpatient Hospital Stay
Admission: EM | Admit: 2022-03-06 | Discharge: 2022-03-24 | DRG: 163 | Disposition: A | Discharge status: Skilled Nursing Facility | Payer: Medicare Other | Attending: Internal Medicine | Admitting: Internal Medicine

## 2022-03-06 ENCOUNTER — Emergency Department: Payer: Medicare Other

## 2022-03-06 DIAGNOSIS — Z6825 Body mass index (BMI) 25.0-25.9, adult: Secondary | ICD-10-CM

## 2022-03-06 DIAGNOSIS — Z9981 Dependence on supplemental oxygen: Secondary | ICD-10-CM

## 2022-03-06 DIAGNOSIS — J9621 Acute and chronic respiratory failure with hypoxia: Secondary | ICD-10-CM | POA: Diagnosis present

## 2022-03-06 DIAGNOSIS — Z87891 Personal history of nicotine dependence: Secondary | ICD-10-CM

## 2022-03-06 DIAGNOSIS — R590 Localized enlarged lymph nodes: Secondary | ICD-10-CM | POA: Diagnosis present

## 2022-03-06 DIAGNOSIS — M349 Systemic sclerosis, unspecified: Secondary | ICD-10-CM | POA: Diagnosis present

## 2022-03-06 DIAGNOSIS — Z7982 Long term (current) use of aspirin: Secondary | ICD-10-CM

## 2022-03-06 DIAGNOSIS — R918 Other nonspecific abnormal finding of lung field: Secondary | ICD-10-CM | POA: Diagnosis present

## 2022-03-06 DIAGNOSIS — D84821 Immunodeficiency due to drugs: Secondary | ICD-10-CM | POA: Diagnosis present

## 2022-03-06 DIAGNOSIS — Z515 Encounter for palliative care: Secondary | ICD-10-CM | POA: Diagnosis not present

## 2022-03-06 DIAGNOSIS — L930 Discoid lupus erythematosus: Secondary | ICD-10-CM | POA: Diagnosis present

## 2022-03-06 DIAGNOSIS — Z79899 Other long term (current) drug therapy: Secondary | ICD-10-CM

## 2022-03-06 DIAGNOSIS — Z9081 Acquired absence of spleen: Secondary | ICD-10-CM

## 2022-03-06 DIAGNOSIS — K224 Dyskinesia of esophagus: Secondary | ICD-10-CM | POA: Diagnosis present

## 2022-03-06 DIAGNOSIS — I2602 Saddle embolus of pulmonary artery with acute cor pulmonale: Secondary | ICD-10-CM | POA: Diagnosis present

## 2022-03-06 DIAGNOSIS — K219 Gastro-esophageal reflux disease without esophagitis: Secondary | ICD-10-CM | POA: Diagnosis present

## 2022-03-06 DIAGNOSIS — Z888 Allergy status to other drugs, medicaments and biological substances status: Secondary | ICD-10-CM

## 2022-03-06 DIAGNOSIS — M351 Other overlap syndromes: Secondary | ICD-10-CM | POA: Diagnosis present

## 2022-03-06 DIAGNOSIS — J439 Emphysema, unspecified: Secondary | ICD-10-CM | POA: Diagnosis present

## 2022-03-06 DIAGNOSIS — E43 Unspecified severe protein-calorie malnutrition: Secondary | ICD-10-CM | POA: Diagnosis present

## 2022-03-06 DIAGNOSIS — L89892 Pressure ulcer of other site, stage 2: Secondary | ICD-10-CM | POA: Diagnosis not present

## 2022-03-06 DIAGNOSIS — I2699 Other pulmonary embolism without acute cor pulmonale: Secondary | ICD-10-CM | POA: Diagnosis not present

## 2022-03-06 DIAGNOSIS — J9601 Acute respiratory failure with hypoxia: Secondary | ICD-10-CM | POA: Diagnosis present

## 2022-03-06 DIAGNOSIS — N289 Disorder of kidney and ureter, unspecified: Secondary | ICD-10-CM | POA: Diagnosis present

## 2022-03-06 DIAGNOSIS — J8410 Pulmonary fibrosis, unspecified: Secondary | ICD-10-CM | POA: Diagnosis present

## 2022-03-06 DIAGNOSIS — Z79624 Long term (current) use of inhibitors of nucleotide synthesis: Secondary | ICD-10-CM

## 2022-03-06 DIAGNOSIS — G629 Polyneuropathy, unspecified: Secondary | ICD-10-CM | POA: Diagnosis present

## 2022-03-06 DIAGNOSIS — I272 Pulmonary hypertension, unspecified: Secondary | ICD-10-CM | POA: Diagnosis present

## 2022-03-06 DIAGNOSIS — I5032 Chronic diastolic (congestive) heart failure: Secondary | ICD-10-CM | POA: Diagnosis present

## 2022-03-06 DIAGNOSIS — I2729 Other secondary pulmonary hypertension: Secondary | ICD-10-CM | POA: Diagnosis present

## 2022-03-06 DIAGNOSIS — I5030 Unspecified diastolic (congestive) heart failure: Secondary | ICD-10-CM | POA: Diagnosis present

## 2022-03-06 DIAGNOSIS — L8990 Pressure ulcer of unspecified site, unspecified stage: Secondary | ICD-10-CM | POA: Insufficient documentation

## 2022-03-06 DIAGNOSIS — I517 Cardiomegaly: Secondary | ICD-10-CM | POA: Diagnosis not present

## 2022-03-06 DIAGNOSIS — K760 Fatty (change of) liver, not elsewhere classified: Secondary | ICD-10-CM | POA: Diagnosis present

## 2022-03-06 DIAGNOSIS — I10 Essential (primary) hypertension: Secondary | ICD-10-CM | POA: Diagnosis not present

## 2022-03-06 DIAGNOSIS — I110 Hypertensive heart disease with heart failure: Secondary | ICD-10-CM | POA: Diagnosis present

## 2022-03-06 DIAGNOSIS — Z7901 Long term (current) use of anticoagulants: Secondary | ICD-10-CM

## 2022-03-06 DIAGNOSIS — I7300 Raynaud's syndrome without gangrene: Secondary | ICD-10-CM | POA: Diagnosis present

## 2022-03-06 DIAGNOSIS — Z789 Other specified health status: Secondary | ICD-10-CM | POA: Diagnosis not present

## 2022-03-06 LAB — CBC WITH DIFFERENTIAL/PLATELET
Abs Immature Granulocytes: 0.03 10*3/uL (ref 0.00–0.07)
Basophils Absolute: 0 10*3/uL (ref 0.0–0.1)
Basophils Relative: 1 %
Eosinophils Absolute: 0 10*3/uL (ref 0.0–0.5)
Eosinophils Relative: 1 %
HCT: 55.6 % — ABNORMAL HIGH (ref 36.0–46.0)
Hemoglobin: 18.3 g/dL — ABNORMAL HIGH (ref 12.0–15.0)
Immature Granulocytes: 1 %
Lymphocytes Relative: 20 %
Lymphs Abs: 1.1 10*3/uL (ref 0.7–4.0)
MCH: 31.4 pg (ref 26.0–34.0)
MCHC: 32.9 g/dL (ref 30.0–36.0)
MCV: 95.5 fL (ref 80.0–100.0)
Monocytes Absolute: 0.4 10*3/uL (ref 0.1–1.0)
Monocytes Relative: 7 %
Neutro Abs: 3.9 10*3/uL (ref 1.7–7.7)
Neutrophils Relative %: 70 %
Platelets: 168 10*3/uL (ref 150–400)
RBC: 5.82 MIL/uL — ABNORMAL HIGH (ref 3.87–5.11)
RDW: 18.6 % — ABNORMAL HIGH (ref 11.5–15.5)
WBC: 5.4 10*3/uL (ref 4.0–10.5)
nRBC: 1.3 % — ABNORMAL HIGH (ref 0.0–0.2)

## 2022-03-06 LAB — COMPREHENSIVE METABOLIC PANEL
ALT: 14 U/L (ref 0–44)
AST: 40 U/L (ref 15–41)
Albumin: 3.2 g/dL — ABNORMAL LOW (ref 3.5–5.0)
Alkaline Phosphatase: 424 U/L — ABNORMAL HIGH (ref 38–126)
Anion gap: 10 (ref 5–15)
BUN: 24 mg/dL — ABNORMAL HIGH (ref 6–20)
CO2: 22 mmol/L (ref 22–32)
Calcium: 8.8 mg/dL — ABNORMAL LOW (ref 8.9–10.3)
Chloride: 104 mmol/L (ref 98–111)
Creatinine, Ser: 1.24 mg/dL — ABNORMAL HIGH (ref 0.44–1.00)
GFR, Estimated: 50 mL/min — ABNORMAL LOW (ref >60)
Glucose, Bld: 111 mg/dL — ABNORMAL HIGH (ref 70–99)
Potassium: 5 mmol/L (ref 3.5–5.1)
Sodium: 136 mmol/L (ref 135–145)
Total Bilirubin: 2.7 mg/dL — ABNORMAL HIGH (ref 0.3–1.2)
Total Protein: 7.9 g/dL (ref 6.5–8.1)

## 2022-03-06 LAB — BILIRUBIN, DIRECT: Bilirubin, Direct: 0.9 mg/dL — ABNORMAL HIGH (ref 0.0–0.2)

## 2022-03-06 LAB — TROPONIN I (HIGH SENSITIVITY): Troponin I (High Sensitivity): 40 ng/L — ABNORMAL HIGH (ref <18)

## 2022-03-06 LAB — PROTIME-INR
INR: 1.7 — ABNORMAL HIGH (ref 0.8–1.2)
Prothrombin Time: 19.6 seconds — ABNORMAL HIGH (ref 11.4–15.2)

## 2022-03-06 LAB — MRSA NEXT GEN BY PCR, NASAL: MRSA by PCR Next Gen: NOT DETECTED

## 2022-03-06 LAB — APTT: aPTT: >200 seconds (ref 24–36)

## 2022-03-06 LAB — BRAIN NATRIURETIC PEPTIDE: B Natriuretic Peptide: 1738.4 pg/mL — ABNORMAL HIGH (ref 0.0–100.0)

## 2022-03-06 LAB — LIPASE, BLOOD: Lipase: 26 U/L (ref 11–51)

## 2022-03-06 MED ORDER — AMLODIPINE BESYLATE 10 MG PO TABS
10.0000 mg | ORAL_TABLET | Freq: Every day | ORAL | Status: DC
  Administered 2022-03-07 – 2022-03-24 (×17): 10 mg via ORAL
  Filled 2022-03-06 (×17): qty 1

## 2022-03-06 MED ORDER — FLUTICASONE PROPIONATE 50 MCG/ACT NA SUSP
1.0000 | Freq: Every day | NASAL | Status: DC | PRN
  Administered 2022-03-12: 1 via NASAL
  Filled 2022-03-06: qty 16

## 2022-03-06 MED ORDER — HEPARIN BOLUS VIA INFUSION
4000.0000 [IU] | Freq: Once | INTRAVENOUS | Status: AC
  Administered 2022-03-06: 4000 [IU] via INTRAVENOUS
  Filled 2022-03-06 (×2): qty 4000

## 2022-03-06 MED ORDER — CHLORHEXIDINE GLUCONATE CLOTH 2 % EX PADS
6.0000 | MEDICATED_PAD | Freq: Every day | CUTANEOUS | Status: DC
Start: 2022-03-07 — End: 2022-03-20
  Administered 2022-03-06 – 2022-03-18 (×11): 6 via TOPICAL

## 2022-03-06 MED ORDER — TADALAFIL (PAH) 20 MG PO TABS
20.0000 mg | ORAL_TABLET | Freq: Every day | ORAL | Status: DC
  Administered 2022-03-07 – 2022-03-24 (×18): 20 mg via ORAL
  Filled 2022-03-06 (×18): qty 1

## 2022-03-06 MED ORDER — HYDRALAZINE HCL 20 MG/ML IJ SOLN: 2.0000 mg | Freq: Four times a day (QID) | INTRAMUSCULAR | Status: AC | PRN

## 2022-03-06 MED ORDER — HEPARIN (PORCINE) 25000 UT/250ML-% IV SOLN
950.0000 [IU]/h | INTRAVENOUS | Status: AC
  Administered 2022-03-06: 1100 [IU]/h via INTRAVENOUS
  Administered 2022-03-07 – 2022-03-08 (×2): 950 [IU]/h via INTRAVENOUS
  Filled 2022-03-06 (×3): qty 250

## 2022-03-06 MED ORDER — IOHEXOL 350 MG/ML SOLN
75.0000 mL | Freq: Once | INTRAVENOUS | Status: AC | PRN
  Administered 2022-03-06: 75 mL via INTRAVENOUS

## 2022-03-06 MED ORDER — ONDANSETRON HCL 4 MG/2ML IJ SOLN
4.0000 mg | Freq: Four times a day (QID) | INTRAMUSCULAR | Status: AC | PRN
  Administered 2022-03-07: 4 mg via INTRAVENOUS
  Filled 2022-03-06: qty 2

## 2022-03-06 MED ORDER — ACETAMINOPHEN 325 MG PO TABS
650.0000 mg | ORAL_TABLET | Freq: Four times a day (QID) | ORAL | Status: AC | PRN
  Administered 2022-03-09: 650 mg via ORAL
  Filled 2022-03-06: qty 2

## 2022-03-06 MED ORDER — ONDANSETRON HCL 4 MG PO TABS: 4.0000 mg | ORAL_TABLET | Freq: Four times a day (QID) | ORAL | Status: AC | PRN

## 2022-03-06 MED ORDER — ACETAMINOPHEN 650 MG RE SUPP: 650.0000 mg | Freq: Four times a day (QID) | RECTAL | Status: AC | PRN

## 2022-03-06 MED ORDER — SENNOSIDES-DOCUSATE SODIUM 8.6-50 MG PO TABS
1.0000 | ORAL_TABLET | Freq: Every evening | ORAL | Status: DC | PRN
  Administered 2022-03-09: 1 via ORAL
  Filled 2022-03-06: qty 1

## 2022-03-06 MED ORDER — GABAPENTIN 600 MG PO TABS
600.0000 mg | ORAL_TABLET | Freq: Three times a day (TID) | ORAL | Status: DC
  Administered 2022-03-06 – 2022-03-24 (×53): 600 mg via ORAL
  Filled 2022-03-06 (×53): qty 1

## 2022-03-06 MED ORDER — PANTOPRAZOLE SODIUM 40 MG PO TBEC
40.0000 mg | DELAYED_RELEASE_TABLET | Freq: Every day | ORAL | Status: DC
  Administered 2022-03-07 – 2022-03-24 (×18): 40 mg via ORAL
  Filled 2022-03-06 (×18): qty 1

## 2022-03-06 MED ORDER — MYCOPHENOLATE MOFETIL 250 MG PO CAPS
500.0000 mg | ORAL_CAPSULE | Freq: Two times a day (BID) | ORAL | Status: DC
  Administered 2022-03-06 – 2022-03-24 (×36): 500 mg via ORAL
  Filled 2022-03-06 (×36): qty 2

## 2022-03-06 NOTE — Progress Notes (Signed)
[place] for initiation and monitoring of heparin infusion Indication: pulmonary embolus  Allergies  Allergen Reactions   Hydroxychloroquine     headaches   Meclizine     headaches    Patient Measurements: Height: _0  (165.1 cm) Weight: 63.5 kg (139 lb 15.9 oz) IBW/kg (Calculated) : 57 Heparin Dosing Weight: 63.5 kg  Vital Signs: Temp: 97.7 F (36.5 C) (08/10 1209) Temp Source: Oral (08/10 1209) BP: 123/103 (08/10 1209) Pulse Rate: 103 (08/10 1209)  Labs: Recent Labs    03/06/22 1218 03/06/22 1340 03/06/22 1341  HGB  --  18.3*  --   HCT  --  55.6*  --   PLT  --  168  --   CREATININE 1.24*  --   --   TROPONINIHS  --   --  40*    Estimated Creatinine Clearance: 43.4 mL/min (A) (by C-G formula based on SCr of 1.24 mg/dL (H)).   Medical History: Past Medical History:  Diagnosis Date   COPD (chronic obstructive pulmonary disease) ([name])    Discoid lupus    Eczema    Esophageal dysmotility    Genital herpes    GERD (gastroesophageal reflux disease)    HSV (herpes simplex virus) infection    Hypertension    Lumbago    Lumbar disc disease    Peripheral neuropathy    Pulmonary fibrosis ([name])    Pulmonary hypertension ([name])    Raynaud's disease    Sclerodactyly    Scleroderma ([name])    Spinal stenosis     Assessment:  61 y.o. female w/ PMH of HTN, SLE, scleroderma, pulmonary fibrosis, pulmonary hypertension, and esophageal dysmotility. CT angio reveals Nonocclusive saddle pulmonary embolus extending to the left lower lobe segmental and subsegmental levels as well as into the proximal right main pulmonary artery. A review of medical records reveals no chronic anticoagulation  Goal of Therapy:  Heparin level 0.3-0.7 units/ml Monitor platelets by anticoagulation protocol: Yes   Plan:  Give 4000 units bolus x 1 Start heparin infusion at 1100 units/hr Check anti-Xa level in 6 hours and daily while on heparin Continue to  monitor H&H and platelets  Dallie Piles 03/06/2022,3:35 PM

## 2022-03-06 NOTE — H&P (Signed)
History and Physical   Tammy Barrett GHW:[id number] DOB: 1961-04-27 DOA: 03/06/2022  PCP: Dion Body, MD  Outpatient Specialists: Dr. [name], [facility] Pulmonology Patient coming from: Home  I have personally briefly reviewed patient's old medical records in [place].  Chief Concern: Poor appetite  HPI: [name] is a 61 year old female with hypertension, neuropathy, GERD, pulmonary hypertension, scleroderma, COPD, hepatosteatosis, mixed connective tissue disease, long-term use of immunosuppressant medication, pulmonary fibrosis, who presents to the emergency department for chief concerns of poor appetite.  Initial vitals in the emergency department showed temperature of 97.7, respiration rate of 16, heart rate of 103, blood pressure 123/103, SpO2 of 72% on room air with improvement to 95% on 5 L nasal cannula.  Serum sodium is 136, potassium 5.0, chloride 104, bicarb 22, BUN of 24, serum creatinine 1.24, GFR 50, nonfasting blood glucose 111, WBC 5.4, hemoglobin 18.3, platelets of 168.  Direct bili showed 0.9, T. bili 2.7, BNP was elevated at 1738.4, high sensitive troponin was 40.  ED treatment: Heparin GTT per pharmacy was initiated.  EDP consulted vascular who recommends heparin bolus.    Patient will be taken to the OR for thrombectomy in the a.m.  At bedside, patient was able to tell me her name, age, current calendar year, current location.  She appears stable and appears to be in no acute distress.  She reports the shortness of breath that was worse, persistent that started last night. She reports she felt this the other night as well. She endorses a cough that is not productive and started about 3 weeks ago. Sometimes it is productive of foamy white. She denies fever, chest pain, vomiting. She endorses nausea. She denies dysuria. She endorses looses stool, three times a day and started about one week ago. She was started on antibiotic about one week ago.    Social history: She lives with by herself. She is a former tobacco user, quitting in 2010. At her peak, she smoked 1/2 ppd. She denies etoh and recreational drug. She is disabled and formerly worked in Nurse, mental health.  ROS: Constitutional: no weight change, no fever ENT/Mouth: no sore throat, no rhinorrhea Eyes: no eye pain, no vision changes Cardiovascular: no chest pain, + dyspnea,  no edema, no palpitations Respiratory: no cough, no sputum, no wheezing Gastrointestinal: no nausea, no vomiting, no diarrhea, no constipation Genitourinary: no urinary incontinence, no dysuria, no hematuria Musculoskeletal: no arthralgias, no myalgias Skin: no skin lesions, no pruritus, Neuro: + weakness, no loss of consciousness, no syncope Psych: no anxiety, no depression, + decrease appetite Heme/Lymph: no bruising, no bleeding  ED Course: Discussed with emergency medicine provider, patient requiring hospitalization for chief concerns of pulmonary emboli.  Assessment/Plan  Principal Problem:   Pulmonary emboli ([name]) Active Problems:   Essential hypertension   Pulmonary fibrosis ([name])   Pulmonary hypertension ([name])   Mixed connective tissue disease ([name])   GERD (gastroesophageal reflux disease)   Acute on chronic respiratory failure with hypoxia ([name])   Assessment and Plan:  * Pulmonary emboli ([place]) - Admit patient to stepdown, inpatient - Continue heparin GTT per pharmacy - Complete echo ordered - Bilateral lower extremity ultrasound to assess for DVT ordered - Vascular was consulted and recommends admission and heparin bolus GTT with plans for thrombectomy in the a.m. - N.p.o. after midnight  Acute on chronic respiratory failure with hypoxia ([place]) - Presumptive diagnosis secondary to pulmonary emboli - Patient at baseline wears oxygen supplementation as needed - Continue oxygen supplementation to  maintain SpO2 greater than 92%  Mixed connective tissue disease ([place]) - Patient is  status post Plaquenil, prednisone, Imuran - Currently on mycophenolate 500 mg p.o. twice daily, resumed  Pulmonary fibrosis ([place]) Pulmonary hypertension - Continue outpatient follow-up with pulmonologist - Resumed home tadalafil 20 mg daily  Chart reviewed.   DVT prophylaxis: Heparin GTT Code Status: full code Diet: Heart healthy now, n.p.o. after midnight Family Communication: Updated sister, Onalee Hua and friend/decon at bedside with patient's permission Disposition Plan: Pending clinical course Consults called: Vascular Admission status: Inpatient, stepdown  Past Medical History:  Diagnosis Date   COPD (chronic obstructive pulmonary disease) ([place])    Discoid lupus    Eczema    Esophageal dysmotility    Genital herpes    GERD (gastroesophageal reflux disease)    HSV (herpes simplex virus) infection    Hypertension    Lumbago    Lumbar disc disease    Peripheral neuropathy    Pulmonary fibrosis ([place])    Pulmonary hypertension ([place])    Raynaud's disease    Sclerodactyly    Scleroderma ([place])    Spinal stenosis    Past Surgical History:  Procedure Laterality Date   bone removal from pinkie toe     COLONOSCOPY WITH PROPOFOL N/A 08/30/2021   Procedure: COLONOSCOPY WITH PROPOFOL;  Surgeon: [name], MD;  Location: [facility]OSCOPY;  Service: Gastroenterology;  Laterality: N/A;   DILATION AND CURETTAGE OF UTERUS     RIGHT HEART CATH Right 09/17/2018   Procedure: RIGHT HEART CATH;  Surgeon: Teodoro Spray, MD;  Location: [place] CV LAB;  Service: Cardiovascular;  Laterality: Right;   SPLENECTOMY     TUBAL LIGATION     Social History:  reports that she quit smoking about 11 years ago. Her smoking use included cigarettes. She smoked an average of .5 packs per day. She has never used smokeless tobacco. She reports that she does not drink alcohol and does not use drugs.  Allergies  Allergen Reactions   Hydroxychloroquine     headaches   Meclizine     headaches   Family  History  Problem Relation Age of Onset   Uterine cancer Mother    Stroke Father    Breast cancer Neg Hx    Family history: Family history reviewed and not pertinent.  Prior to Admission medications   Medication Sig Start Date End Date Taking? Authorizing Provider  amLODipine (NORVASC) 10 MG tablet Take 10 mg by mouth daily. 11/13/21   [provider]  amLODipine (NORVASC) 5 MG tablet Take 5 mg by mouth daily.    [provider]  aspirin 81 MG tablet Take 81 mg by mouth daily.    [provider]  Carboxymethylcellul-Glycerin (LUBRICATING EYE DROPS OP) Place 1 drop into both eyes daily as needed (dry eyes).    [provider]  fluticasone (FLONASE) 50 MCG/ACT nasal spray Place 1 spray into both nostrils daily as needed for allergies or rhinitis.    [provider]  furosemide (LASIX) 40 MG tablet Take 40 mg by mouth daily. 02/06/22   [provider]  gabapentin (NEURONTIN) 600 MG tablet Take 600 mg by mouth 3 (three) times daily.     [provider]  loratadine (CLARITIN) 10 MG tablet Take 10 mg by mouth daily as needed for allergies.    [provider]  Multiple Vitamins-Minerals (MULTIVITAMIN WITH MINERALS) tablet Take 1 tablet by mouth daily.    [provider]  mycophenolate (CELLCEPT) 500  MG tablet Take 500 mg by mouth 2 (two) times daily.    [provider]  naproxen sodium (ALEVE) 220 MG tablet Take 220 mg by mouth daily as needed (pain).    [provider]  omeprazole (PRILOSEC) 20 MG capsule Take 20 mg by mouth 2 (two) times daily.     [provider]  pantoprazole (PROTONIX) 40 MG tablet Take 40 mg by mouth daily. 10/01/21   [provider]  tadalafil, PAH, (ADCIRCA) 20 MG tablet TAKE 1 TABLET (20 MG TOTAL) BY MOUTH ONCE DAILY 06/20/19   [provider]   Physical Exam: Vitals:   03/06/22 1234 03/06/22 1543 03/06/22 1620 03/06/22 1700  BP:  (!) 133/107  (!)  147/107  Pulse:  (!) 101  (!) 104  Resp:  (!) 24  (!) 32  Temp:   98.1 F (36.7 C) 98 F (36.7 C)  TempSrc:   Oral Oral  SpO2:  95%  96%  Weight: 63.5 kg   64.6 kg  Height: _0  (1.651 m)   _1  (1.651 m)   Constitutional: appears age-appropriate, frail, NAD, calm, comfortable Eyes: PERRL, lids and conjunctivae normal ENMT: Mucous membranes are moist. Posterior pharynx clear of any exudate or lesions. Age-appropriate dentition. Hearing appropriate Neck: normal, supple, no masses, no thyromegaly Respiratory: clear to auscultation bilaterally, no wheezing, no crackles. Normal respiratory effort. No accessory muscle use.  Cardiovascular: Regular rate and rhythm, no murmurs / rubs / gallops. Bilateral lower extremity edema. 2+ pedal pulses. No carotid bruits.  Abdomen: no tenderness, no masses palpated, no hepatosplenomegaly. Bowel sounds positive.  Musculoskeletal: no clubbing / cyanosis. No joint deformity upper and lower extremities. Good ROM, no contractures, no atrophy. Normal muscle tone.  Skin: no rashes, lesions, ulcers. No induration Neurologic: Sensation intact. Strength 5/5 in all 4.  Psychiatric: Normal judgment and insight. Alert and oriented x 3. Normal mood.   EKG: independently reviewed, showing sinus rhythm with rate of 100, QTc 466  Chest x-ray on Admission: I personally reviewed and I agree with radiologist reading as below.  CT Angio Chest PE W/Cm &/Or Wo Cm  Result Date: 03/06/2022 CLINICAL DATA:  CT Abdomen Pelvis W Contrast suspected, high prob Abdominal pain, acute, nonlocalized EXAM: CT ANGIOGRAPHY CHEST WITH CONTRAST TECHNIQUE: Multidetector CT imaging of the chest was performed using the standard protocol during bolus administration of intravenous contrast. Multiplanar CT image reconstructions and MIPs were obtained to evaluate the vascular anatomy. RADIATION DOSE REDUCTION: This exam was performed according to the departmental dose-optimization program which  includes automated exposure control, adjustment of the mA and/or kV according to patient size and/or use of iterative reconstruction technique. CONTRAST:  31m OMNIPAQUE IOHEXOL 350 MG/ML SOLN COMPARISON:  CT chest 03/05/2020 FINDINGS: Cardiovascular: Satisfactory opacification of the pulmonary arteries to the segmental level. Nonocclusive saddle embolus extending to the left lower lobe segmental and subsegmental levels as well as into the proximal right main pulmonary artery. Enlarged right heart with marked enlargement of the right atria. There is increased right to left ventricular ratio of at least 1.5. Retrograde reflux of intravenous contrast into a dilated inferior vena cava and hepatic veins. The main pulmonary artery is enlarged measuring up to 3.7 cm. No significant pericardial effusion. The thoracic aorta is normal in caliber. No atherosclerotic plaque of the thoracic aorta. No coronary artery calcifications. Mediastinum/Nodes: No enlarged mediastinal, hilar, or axillary lymph nodes. Thyroid gland, trachea, and esophagus demonstrate no significant findings. Lungs/Pleura: Severe centrilobular and paraseptal emphysematous changes. Scattered  areas of reticulations, scarring, and bronchiectasis. No focal consolidation. 0.8 x 0.4 cm right middle lobe pulmonary nodule. Adjacent 0.7 x 0.6 cm ground-glass subpleural nodule (6:49). 0.5 x 0.5 cm left upper lobe pulmonary nodule (6:33). Left upper lobe pulmonary micronodule (6:27). Several other scattered pulmonary micronodules. No pulmonary mass. No pleural effusion. No pneumothorax. Upper Abdomen: Please see separately dictated CT abdomen pelvis 03/06/2022 Musculoskeletal: Subcutaneus soft tissue edema. No suspicious lytic or blastic osseous lesions. No acute displaced fracture. Multilevel degenerative changes of the spine. Chronic appearing minimal vertebral body height loss of the L4-L5 level. Review of the MIP images confirms the above findings. IMPRESSION: 1.  Nonocclusive saddle pulmonary embolus extending to the left lower lobe segmental and subsegmental levels as well as into the proximal right main pulmonary artery. Associated right heart strain. No pulmonary infarction. 2.  Emphysema (ICD10-J43.9) - severe. 3. Scattered pulmonary micronodules as well as a 0.8 x 0.4 cm right middle lobe pulmonary nodule and adjacent 0.7 x 0.6 cm ground-glass subpleural nodule. Initial follow-up with CT at 6 months is recommended to confirm persistence. If persistent, repeat CT is recommended every 2 years until 5 years of stability has been established. This recommendation follows the consensus statement: Guidelines for Management of Incidental Pulmonary Nodules Detected on CT Images: From the Fleischner Society 2017; Radiology 2017; 284:228-243. These results were called by telephone at the time of interpretation on 03/06/2022 at 3:21 pm to provider [facility] , who verbally acknowledged these results. Electronically Signed   By: Iven Finn M.D.   On: 03/06/2022 15:32   CT Abdomen Pelvis W Contrast  Result Date: 03/06/2022 CLINICAL DATA:  Abdominal pain EXAM: CT ABDOMEN AND PELVIS WITH CONTRAST TECHNIQUE: Multidetector CT imaging of the abdomen and pelvis was performed using the standard protocol following bolus administration of intravenous contrast. RADIATION DOSE REDUCTION: This exam was performed according to the departmental dose-optimization program which includes automated exposure control, adjustment of the mA and/or kV according to patient size and/or use of iterative reconstruction technique. CONTRAST:  20m OMNIPAQUE IOHEXOL 350 MG/ML SOLN COMPARISON:  None Available. FINDINGS: Lower chest: Centrilobular and panlobular emphysema is seen in the lower lung fields. Prominence of interstitial markings in the lower lung fields suggest scarring. Heart is enlarged in size. Hepatobiliary: Liver measures 18.3 cm in length. There is fatty infiltration. There is no  dilation of bile ducts. Small amount of fluid adjacent to the gallbladder may be part of ascites. There is no significant wall thickening in gallbladder. Gallbladder is not distended. Pancreas: No focal abnormalities are seen. Spleen: Spleen is not seen. Surgical clips on the left hemidiaphragms suggest possible previous splenectomy. Adrenals/Urinary Tract: There is mild hyperplasia of left adrenal. There is no hydronephrosis. There are no renal or ureteral stones. Urinary bladder is not distended. Stomach/Bowel: Stomach is not distended. Small bowel loops are not dilated. Appendix is not seen. There is no significant wall thickening in colon. Vascular/Lymphatic: Unremarkable. Reproductive: Unremarkable. Other: Small ascites is present. There is no pneumoperitoneum. Left inguinal hernia containing fat is seen. There is subcutaneous edema suggesting anasarca. Musculoskeletal: Slight decrease in height of upper end plate of body of L4 vertebra may be residual from previous injury. Degenerative changes are noted in the lumbar spine, more so at L4-L5 and L5-S1 levels. There is minimal retrolisthesis at L4-L5 level. IMPRESSION: There is no evidence of intestinal obstruction or pneumoperitoneum. There is no hydronephrosis. Enlarged fatty liver. Small ascites. Severe COPD. Lumbar spondylosis. Electronically Signed   By: PElmer Picker  M.D.   On: 03/06/2022 15:23   DG Chest 2 View  Result Date: 03/06/2022 CLINICAL DATA:  Shortness of breath. EXAM: CHEST - 2 VIEW COMPARISON:  CT scan of March 05, 2020. FINDINGS: Mild cardiomegaly is noted. Emphysematous disease is noted throughout both lungs as well as coarse reticular opacities concerning for scarring or fibrosis. No definite acute abnormality is noted. Bony thorax is unremarkable. IMPRESSION: Emphysematous changes noted bilaterally as well as coarse reticular opacities bilaterally concerning for fibrosis or scarring. No definite acute abnormality is noted. Aortic  Atherosclerosis (ICD10-I70.0) and Emphysema (ICD10-J43.9). Electronically Signed   By: Marijo Conception M.D.   On: 03/06/2022 14:05    Labs on Admission: I have personally reviewed following labs  CBC: Recent Labs  Lab 03/06/22 1340  WBC 5.4  NEUTROABS 3.9  HGB 18.3*  HCT 55.6*  MCV 95.5  PLT 147   Basic Metabolic Panel: Recent Labs  Lab 03/06/22 1218  NA 136  K 5.0  CL 104  CO2 22  GLUCOSE 111*  BUN 24*  CREATININE 1.24*  CALCIUM 8.8*   GFR: Estimated Creatinine Clearance: 43.4 mL/min (A) (by C-G formula based on SCr of 1.24 mg/dL (H)).  Liver Function Tests: Recent Labs  Lab 03/06/22 1218  AST 40  ALT 14  ALKPHOS 424*  BILITOT 2.7*  PROT 7.9  ALBUMIN 3.2*   Recent Labs  Lab 03/06/22 1341  LIPASE 26   CRITICAL CARE Performed by: Dr. Tobie Poet  Total critical care time: 35 minutes  Critical care time was exclusive of separately billable procedures and treating other patients.  Critical care was necessary to treat or prevent imminent or life-threatening deterioration.  Critical care was time spent personally by me on the following activities: development of treatment plan with patient and/or surrogate as well as nursing, discussions with consultants, evaluation of patient's response to treatment, examination of patient, obtaining history from patient or surrogate, ordering and performing treatments and interventions, ordering and review of laboratory studies, ordering and review of radiographic studies, pulse oximetry and re-evaluation of patient's condition.  Dr. Tobie Poet [facility]  If 7PM-7AM, please contact overnight-coverage provider If 7AM-7PM, please contact day coverage provider [email or web address]  03/06/2022, 6:06 PM

## 2022-03-06 NOTE — ED Triage Notes (Addendum)
Pt arrives from home for nausea and decreased appetite. Pt stating they are having trouble swallowing due to dry mouth. Pt stating this has been happening for weeks and they have not gotten any better.   Pt was just in [place] "Getting an injection in my eye"  PT has not been to PCP. But has an apt tomorrow. To be seen.

## 2022-03-06 NOTE — Assessment & Plan Note (Addendum)
Secondary to pulmonary emboli Patient at baseline wears oxygen supplementation as needed for emphysema   Continue oxygen supplementation to maintain SpO2 greater than 92%  Requiring HFNC at 6-10 L/min, may need to consult pulmonology if unable to wean down  Resuming diuresis w/ IV bolus lasix 08/14, then increased home po lasix from daily to bid  Pulmonology consulted 03/11/22, consider cardiology consult as well

## 2022-03-06 NOTE — ED Triage Notes (Signed)
Pt stating they have also had some extremity swelling and has been on fluid pills with no success.

## 2022-03-06 NOTE — H&P (View-Only) (Signed)
[facility] Vascular Consult Note  MRN : [id number]  Tammy Barrett is a 61 y.o. (03/16/1961) female who presents with chief complaint of  Chief Complaint  Patient presents with   Decreased Appetite   Leg Swelling  .   Consulting Physician:Amy Cox, MD Reason for consult: Pulmonary embolism History of Present Illness: Tammy Barrett is a 61 year old female that has a previous medical history of hypertension, pulmonary hypertension, COPD, scleroderma, Raynaud's disease, pulmonary fibrosis and neuropathy that presents to the emergency department initially for a poor appetite.  It was also noted that she had shortness of breath with exertion.  The patient notes that the shortness of breath has been worsening over the last several days.  She notes that she has had a cough that is actually been present for several weeks with frothy sputum.  She also notes that she has been having worsening swelling in her bilateral lower extremities despite increasing doses of diuretics.  Currently the patient wears oxygen at home but only as needed not continuously.  In the midst of workup it was noted that the patient had an O2 saturation of 76% on room air.  Ultimately was found that the patient was positive for a large saddle pulmonary embolism with right heart strain.  Current Facility-Administered Medications  Medication Dose Route Frequency Provider Last Rate Last Admin   acetaminophen (TYLENOL) tablet 650 mg  650 mg Oral Q6H PRN Cox, Amy N, DO       Or   acetaminophen (TYLENOL) suppository 650 mg  650 mg Rectal Q6H PRN Cox, Amy N, DO       [START ON 03/07/2022] amLODipine (NORVASC) tablet 10 mg  10 mg Oral Daily Cox, Amy N, DO       [START ON 03/07/2022] Chlorhexidine Gluconate Cloth 2 % PADS 6 each  6 each Topical Q0600 Cox, Amy N, DO   6 each at 03/06/22 1705   fluticasone (FLONASE) 50 MCG/ACT nasal spray 1 spray  1 spray Each Nare Daily PRN Cox, Amy N, DO       gabapentin  (NEURONTIN) tablet 600 mg  600 mg Oral TID Cox, Amy N, DO   600 mg at 03/06/22 2211   heparin ADULT infusion 100 units/mL (25000 units/243m)  1,100 Units/hr Intravenous Continuous Cox, Amy N, DO 11 mL/hr at 03/06/22 1700 1,100 Units/hr at 03/06/22 1700   hydrALAZINE (APRESOLINE) injection 2 mg  2 mg Intravenous Q6H PRN Cox, Amy N, DO       mycophenolate (CELLCEPT) capsule 500 mg  500 mg Oral BID Cox, Amy N, DO   500 mg at 03/06/22 2210   ondansetron (ZOFRAN) tablet 4 mg  4 mg Oral Q6H PRN Cox, Amy N, DO       Or   ondansetron (ZOFRAN) injection 4 mg  4 mg Intravenous Q6H PRN Cox, Amy N, DO       [START ON 03/07/2022] pantoprazole (PROTONIX) EC tablet 40 mg  40 mg Oral Daily Cox, Amy N, DO       senna-docusate (Senokot-S) tablet 1 tablet  1 tablet Oral QHS PRN Cox, Amy N, DO       [START ON 03/07/2022] tadalafil (PAH) (ADCIRCA) tablet 20 mg  20 mg Oral Daily Cox, Amy N, DO        Past Medical History:  Diagnosis Date   COPD ([place] obstructive [place] disease) [place]    Discoid lupus    Eczema    Esophageal dysmotility  Genital herpes    GERD (gastroesophageal reflux disease)    HSV (herpes simplex virus) infection    Hypertension    Lumbago    Lumbar disc disease    Peripheral neuropathy    Pulmonary fibrosis ([name])    Pulmonary hypertension ([name])    Raynaud's disease    Sclerodactyly    Scleroderma ([place])    Spinal stenosis     Past Surgical History:  Procedure Laterality Date   bone removal from pinkie toe     COLONOSCOPY WITH PROPOFOL N/A 08/30/2021   Procedure: COLONOSCOPY WITH PROPOFOL;  Surgeon: [name], MD;  Location: [facility] ENDOSCOPY;  Service: Gastroenterology;  Laterality: N/A;   DILATION AND CURETTAGE OF UTERUS     RIGHT HEART CATH Right 09/17/2018   Procedure: RIGHT HEART CATH;  Surgeon: Teodoro Spray, MD;  Location: [facility];  Service: Cardiovascular;  Laterality: Right;   SPLENECTOMY     TUBAL LIGATION      Social History Social History    Tobacco Use   Smoking status: Former    Packs/day: 0.50    Types: Cigarettes    Quit date: 01/08/2011    Years since quitting: 11.1   Smokeless tobacco: Never  Vaping Use   Vaping Use: Never used  Substance Use Topics   Alcohol use: No   Drug use: No    Family History Family History  Problem Relation Age of Onset   Uterine cancer Mother    Stroke Father    Breast cancer Neg Hx     Allergies  Allergen Reactions   Hydroxychloroquine     headaches   Meclizine     headaches     REVIEW OF SYSTEMS (Negative unless checked)  Constitutional: _0 Weight loss  _1 Fever  _2 Chills Cardiac: _3 Chest pain   _4 Chest pressure   _5 Palpitations   _6 Shortness of breath when laying flat   _7 Shortness of breath at rest   _8 Shortness of breath with exertion. Vascular:  _9 Pain in legs with walking   _10 Pain in legs at rest   _11 Pain in legs when laying flat   _12 Claudication   _13 Pain in feet when walking  _14 Pain in feet at rest  _15 Pain in feet when laying flat   _16 History of DVT   _17 Phlebitis   _18 Swelling in legs   _19 Varicose veins   _20 Non-healing ulcers Pulmonary:   _21 Uses home oxygen   _22 Productive cough   _23 Hemoptysis   _24 Wheeze  _25 COPD   _26 Asthma Neurologic:  _27 Dizziness  _28 Blackouts   _29 Seizures   _30 History of stroke   _31 History of TIA  _32 Aphasia   _33 Temporary blindness   _34 Dysphagia   _35 Weakness or numbness in arms   _36 Weakness or numbness in legs Musculoskeletal:  _37 Arthritis   _38 Joint swelling   _39 Joint pain   _40 Low back pain Hematologic:  _41 Easy bruising  _42 Easy bleeding   _43 Hypercoagulable state   _44 Anemic  _45 Hepatitis Gastrointestinal:  _46 Blood in stool   _47 Vomiting blood  _48 Gastroesophageal reflux/heartburn   _49 Difficulty swallowing. Genitourinary:  _50 Chronic kidney disease   _51 Difficult urination  _52 Frequent urination  _53 Burning with urination   _54 Blood in urine Skin:  _55 Rashes   _56 Ulcers   _57 Wounds Psychological:  _58 History of anxiety   _59  History of major depression.  Physical  Examination  Vitals:   03/06/22 2003 03/06/22 2005 03/06/22 2100 03/06/22 2200  BP:   (!) 122/95 (!) 127/101  Pulse: 96 97    Resp: (!) 25 (!) 24 (!) 27 (!) 32  Temp:  TempSrc:      SpO2: 90% 92%    Weight:      Height:       Body mass index is 23.7 kg/m. Gen:  WD/WN, NAD Head: [place]/AT, No temporalis wasting. Prominent temp pulse not noted. Ear/Nose/Throat: Hearing grossly intact, nares w/o erythema or drainage, oropharynx w/o Erythema/Exudate Eyes: Sclera non-icteric, conjunctiva clear Neck: Trachea midline.  No JVD.  Pulmonary:  Good air movement, respirations not labored, equal bilaterally.  Cardiac: RRR, normal S1, S2. Vascular: Dopplerable pulses bilaterally  Gastrointestinal: soft, non-tender/non-distended. No guarding/reflex.  Musculoskeletal: M/S 5/5 throughout.  Extremities without ischemic changes.  No deformity or atrophy.  1+ edema bilaterally Neurologic: Sensation grossly intact in extremities.  Symmetrical.  Speech is fluent. Motor exam as listed above. Psychiatric: Judgment intact, Mood & affect appropriate for pt's clinical situation. Dermatologic: No rashes or ulcers noted.  No cellulitis or open wounds. Lymph : No Cervical, Axillary, or Inguinal lymphadenopathy.    CBC Lab Results  Component Value Date   WBC 5.4 03/06/2022   HGB 18.3 (H) 03/06/2022   HCT 55.6 (H) 03/06/2022   MCV 95.5 03/06/2022   PLT 168 03/06/2022    BMET    Component Value Date/Time   NA 136 03/06/2022 1218   K 5.0 03/06/2022 1218   CL 104 03/06/2022 1218   CO2 22 03/06/2022 1218   GLUCOSE 111 (H) 03/06/2022 1218   BUN 24 (H) 03/06/2022 1218   CREATININE 1.24 (H) 03/06/2022 1218   CALCIUM 8.8 (L) 03/06/2022 1218   GFRNONAA 50 (L) 03/06/2022 1218   Estimated Creatinine Clearance: 43.4 mL/min (A) (by C-G formula based on SCr of 1.24 mg/dL (H)).  COAG Lab Results  Component Value Date   INR 1.7 (H) 03/06/2022    Radiology CT Angio Chest PE W/Cm &/Or Wo Cm  Result  Date: 03/06/2022 CLINICAL DATA:  CT Abdomen Pelvis W Contrast suspected, high prob Abdominal pain, acute, nonlocalized EXAM: CT ANGIOGRAPHY CHEST WITH CONTRAST TECHNIQUE: Multidetector CT imaging of the chest was performed using the standard protocol during bolus administration of intravenous contrast. Multiplanar CT image reconstructions and MIPs were obtained to evaluate the vascular anatomy. RADIATION DOSE REDUCTION: This exam was performed according to the departmental dose-optimization program which includes automated exposure control, adjustment of the mA and/or kV according to patient size and/or use of iterative reconstruction technique. CONTRAST:  49m OMNIPAQUE IOHEXOL 350 MG/ML SOLN COMPARISON:  CT chest 03/05/2020 FINDINGS: Cardiovascular: Satisfactory opacification of the pulmonary arteries to the segmental level. Nonocclusive saddle embolus extending to the left lower lobe segmental and subsegmental levels as well as into the proximal right main pulmonary artery. Enlarged right heart with marked enlargement of the right atria. There is increased right to left ventricular ratio of at least 1.5. Retrograde reflux of intravenous contrast into a dilated inferior vena cava and hepatic veins. The main pulmonary artery is enlarged measuring up to 3.7 cm. No significant pericardial effusion. The thoracic aorta is normal in caliber. No atherosclerotic plaque of the thoracic aorta. No coronary artery calcifications. Mediastinum/Nodes: No enlarged mediastinal, hilar, or axillary lymph nodes. Thyroid gland, trachea, and esophagus demonstrate no significant findings. Lungs/Pleura: Severe centrilobular and paraseptal emphysematous changes. Scattered areas of reticulations, scarring, and bronchiectasis. No focal consolidation. 0.8 x 0.4 cm right middle lobe pulmonary nodule. Adjacent 0.7 x 0.6 cm ground-glass subpleural nodule (6:49). 0.5 x 0.5 cm left upper lobe pulmonary nodule (6:33). Left upper lobe pulmonary  micronodule (6:27). Several other scattered pulmonary micronodules. No pulmonary mass. No  pleural effusion. No pneumothorax. Upper Abdomen: Please see separately dictated CT abdomen pelvis 03/06/2022 Musculoskeletal: Subcutaneus soft tissue edema. No suspicious lytic or blastic osseous lesions. No acute displaced fracture. Multilevel degenerative changes of the spine. Chronic appearing minimal vertebral body height loss of the L4-L5 level. Review of the MIP images confirms the above findings. IMPRESSION: 1. Nonocclusive saddle pulmonary embolus extending to the left lower lobe segmental and subsegmental levels as well as into the proximal right main pulmonary artery. Associated right heart strain. No pulmonary infarction. 2.  Emphysema (ICD10-J43.9) - severe. 3. Scattered pulmonary micronodules as well as a 0.8 x 0.4 cm right middle lobe pulmonary nodule and adjacent 0.7 x 0.6 cm ground-glass subpleural nodule. Initial follow-up with CT at 6 months is recommended to confirm persistence. If persistent, repeat CT is recommended every 2 years until 5 years of stability has been established. This recommendation follows the consensus statement: Guidelines for Management of Incidental Pulmonary Nodules Detected on CT Images: From the Fleischner Society 2017; Radiology 2017; 284:228-243. These results were called by telephone at the time of interpretation on 03/06/2022 at 3:21 pm to provider [facility] , who verbally acknowledged these results. Electronically Signed   By: Iven Finn M.D.   On: 03/06/2022 15:32   CT Abdomen Pelvis W Contrast  Result Date: 03/06/2022 CLINICAL DATA:  Abdominal pain EXAM: CT ABDOMEN AND PELVIS WITH CONTRAST TECHNIQUE: Multidetector CT imaging of the abdomen and pelvis was performed using the standard protocol following bolus administration of intravenous contrast. RADIATION DOSE REDUCTION: This exam was performed according to the departmental dose-optimization program which  includes automated exposure control, adjustment of the mA and/or kV according to patient size and/or use of iterative reconstruction technique. CONTRAST:  43m OMNIPAQUE IOHEXOL 350 MG/ML SOLN COMPARISON:  None Available. FINDINGS: Lower chest: Centrilobular and panlobular emphysema is seen in the lower lung fields. Prominence of interstitial markings in the lower lung fields suggest scarring. Heart is enlarged in size. Hepatobiliary: Liver measures 18.3 cm in length. There is fatty infiltration. There is no dilation of bile ducts. Small amount of fluid adjacent to the gallbladder may be part of ascites. There is no significant wall thickening in gallbladder. Gallbladder is not distended. Pancreas: No focal abnormalities are seen. Spleen: Spleen is not seen. Surgical clips on the left hemidiaphragms suggest possible previous splenectomy. Adrenals/Urinary Tract: There is mild hyperplasia of left adrenal. There is no hydronephrosis. There are no renal or ureteral stones. Urinary bladder is not distended. Stomach/Bowel: Stomach is not distended. Small bowel loops are not dilated. Appendix is not seen. There is no significant wall thickening in colon. Vascular/Lymphatic: Unremarkable. Reproductive: Unremarkable. Other: Small ascites is present. There is no pneumoperitoneum. Left inguinal hernia containing fat is seen. There is subcutaneous edema suggesting anasarca. Musculoskeletal: Slight decrease in height of upper end plate of body of L4 vertebra may be residual from previous injury. Degenerative changes are noted in the lumbar spine, more so at L4-L5 and L5-S1 levels. There is minimal retrolisthesis at L4-L5 level. IMPRESSION: There is no evidence of intestinal obstruction or pneumoperitoneum. There is no hydronephrosis. Enlarged fatty liver. Small ascites. Severe COPD. Lumbar spondylosis. Electronically Signed   By: PElmer PickerM.D.   On: 03/06/2022 15:23   DG Chest 2 View  Result Date:  03/06/2022 CLINICAL DATA:  Shortness of breath. EXAM: CHEST - 2 VIEW COMPARISON:  CT scan of March 05, 2020. FINDINGS: Mild cardiomegaly is noted. Emphysematous disease is noted throughout both lungs as well as coarse reticular  opacities concerning for scarring or fibrosis. No definite acute abnormality is noted. Bony thorax is unremarkable. IMPRESSION: Emphysematous changes noted bilaterally as well as coarse reticular opacities bilaterally concerning for fibrosis or scarring. No definite acute abnormality is noted. Aortic Atherosclerosis (ICD10-I70.0) and Emphysema (ICD10-J43.9). Electronically Signed   By: Marijo Conception M.D.   On: 03/06/2022 14:05      Assessment/Plan 1.  Pulmonary embolism  Given the patient's underlying pulmonary disease, as well as significance of pulmonary embolism and right heart strain, pulmonary thrombectomy would be in the patient's best interest.  I have discussed the procedure with the patient.  We discussed risk benefits and alternatives and the patient is agreeable to proceed.  Patient to continue with heparin drip.  2.  Raynaud's disease  Patient does have known Raynaud's disease.  Patient's feet are cool but with dopplerable pulses.  No other signs of acute ischemia.  This is likely related to her Raynaud's disease we will continue to monitor.    Family Communication: None at bedside  Thank you for allowing [place] to participate in the care of this patient.   Kris Hartmann, NP [facility] [phone] (Office Phone) [phone] (Office Fax) [phone] (Pager)  03/06/2022 10:13 PM  Staff may message me via secure chat in [place]  but this may not receive immediate response,  please page for urgent matters!  Dictation software was used to generate the above note. Typos may occur and escape review, as with typed/written notes. Any error is purely unintentional.  Please contact me directly for clarity if needed.

## 2022-03-06 NOTE — Assessment & Plan Note (Addendum)
stepdown, inpatient  heparin GTT per pharmacy  Echocardiogram --> LVEF 65 to 70%, normal function, normal diastolic parameters.  Right ventricular systolic function mildly reduced, moderately increased RV wall thickness, severely elevated pulmonary artery systolic pressure, right atrial size severely dilated.  DVT negative  Vascular surgery following, managing heparin gtt,  s/p thrombectomy 03/07/2022

## 2022-03-06 NOTE — ED Notes (Signed)
Pt SPO2 at 74% on 2L [place], pt O2 increased to 5L [place], pt SPO2 now at 93%. EDP [place] made aware

## 2022-03-06 NOTE — Assessment & Plan Note (Deleted)
-  Patient is on mycophenolate 500 mg p.o. twice daily, tadalafil, - Patient was started on rifaximin for 10-day course with last dose on 03/01/2022

## 2022-03-06 NOTE — ED Provider Notes (Signed)
[facility] Provider Note    Event Date/Time   First MD Initiated Contact with Patient 03/06/22 1222     (approximate)   History   Chief Complaint Decreased Appetite and Leg Swelling   HPI  Tammy Barrett is a 61 y.o. female with past medical history of hypertension, lupus, scleroderma, pulmonary fibrosis, pulmonary hypertension, and esophageal dysmotility who presents to the ED complaining of decreased appetite.  Patient reports that for the past couple of weeks she has had very little appetite along with some difficulty swallowing.  She states that she does not want to eat much and feels like her stomach is full all of the time.  When she does try to eat something, she states that it feels like it rolls around in her esophagus before eventually passing.  Due to this issue, she typically eats only very small amounts, has been doing okay with liquids.  She has been dealing with a dry cough for the past couple of weeks, deals with chronic shortness of breath due to her interstitial lung disease but states this has been slightly worse than usual.  She denies any fevers and has not had any pain in her chest, also denies any pain in her abdomen.  She does report that she has been dealing with increasing swelling in her legs, which has gotten worse despite a course of Lasix prescribed by her pulmonologist.  She reports wearing oxygen as needed, usually only requires it at night.      Physical Exam   Triage Vital Signs: ED Triage Vitals  Enc Vitals Group     BP 03/06/22 1209 (!) 123/103     Pulse Rate 03/06/22 1209 (!) 103     Resp 03/06/22 1209 16     Temp 03/06/22 1209 97.7 F (36.5 C)     Temp Source 03/06/22 1209 Oral     SpO2 03/06/22 1209 (!) 72 %     Weight 03/06/22 1210 140 lb (63.5 kg)     Height 03/06/22 1234 _0  (1.651 m)     Head Circumference --      Peak Flow --      Pain Score 03/06/22 1210 0     Pain Loc --      Pain Edu? --      Excl.  in [place]? --     Most recent vital signs: Vitals:   03/06/22 1543 03/06/22 1620  BP: (!) 133/107   Pulse: (!) 101   Resp: (!) 24   Temp:  98.1 F (36.7 C)  SpO2: 95%     Constitutional: Alert and oriented. Eyes: Conjunctivae are normal. Head: Atraumatic. Nose: No congestion/rhinnorhea. Mouth/Throat: Mucous membranes are moist.  Cardiovascular: Normal rate, regular rhythm. Grossly normal heart sounds.  2+ radial pulses bilaterally. Respiratory: Normal respiratory effort.  No retractions. Lungs with fine crackles bilaterally. Gastrointestinal: Soft and nontender. No distention. Musculoskeletal: No lower extremity tenderness, 3+ pitting edema to knees bilaterally. Neurologic:  Normal speech and language. No gross focal neurologic deficits are appreciated.    ED Results / Procedures / Treatments   Labs (all labs ordered are listed, but only abnormal results are displayed) Labs Reviewed  COMPREHENSIVE METABOLIC PANEL - Abnormal; Notable for the following components:      Result Value   Glucose, Bld 111 (*)    BUN 24 (*)    Creatinine, Ser 1.24 (*)    Calcium 8.8 (*)    Albumin 3.2 (*)  Alkaline Phosphatase 424 (*)    Total Bilirubin 2.7 (*)    GFR, Estimated 50 (*)    All other components within normal limits  BILIRUBIN, DIRECT - Abnormal; Notable for the following components:   Bilirubin, Direct 0.9 (*)    All other components within normal limits  BRAIN NATRIURETIC PEPTIDE - Abnormal; Notable for the following components:   B Natriuretic Peptide 1,738.4 (*)    All other components within normal limits  CBC WITH DIFFERENTIAL/PLATELET - Abnormal; Notable for the following components:   RBC 5.82 (*)    Hemoglobin 18.3 (*)    HCT 55.6 (*)    RDW 18.6 (*)    nRBC 1.3 (*)    All other components within normal limits  TROPONIN I (HIGH SENSITIVITY) - Abnormal; Notable for the following components:   Troponin I (High Sensitivity) 40 (*)    All other components within  normal limits  LIPASE, BLOOD  CBC WITH DIFFERENTIAL/PLATELET  CBC WITH DIFFERENTIAL/PLATELET  HEPARIN LEVEL (UNFRACTIONATED)  PROTIME-INR  APTT  HIV ANTIBODY (ROUTINE TESTING W REFLEX)     EKG  ED ECG REPORT I, Blake Divine, the attending physician, personally viewed and interpreted this ECG.   Date: 03/06/2022  EKG Time: 14:35  Rate: 100  Rhythm: sinus tachycardia  Axis: RAD  Intervals:none  ST&T Change: Biatrial enlargement, NO ST/T changes  RADIOLOGY Chest x-ray reviewed and interpreted by me, no focal infiltrate noted but reticular opacities consistent with fibrosis noted.  PROCEDURES:  Critical Care performed: No  .Critical Care  Performed by: Blake Divine, MD Authorized by: Blake Divine, MD   Critical care provider statement:    Critical care time (minutes):  30   Critical care time was exclusive of:  Separately billable procedures and treating other patients and teaching time   Critical care was necessary to treat or prevent imminent or life-threatening deterioration of the following conditions:  Respiratory failure   Critical care was time spent personally by me on the following activities:  Development of treatment plan with patient or surrogate, discussions with consultants, evaluation of patient's response to treatment, examination of patient, ordering and review of laboratory studies, ordering and review of radiographic studies, ordering and performing treatments and interventions, pulse oximetry, re-evaluation of patient's condition and review of old charts   I assumed direction of critical care for this patient from another provider in my specialty: no     Care discussed with: admitting provider      MEDICATIONS ORDERED IN ED: Medications  heparin ADULT infusion 100 units/mL (25000 units/280m) (1,100 Units/hr Intravenous New Bag/Given 03/06/22 1613)  acetaminophen (TYLENOL) tablet 650 mg (has no administration in time range)    Or  acetaminophen  (TYLENOL) suppository 650 mg (has no administration in time range)  ondansetron (ZOFRAN) tablet 4 mg (has no administration in time range)    Or  ondansetron (ZOFRAN) injection 4 mg (has no administration in time range)  senna-docusate (Senokot-S) tablet 1 tablet (has no administration in time range)  hydrALAZINE (APRESOLINE) injection 2 mg (has no administration in time range)  iohexol (OMNIPAQUE) 350 MG/ML injection 75 mL (75 mLs Intravenous Contrast Given 03/06/22 1445)  heparin bolus via infusion 4,000 Units (4,000 Units Intravenous Bolus from Bag 03/06/22 1613)     IMPRESSION / MDM / ABollinger/ ED COURSE  I reviewed the triage vital signs and the nursing notes.  61 y.o. female with past medical history of hypertension, scleroderma, lupus, pulmonary fibrosis, and pulmonary hypertension who presents to the ED complaining of decreased appetite for the past couple of weeks, now with some increasing difficulty breathing and leg swelling.  Patient's presentation is most consistent with acute presentation with potential threat to life or bodily function.  Differential diagnosis includes, but is not limited to, exacerbation of interstitial lung disease, COPD, CHF, pulmonary hypertension, PE, pneumonia, malignancy, GERD.  Patient nontoxic-appearing and in no acute distress, however noted to have oxygen saturations of 76% on room air.  She was initially placed on her usual 2 L nasal cannula with improvement, chest x-ray appeared consistent with her known interstitial lung disease, no findings suggestive of CHF.  EKG shows sinus tachycardia with no ischemic changes, troponin mildly elevated but low suspicion for ACS at this time.  Patient does have elevation in her alkaline phosphatase as well as bilirubin, appears to have component of both indirect and direct bilirubin.  Abdominal exam is benign, but given her significant difficulties with swallowing and fullness  in her abdomen, CT imaging was performed of her abdomen in addition to CTA of her chest.  CTA of chest is positive for large saddle pulmonary embolism with right heart strain, likely the cause of elevated BNP as well as mild troponin elevation.  Patient was started on heparin and case discussed with Dr. Lucky Cowboy, who will plan for thrombectomy tomorrow.  On recheck, patient noted to drop oxygen saturations to the mid 70s on 2 L nasal cannula, increased to 5 L nasal cannula with saturations of 93%.  She remains hemodynamically stable, reports minimal difficulty breathing at this time.  Case discussed with hospitalist for admission.      FINAL CLINICAL IMPRESSION(S) / ED DIAGNOSES   Final diagnoses:  Acute saddle pulmonary embolism with acute cor pulmonale ([name])  Acute respiratory failure with hypoxia ([place])     Rx / DC Orders   ED Discharge Orders     None        Note:  This document was prepared using Dragon voice recognition software and may include unintentional dictation errors.   Blake Divine, MD 03/06/22 1630

## 2022-03-06 NOTE — Hospital Course (Addendum)
[name] is a 61 year old female with hypertension, neuropathy, GERD, pulmonary hypertension, scleroderma, COPD, hepatosteatosis, mixed connective tissue disease, long-term use of immunosuppressant medication, pulmonary fibrosis, who presents to the emergency department 03/06/2022 for chief concerns of poor appetite. 08/10: Initial vitals heart rate of 103, blood pressure 123/103, SpO2 of 72% on room air with improvement to 95% on 5 L nasal cannula. BNP was elevated at 1738.4, high sensitive troponin was 40, creatinine 1.24.  No concerns on CT abdomen/pelvis.  CTA chest (+)Nonocclusive saddle pulmonary embolus extending to the left lower lobe segmental and subsegmental levels as well as into the proximal right main pulmonary artery. Associated right heart strain. No pulmonary infarction.  Severe emphysema, multiple pulmonary nodules to follow-up outpatient. Heparin GTT per pharmacy was initiated.  Patient was admitted to hospitalist service stepdown status with vascular consult for saddle PE. EDP had already consulted vascular who recommended heparin bolus and patient to be taken to the OR for thrombectomy in the a.m.  08/11: thrombectomy planned. I was contacted by radiologist Dr. Denna Haggard this morning and due to abnormal appearance of presumed clot and no evidence for DVT as well as significantly dilated RA, radiology is recommending CT chest with contrast to evaluate for potential mediastinal mass as opposed to pulmonary embolus, prior to taking her for thrombectomy.  Confirmed PE.  Also noted 2.3 cm right hilar lymph node, possible inflammatory, recommending follow-up CT with IV contrast 2 to 3 months. Echocardiogram --> LVEF 65 to 70%, normal function, normal diastolic parameters.  Right ventricular systolic function mildly reduced, moderately increased RV wall thickness, severely elevated pulmonary artery systolic pressure, right atrial size severely dilated.  Underwent thrombolysis/thrombectomy.   08/12: Remains on heparin drip.  HFNC - requiring 10 L/min this morning but improved to 5 L/min later in the day. Pt reports SOB is overall better. Transferred to progressive status.  08/13: still requiring 6 /min O2. D/c heparin gtt 48 hours after thrombectomy and transition Eliquis. RN voiced concern low UOP, continue strict I&O and encouraged po intake  08/14: requiring 8 L/min overnight and maintaining on 5 L/min O2 this morning, no output documented from yesterday. Rales/coarse breath sounds and JVD --> Lasix 60 mg IV x1 and restart home lasix. CXR obtained to futher eval possible underlying pneumonia - images reviewed, stable cardiomegaly and severe background emphysema no new consolidations.  08/15: paged pulmonary Dr [name] 10:53 AM for routine consult. Pt appears fluid overloaded based on LE edema, rales, JVD but given pulm HTN and underlying lung disease will also seek pulmonary recs if any other interventions might be helpful. Continue lasix for now and strict I&O, daily weights.  08/16: still needing 9L/min, confirmed pulmonary to see her today.  8/17: On 8 L nasal cannula oxygen.  Pulmonary following 8/18: on high flow with FiO2 of 35% and tolerating well with sat in mid-high 90's 8/19: Continues on high flow with FiO2 45%.  Continues to have cough with whitish sputum production. 8/20: Able to wean back to 8 L of oxygen.  Uses 3 L at baseline.  PT/OT are recommending SNF 8/21: Clinically seems stable, at 7 L of oxygen. 8/22: Remains stable and able to wean to 5 L. 8/23: Remains stable, apparently was put back to 7 L overnight, able to wean her back to 5 L again.  Patient do show some transient desaturation with ambulation, discussed with pulmonary and they are okay with transient desaturation and keeping the saturation around 88%. Had a bed offer-pending insurance authorization 8/25:  Patient remained stable.  Still pending insurance authorization. 8/26: Received insurance authorization  but SNF will not be able to take her before Monday.  Remained on 5 L. 8/27: Stable and hopefully leave tomorrow as facility was unable to take new patient over the weekend. 8/28: Patient remained stable and is being discharged to SNF for further rehab. Her home dose of aspirin was discontinued as she was started on Eliquis. She will continue on current medications and need to have a close follow-up with her pulmonologist and other providers.

## 2022-03-06 NOTE — Consult Note (Signed)
[facility] Consult Note  MRN : [id number]  Tammy Barrett is a 61 y.o. (03/16/1961) female who presents with chief complaint of  Chief Complaint  Patient presents with   Decreased Appetite   Leg Swelling  .   Consulting Physician:Amy Cox, MD Reason for consult: Pulmonary embolism History of Present Illness: Tammy Barrett is a 61 year old female that has a previous medical history of hypertension, pulmonary hypertension, COPD, scleroderma, Raynaud's disease, pulmonary fibrosis and neuropathy that presents to the emergency department initially for a poor appetite.  It was also noted that she had shortness of breath with exertion.  The patient notes that the shortness of breath has been worsening over the last several days.  She notes that she has had a cough that is actually been present for several weeks with frothy sputum.  She also notes that she has been having worsening swelling in her bilateral lower extremities despite increasing doses of diuretics.  Currently the patient wears oxygen at home but only as needed not continuously.  In the midst of workup it was noted that the patient had an O2 saturation of 76% on room air.  Ultimately was found that the patient was positive for a large saddle pulmonary embolism with right heart strain.  Current Facility-Administered Medications  Medication Dose Route Frequency Provider Last Rate Last Admin   acetaminophen (TYLENOL) tablet 650 mg  650 mg Oral Q6H PRN Cox, Amy N, DO       Or   acetaminophen (TYLENOL) suppository 650 mg  650 mg Rectal Q6H PRN Cox, Amy N, DO       [START ON 03/07/2022] amLODipine (NORVASC) tablet 10 mg  10 mg Oral Daily Cox, Amy N, DO       [START ON 03/07/2022] Chlorhexidine Gluconate Cloth 2 % PADS 6 each  6 each Topical Q0600 Cox, Amy N, DO   6 each at 03/06/22 1705   fluticasone (FLONASE) 50 MCG/ACT nasal spray 1 spray  1 spray Each Nare Daily PRN Cox, Amy N, DO       gabapentin  (NEURONTIN) tablet 600 mg  600 mg Oral TID Cox, Amy N, DO   600 mg at 03/06/22 2211   heparin ADULT infusion 100 units/mL (25000 units/243m)  1,100 Units/hr Intravenous Continuous Cox, Amy N, DO 11 mL/hr at 03/06/22 1700 1,100 Units/hr at 03/06/22 1700   hydrALAZINE (APRESOLINE) injection 2 mg  2 mg Intravenous Q6H PRN Cox, Amy N, DO       mycophenolate (CELLCEPT) capsule 500 mg  500 mg Oral BID Cox, Amy N, DO   500 mg at 03/06/22 2210   ondansetron (ZOFRAN) tablet 4 mg  4 mg Oral Q6H PRN Cox, Amy N, DO       Or   ondansetron (ZOFRAN) injection 4 mg  4 mg Intravenous Q6H PRN Cox, Amy N, DO       [START ON 03/07/2022] pantoprazole (PROTONIX) EC tablet 40 mg  40 mg Oral Daily Cox, Amy N, DO       senna-docusate (Senokot-S) tablet 1 tablet  1 tablet Oral QHS PRN Cox, Amy N, DO       [START ON 03/07/2022] tadalafil (PAH) (ADCIRCA) tablet 20 mg  20 mg Oral Daily Cox, Amy N, DO        Past Medical History:  Diagnosis Date   COPD [place] obstructive [place] disease) ([place]    Discoid lupus    Eczema    Esophageal dysmotility  Genital herpes    GERD (gastroesophageal reflux disease)    HSV (herpes simplex virus) infection    Hypertension    Lumbago    Lumbar disc disease    Peripheral neuropathy    Pulmonary fibrosis ([name])    Pulmonary hypertension ([name])    Raynaud's disease    Sclerodactyly    Scleroderma ([place])    Spinal stenosis     Past Surgical History:  Procedure Laterality Date   bone removal from pinkie toe     COLONOSCOPY WITH PROPOFOL N/A 08/30/2021   Procedure: COLONOSCOPY WITH PROPOFOL;  Surgeon: [name], MD;  Location: [facility] ENDOSCOPY;  Service: Gastroenterology;  Laterality: N/A;   DILATION AND CURETTAGE OF UTERUS     RIGHT HEART CATH Right 09/17/2018   Procedure: RIGHT HEART CATH;  Surgeon: Teodoro Spray, MD;  Location: [facility];  Service: Cardiovascular;  Laterality: Right;   SPLENECTOMY     TUBAL LIGATION      Social History Social History    Tobacco Use   Smoking status: Former    Packs/day: 0.50    Types: Cigarettes    Quit date: 01/08/2011    Years since quitting: 11.1   Smokeless tobacco: Never  Vaping Use   Vaping Use: Never used  Substance Use Topics   Alcohol use: No   Drug use: No    Family History Family History  Problem Relation Age of Onset   Uterine cancer Mother    Stroke Father    Breast cancer Neg Hx     Allergies  Allergen Reactions   Hydroxychloroquine     headaches   Meclizine     headaches     REVIEW OF SYSTEMS (Negative unless checked)  Constitutional: _0 Weight loss  _1 Fever  _2 Chills Cardiac: _3 Chest pain   _4 Chest pressure   _5 Palpitations   _6 Shortness of breath when laying flat   _7 Shortness of breath at rest   _8 Shortness of breath with exertion. Vascular:  _9 Pain in legs with walking   _10 Pain in legs at rest   _11 Pain in legs when laying flat   _12 Claudication   _13 Pain in feet when walking  _14 Pain in feet at rest  _15 Pain in feet when laying flat   _16 History of DVT   _17 Phlebitis   _18 Swelling in legs   _19 Varicose veins   _20 Non-healing ulcers Pulmonary:   _21 Uses home oxygen   _22 Productive cough   _23 Hemoptysis   _24 Wheeze  _25 COPD   _26 Asthma Neurologic:  _27 Dizziness  _28 Blackouts   _29 Seizures   _30 History of stroke   _31 History of TIA  _32 Aphasia   _33 Temporary blindness   _34 Dysphagia   _35 Weakness or numbness in arms   _36 Weakness or numbness in legs Musculoskeletal:  _37 Arthritis   _38 Joint swelling   _39 Joint pain   _40 Low back pain Hematologic:  _41 Easy bruising  _42 Easy bleeding   _43 Hypercoagulable state   _44 Anemic  _45 Hepatitis Gastrointestinal:  _46 Blood in stool   _47 Vomiting blood  _48 Gastroesophageal reflux/heartburn   _49 Difficulty swallowing. Genitourinary:  _50 Chronic kidney disease   _51 Difficult urination  _52 Frequent urination  _53 Burning with urination   _54 Blood in urine Skin:  _55 Rashes   _56 Ulcers   _57 Wounds Psychological:  _58 History of anxiety   _59  History of major depression.  Physical  Examination  Vitals:   03/06/22 2003 03/06/22 2005 03/06/22 2100 03/06/22 2200  BP:   (!) 122/95 (!) 127/101  Pulse: 96 97    Resp: (!) 25 (!) 24 (!) 27 (!) 32  Temp:  TempSrc:      SpO2: 90% 92%    Weight:      Height:       Body mass index is 23.7 kg/m. Gen:  WD/WN, NAD Head: [place]/AT, No temporalis wasting. Prominent temp pulse not noted. Ear/Nose/Throat: Hearing grossly intact, nares w/o erythema or drainage, oropharynx w/o Erythema/Exudate Eyes: Sclera non-icteric, conjunctiva clear Neck: Trachea midline.  No JVD.  Pulmonary:  Good air movement, respirations not labored, equal bilaterally.  Cardiac: RRR, normal S1, S2. Vascular: Dopplerable pulses bilaterally  Gastrointestinal: soft, non-tender/non-distended. No guarding/reflex.  Musculoskeletal: M/S 5/5 throughout.  Extremities without ischemic changes.  No deformity or atrophy.  1+ edema bilaterally Neurologic: Sensation grossly intact in extremities.  Symmetrical.  Speech is fluent. Motor exam as listed above. Psychiatric: Judgment intact, Mood & affect appropriate for Tammy Barrett's clinical situation. Dermatologic: No rashes or ulcers noted.  No cellulitis or open wounds. Lymph : No Cervical, Axillary, or Inguinal lymphadenopathy.    CBC Lab Results  Component Value Date   WBC 5.4 03/06/2022   HGB 18.3 (H) 03/06/2022   HCT 55.6 (H) 03/06/2022   MCV 95.5 03/06/2022   PLT 168 03/06/2022    BMET    Component Value Date/Time   NA 136 03/06/2022 1218   K 5.0 03/06/2022 1218   CL 104 03/06/2022 1218   CO2 22 03/06/2022 1218   GLUCOSE 111 (H) 03/06/2022 1218   BUN 24 (H) 03/06/2022 1218   CREATININE 1.24 (H) 03/06/2022 1218   CALCIUM 8.8 (L) 03/06/2022 1218   GFRNONAA 50 (L) 03/06/2022 1218   Estimated Creatinine Clearance: 43.4 mL/min (A) (by C-G formula based on SCr of 1.24 mg/dL (H)).  COAG Lab Results  Component Value Date   INR 1.7 (H) 03/06/2022    Radiology CT Angio Chest PE W/Cm &/Or Wo Cm  Result  Date: 03/06/2022 CLINICAL DATA:  CT Abdomen Pelvis W Contrast suspected, high prob Abdominal pain, acute, nonlocalized EXAM: CT ANGIOGRAPHY CHEST WITH CONTRAST TECHNIQUE: Multidetector CT imaging of the chest was performed using the standard protocol during bolus administration of intravenous contrast. Multiplanar CT image reconstructions and MIPs were obtained to evaluate the vascular anatomy. RADIATION DOSE REDUCTION: This exam was performed according to the departmental dose-optimization program which includes automated exposure control, adjustment of the mA and/or kV according to patient size and/or use of iterative reconstruction technique. CONTRAST:  49m OMNIPAQUE IOHEXOL 350 MG/ML SOLN COMPARISON:  CT chest 03/05/2020 FINDINGS: Cardiovascular: Satisfactory opacification of the pulmonary arteries to the segmental level. Nonocclusive saddle embolus extending to the left lower lobe segmental and subsegmental levels as well as into the proximal right main pulmonary artery. Enlarged right heart with marked enlargement of the right atria. There is increased right to left ventricular ratio of at least 1.5. Retrograde reflux of intravenous contrast into a dilated inferior vena cava and hepatic veins. The main pulmonary artery is enlarged measuring up to 3.7 cm. No significant pericardial effusion. The thoracic aorta is normal in caliber. No atherosclerotic plaque of the thoracic aorta. No coronary artery calcifications. Mediastinum/Nodes: No enlarged mediastinal, hilar, or axillary lymph nodes. Thyroid gland, trachea, and esophagus demonstrate no significant findings. Lungs/Pleura: Severe centrilobular and paraseptal emphysematous changes. Scattered areas of reticulations, scarring, and bronchiectasis. No focal consolidation. 0.8 x 0.4 cm right middle lobe pulmonary nodule. Adjacent 0.7 x 0.6 cm ground-glass subpleural nodule (6:49). 0.5 x 0.5 cm left upper lobe pulmonary nodule (6:33). Left upper lobe pulmonary  micronodule (6:27). Several other scattered pulmonary micronodules. No pulmonary mass. No  pleural effusion. No pneumothorax. Upper Abdomen: Please see separately dictated CT abdomen pelvis 03/06/2022 Musculoskeletal: Subcutaneus soft tissue edema. No suspicious lytic or blastic osseous lesions. No acute displaced fracture. Multilevel degenerative changes of the spine. Chronic appearing minimal vertebral body height loss of the L4-L5 level. Review of the MIP images confirms the above findings. IMPRESSION: 1. Nonocclusive saddle pulmonary embolus extending to the left lower lobe segmental and subsegmental levels as well as into the proximal right main pulmonary artery. Associated right heart strain. No pulmonary infarction. 2.  Emphysema (ICD10-J43.9) - severe. 3. Scattered pulmonary micronodules as well as a 0.8 x 0.4 cm right middle lobe pulmonary nodule and adjacent 0.7 x 0.6 cm ground-glass subpleural nodule. Initial follow-up with CT at 6 months is recommended to confirm persistence. If persistent, repeat CT is recommended every 2 years until 5 years of stability has been established. This recommendation follows the consensus statement: Guidelines for Management of Incidental Pulmonary Nodules Detected on CT Images: From the Fleischner Society 2017; Radiology 2017; 284:228-243. These results were called by telephone at the time of interpretation on 03/06/2022 at 3:21 pm to provider [facility] , who verbally acknowledged these results. Electronically Signed   By: Iven Finn M.D.   On: 03/06/2022 15:32   CT Abdomen Pelvis W Contrast  Result Date: 03/06/2022 CLINICAL DATA:  Abdominal pain EXAM: CT ABDOMEN AND PELVIS WITH CONTRAST TECHNIQUE: Multidetector CT imaging of the abdomen and pelvis was performed using the standard protocol following bolus administration of intravenous contrast. RADIATION DOSE REDUCTION: This exam was performed according to the departmental dose-optimization program which  includes automated exposure control, adjustment of the mA and/or kV according to patient size and/or use of iterative reconstruction technique. CONTRAST:  69m OMNIPAQUE IOHEXOL 350 MG/ML SOLN COMPARISON:  None Available. FINDINGS: Lower chest: Centrilobular and panlobular emphysema is seen in the lower lung fields. Prominence of interstitial markings in the lower lung fields suggest scarring. Heart is enlarged in size. Hepatobiliary: Liver measures 18.3 cm in length. There is fatty infiltration. There is no dilation of bile ducts. Small amount of fluid adjacent to the gallbladder may be part of ascites. There is no significant wall thickening in gallbladder. Gallbladder is not distended. Pancreas: No focal abnormalities are seen. Spleen: Spleen is not seen. Surgical clips on the left hemidiaphragms suggest possible previous splenectomy. Adrenals/Urinary Tract: There is mild hyperplasia of left adrenal. There is no hydronephrosis. There are no renal or ureteral stones. Urinary bladder is not distended. Stomach/Bowel: Stomach is not distended. Small bowel loops are not dilated. Appendix is not seen. There is no significant wall thickening in colon. Vascular/Lymphatic: Unremarkable. Reproductive: Unremarkable. Other: Small ascites is present. There is no pneumoperitoneum. Left inguinal hernia containing fat is seen. There is subcutaneous edema suggesting anasarca. Musculoskeletal: Slight decrease in height of upper end plate of body of L4 vertebra may be residual from previous injury. Degenerative changes are noted in the lumbar spine, more so at L4-L5 and L5-S1 levels. There is minimal retrolisthesis at L4-L5 level. IMPRESSION: There is no evidence of intestinal obstruction or pneumoperitoneum. There is no hydronephrosis. Enlarged fatty liver. Small ascites. Severe COPD. Lumbar spondylosis. Electronically Signed   By: PElmer PickerM.D.   On: 03/06/2022 15:23   DG Chest 2 View  Result Date:  03/06/2022 CLINICAL DATA:  Shortness of breath. EXAM: CHEST - 2 VIEW COMPARISON:  CT scan of March 05, 2020. FINDINGS: Mild cardiomegaly is noted. Emphysematous disease is noted throughout both lungs as well as coarse reticular  opacities concerning for scarring or fibrosis. No definite acute abnormality is noted. Bony thorax is unremarkable. IMPRESSION: Emphysematous changes noted bilaterally as well as coarse reticular opacities bilaterally concerning for fibrosis or scarring. No definite acute abnormality is noted. Aortic Atherosclerosis (ICD10-I70.0) and Emphysema (ICD10-J43.9). Electronically Signed   By: Marijo Conception M.D.   On: 03/06/2022 14:05      Assessment/Plan 1.  Pulmonary embolism  Given the patient's underlying pulmonary disease, as well as significance of pulmonary embolism and right heart strain, pulmonary thrombectomy would be in the patient's best interest.  I have discussed the procedure with the patient.  We discussed risk benefits and alternatives and the patient is agreeable to proceed.  Patient to continue with heparin drip.  2.  Raynaud's disease  Patient does have known Raynaud's disease.  Patient's feet are cool but with dopplerable pulses.  No other signs of acute ischemia.  This is likely related to her Raynaud's disease we will continue to monitor.    Family Communication: None at bedside  Thank you for allowing [place] to participate in the care of this patient.   Kris Hartmann, NP [facility] [phone] (Office Phone) [phone] (Office Fax) [phone] (Pager)  03/06/2022 10:13 PM  Staff may message me via secure chat in [place]  but this may not receive immediate response,  please page for urgent matters!  Dictation software was used to generate the above note. Typos may occur and escape review, as with typed/written notes. Any error is purely unintentional.  Please contact me directly for clarity if needed.

## 2022-03-06 NOTE — Assessment & Plan Note (Addendum)
Pulmonary hypertension  Pulmonology following  Continue home tadalafil 20 mg daily

## 2022-03-06 NOTE — Assessment & Plan Note (Addendum)
   status post Plaquenil, prednisone, Imuran  Currently on mycophenolate 500 mg p.o. twice daily, resumed

## 2022-03-07 ENCOUNTER — Inpatient Hospital Stay: Payer: Medicare Other

## 2022-03-07 ENCOUNTER — Encounter
Admission: EM | Disposition: A | Discharge status: Skilled Nursing Facility | Payer: Self-pay | Source: Home / Self Care | Attending: Internal Medicine

## 2022-03-07 ENCOUNTER — Encounter: Payer: Self-pay | Admitting: Internal Medicine

## 2022-03-07 ENCOUNTER — Inpatient Hospital Stay
Admit: 2022-03-07 | Discharge: 2022-03-07 | Disposition: A | Discharge status: Home / Self Care | Payer: Medicare Other | Attending: Internal Medicine | Admitting: Internal Medicine

## 2022-03-07 DIAGNOSIS — I10 Essential (primary) hypertension: Secondary | ICD-10-CM | POA: Diagnosis not present

## 2022-03-07 DIAGNOSIS — I2699 Other pulmonary embolism without acute cor pulmonale: Secondary | ICD-10-CM

## 2022-03-07 DIAGNOSIS — J439 Emphysema, unspecified: Secondary | ICD-10-CM | POA: Diagnosis not present

## 2022-03-07 DIAGNOSIS — I2602 Saddle embolus of pulmonary artery with acute cor pulmonale: Secondary | ICD-10-CM | POA: Diagnosis not present

## 2022-03-07 DIAGNOSIS — J9621 Acute and chronic respiratory failure with hypoxia: Secondary | ICD-10-CM | POA: Diagnosis not present

## 2022-03-07 DIAGNOSIS — I517 Cardiomegaly: Secondary | ICD-10-CM | POA: Diagnosis not present

## 2022-03-07 DIAGNOSIS — R590 Localized enlarged lymph nodes: Secondary | ICD-10-CM | POA: Diagnosis present

## 2022-03-07 HISTORY — PX: PULMONARY THROMBECTOMY: CATH118295

## 2022-03-07 LAB — BASIC METABOLIC PANEL
Anion gap: 6 (ref 5–15)
BUN: 24 mg/dL — ABNORMAL HIGH (ref 6–20)
CO2: 23 mmol/L (ref 22–32)
Calcium: 8.4 mg/dL — ABNORMAL LOW (ref 8.9–10.3)
Chloride: 109 mmol/L (ref 98–111)
Creatinine, Ser: 1.14 mg/dL — ABNORMAL HIGH (ref 0.44–1.00)
GFR, Estimated: 55 mL/min — ABNORMAL LOW (ref >60)
Glucose, Bld: 93 mg/dL (ref 70–99)
Potassium: 4.1 mmol/L (ref 3.5–5.1)
Sodium: 138 mmol/L (ref 135–145)

## 2022-03-07 LAB — ECHOCARDIOGRAM COMPLETE
AR max vel: 2.32 cm2
AV Area VTI: 2.37 cm2
AV Area mean vel: 2.02 cm2
AV Mean grad: 2 mmHg
AV Peak grad: 2.8 mmHg
Ao pk vel: 0.83 m/s
Area-P 1/2: 8.92 cm2
Height: 65 in
S' Lateral: 2 cm
Single Plane A4C EF: 38.2 %
Weight: 2278.67 [oz_av]

## 2022-03-07 LAB — CBC
HCT: 50.7 % — ABNORMAL HIGH (ref 36.0–46.0)
Hemoglobin: 16.7 g/dL — ABNORMAL HIGH (ref 12.0–15.0)
MCH: 31.3 pg (ref 26.0–34.0)
MCHC: 32.9 g/dL (ref 30.0–36.0)
MCV: 95.1 fL (ref 80.0–100.0)
Platelets: 167 K/uL (ref 150–400)
RBC: 5.33 MIL/uL — ABNORMAL HIGH (ref 3.87–5.11)
RDW: 17.6 % — ABNORMAL HIGH (ref 11.5–15.5)
WBC: 6.2 K/uL (ref 4.0–10.5)
nRBC: 0.8 % — ABNORMAL HIGH (ref 0.0–0.2)

## 2022-03-07 LAB — HEPARIN LEVEL (UNFRACTIONATED)
Heparin Unfractionated: 0.38 IU/mL (ref 0.30–0.70)
Heparin Unfractionated: 0.66 [IU]/mL (ref 0.30–0.70)
Heparin Unfractionated: 0.93 IU/mL — ABNORMAL HIGH (ref 0.30–0.70)

## 2022-03-07 LAB — HIV ANTIBODY (ROUTINE TESTING W REFLEX): HIV Screen 4th Generation wRfx: NONREACTIVE

## 2022-03-07 SURGERY — PULMONARY THROMBECTOMY
Anesthesia: Moderate Sedation

## 2022-03-07 MED ORDER — CEFAZOLIN SODIUM-DEXTROSE 2-4 GM/100ML-% IV SOLN: 2.0000 g | INTRAVENOUS | Status: DC

## 2022-03-07 MED ORDER — FENTANYL CITRATE (PF) 100 MCG/2ML IJ SOLN
INTRAMUSCULAR | Status: DC | PRN
  Administered 2022-03-07: 50 ug via INTRAVENOUS

## 2022-03-07 MED ORDER — MIDAZOLAM HCL 5 MG/5ML IJ SOLN
INTRAMUSCULAR | Status: AC
  Filled 2022-03-07: qty 5

## 2022-03-07 MED ORDER — ALTEPLASE 2 MG IJ SOLR
INTRAMUSCULAR | Status: AC
  Filled 2022-03-07: qty 8

## 2022-03-07 MED ORDER — HYDROMORPHONE HCL 1 MG/ML IJ SOLN: 1.0000 mg | Freq: Once | INTRAMUSCULAR | Status: DC | PRN

## 2022-03-07 MED ORDER — CEFAZOLIN SODIUM-DEXTROSE 1-4 GM/50ML-% IV SOLN
INTRAVENOUS | Status: DC | PRN
  Administered 2022-03-07: 2 g via INTRAVENOUS

## 2022-03-07 MED ORDER — SODIUM CHLORIDE 0.9 % IV SOLN: INTRAVENOUS | Status: DC

## 2022-03-07 MED ORDER — ALTEPLASE 1 MG/ML SYRINGE FOR VASCULAR PROCEDURE
INTRAMUSCULAR | Status: DC | PRN
  Administered 2022-03-07 (×2): 4 mg via INTRA_ARTERIAL

## 2022-03-07 MED ORDER — METHYLPREDNISOLONE SODIUM SUCC 125 MG IJ SOLR: 125.0000 mg | Freq: Once | INTRAMUSCULAR | Status: DC | PRN

## 2022-03-07 MED ORDER — IOHEXOL 300 MG/ML  SOLN
60.0000 mL | Freq: Once | INTRAMUSCULAR | Status: AC | PRN
  Administered 2022-03-07: 60 mL via INTRAVENOUS

## 2022-03-07 MED ORDER — FENTANYL CITRATE (PF) 100 MCG/2ML IJ SOLN
INTRAMUSCULAR | Status: AC
  Filled 2022-03-07: qty 2

## 2022-03-07 MED ORDER — MIDAZOLAM HCL 2 MG/ML PO SYRP: 8.0000 mg | ORAL_SOLUTION | Freq: Once | ORAL | Status: DC | PRN

## 2022-03-07 MED ORDER — BENZONATATE 100 MG PO CAPS
200.0000 mg | ORAL_CAPSULE | Freq: Three times a day (TID) | ORAL | Status: AC
  Administered 2022-03-07 (×3): 200 mg via ORAL
  Filled 2022-03-07 (×3): qty 2

## 2022-03-07 MED ORDER — ONDANSETRON HCL 4 MG/2ML IJ SOLN: 4.0000 mg | Freq: Four times a day (QID) | INTRAMUSCULAR | Status: DC | PRN

## 2022-03-07 MED ORDER — FAMOTIDINE 20 MG PO TABS: 40.0000 mg | ORAL_TABLET | Freq: Once | ORAL | Status: DC | PRN

## 2022-03-07 MED ORDER — HEPARIN SODIUM (PORCINE) 1000 UNIT/ML IJ SOLN
INTRAMUSCULAR | Status: AC
  Filled 2022-03-07: qty 10

## 2022-03-07 MED ORDER — IODIXANOL 320 MG/ML IV SOLN
INTRAVENOUS | Status: DC | PRN
  Administered 2022-03-07: 75 mL

## 2022-03-07 MED ORDER — MIDAZOLAM HCL 2 MG/2ML IJ SOLN
INTRAMUSCULAR | Status: DC | PRN
  Administered 2022-03-07: 1 mg via INTRAVENOUS

## 2022-03-07 MED ORDER — DIPHENHYDRAMINE HCL 50 MG/ML IJ SOLN: 50.0000 mg | Freq: Once | INTRAMUSCULAR | Status: DC | PRN

## 2022-03-07 SURGICAL SUPPLY — 15 items
CANISTER PENUMBRA ENGINE (MISCELLANEOUS) ×1 IMPLANT
CATH ANGIO 5F PIGTAIL 100CM (CATHETERS) ×1 IMPLANT
CATH INDIGO 12XTORQ 100 (CATHETERS) ×1 IMPLANT
CATH INDIGO SEP 12 (CATHETERS) ×1 IMPLANT
CATH INFINITI JR4 5F (CATHETERS) ×1 IMPLANT
CLOSURE PERCLOSE PROSTYLE (VASCULAR PRODUCTS) ×2 IMPLANT
COVER PROBE U/S 5X48 (MISCELLANEOUS) ×1 IMPLANT
GLIDEWIRE ADV .035X260CM (WIRE) ×1 IMPLANT
PACK ANGIOGRAPHY (CUSTOM PROCEDURE TRAY) ×2 IMPLANT
SHEATH BRITE TIP 6FRX11 (SHEATH) ×1 IMPLANT
SHEATH PINNACLE 11FRX10 (SHEATH) ×1 IMPLANT
SYR MEDRAD MARK 7 150ML (SYRINGE) ×1 IMPLANT
TUBING CONTRAST HIGH PRESS 72 (TUBING) ×1 IMPLANT
WIRE AMPLATZ SSTIFF .035X260CM (WIRE) ×1 IMPLANT
WIRE GUIDERIGHT .035X150 (WIRE) ×1 IMPLANT

## 2022-03-07 NOTE — Progress Notes (Signed)
[place] for initiation and monitoring of heparin infusion Indication: pulmonary embolus  Allergies  Allergen Reactions   Hydroxychloroquine     headaches   Meclizine     headaches    Patient Measurements: Height: _0  (165.1 cm) Weight: 63.5 kg (140 lb) IBW/kg (Calculated) : 57 Heparin Dosing Weight: 63.5 kg  Vital Signs: Temp: 98.3 F (36.8 C) (08/11 2000) Temp Source: Oral (08/11 2000) BP: 114/83 (08/11 2100) Pulse Rate: 94 (08/11 2130)  Labs: Recent Labs    03/06/22 1218 03/06/22 1340 03/06/22 1341 03/06/22 1756 03/06/22 2352 03/07/22 0558 03/07/22 2132  HGB  --  18.3*  --   --   --  16.7*  --   HCT  --  55.6*  --   --   --  50.7*  --   PLT  --  168  --   --   --  167  --   APTT  --   --   --  >200*  --   --   --   LABPROT  --   --   --  19.6*  --   --   --   INR  --   --   --  1.7*  --   --   --   HEPARINUNFRC  --   --   --   --  0.66 0.93* 0.38  CREATININE 1.24*  --   --   --   --  1.14*  --   TROPONINIHS  --   --  40*  --   --   --   --      Estimated Creatinine Clearance: 47.2 mL/min (A) (by C-G formula based on SCr of 1.14 mg/dL (H)).   Medical History: Past Medical History:  Diagnosis Date   COPD (chronic obstructive pulmonary disease) ([name])    Discoid lupus    Eczema    Esophageal dysmotility    Genital herpes    GERD (gastroesophageal reflux disease)    HSV (herpes simplex virus) infection    Hypertension    Lumbago    Lumbar disc disease    Peripheral neuropathy    Pulmonary fibrosis ([name])    Pulmonary hypertension ([name])    Raynaud's disease    Sclerodactyly    Scleroderma ([name])    Spinal stenosis     Assessment:  61 y.o. female w/ PMH of HTN, SLE, scleroderma, pulmonary fibrosis, pulmonary hypertension, and esophageal dysmotility. CT angio reveals Nonocclusive saddle pulmonary embolus extending to the left lower lobe segmental and subsegmental levels as well as into the proximal right main  pulmonary artery. A review of medical records reveals no chronic anticoagulation  Goal of Therapy:  Heparin level 0.3-0.7 units/ml Monitor platelets by anticoagulation protocol: Yes  8/10 2352 HL 0.66, therapeutic x 1 8/11 0558 HL 0.93, supratherapeutic 8/11 2132 HL 0.38, therapeutic x 1   Plan:  Continue heparin infusion at 950 units/hr Will recheck HL w/ AM labs to confirm CBC daily while on heparin  Renda Rolls, PharmD, [facility] 03/07/2022 10:20 PM

## 2022-03-07 NOTE — Progress Notes (Signed)
*  PRELIMINARY RESULTS* Echocardiogram 2D Echocardiogram has been performed.  Sherrie Sport 03/07/2022, 8:54 AM

## 2022-03-07 NOTE — Assessment & Plan Note (Signed)
2.3 cm right hilar lymph node, possible inflammatory, noted on CT chest 03/07/22   Outpatient follow-up CT with IV contrast 2 to 3 months.

## 2022-03-07 NOTE — Progress Notes (Addendum)
PROGRESS NOTE    CERINITY ZYNDA   GMW:[id number] DOB: [birth year]  DOA: 03/06/2022 Date of Service: 03/07/22 PCP: Dion Body, MD     Brief Narrative / Hospital Course:  Ms. Hanalei Glace is a 61 year old female with hypertension, neuropathy, GERD, pulmonary hypertension, scleroderma, COPD, hepatosteatosis, mixed connective tissue disease, long-term use of immunosuppressant medication, pulmonary fibrosis, who presents to the emergency department 03/06/2022 for chief concerns of poor appetite. 08/10: Initial vitals heart rate of 103, blood pressure 123/103, SpO2 of 72% on room air with improvement to 95% on 5 L nasal cannula. BNP was elevated at 1738.4, high sensitive troponin was 40, creatinine 1.24.  No concerns on CT abdomen/pelvis.  CTA chest (+)Nonocclusive saddle pulmonary embolus extending to the left lower lobe segmental and subsegmental levels as well as into the proximal right main pulmonary artery. Associated right heart strain. No pulmonary infarction.  Severe emphysema, multiple pulmonary nodules to follow-up outpatient. Heparin GTT per pharmacy was initiated.  Patient was admitted to hospitalist service with vascular consult for saddle PE. EDP had already consulted vascular who recommended heparin bolus and patient to be taken to the OR for thrombectomy in the a.m.  08/11: thrombectomy planned. I was contacted by radiologist Dr. Denna Haggard this morning and due to abnormal appearance of presumed clot and no evidence for DVT as well as significantly dilated RA, radiology is recommending CT chest with contrast to evaluate for potential mediastinal mass as opposed to pulmonary embolus, prior to taking her for thrombectomy.  Confirmed PE.  Also noted 2.3 cm right hilar lymph node, possible inflammatory, recommending follow-up CT with IV contrast 2 to 3 months. Echocardiogram --> LVEF 65 to 70%, normal function, normal diastolic parameters.  Right ventricular systolic function mildly  reduced, moderately increased RV wall thickness, severely elevated pulmonary artery systolic pressure, right atrial size severely dilated.   Consultants:  Vascular surgery  Procedures: Plan for thrombectomy of saddle pulmonary embolus 03/07/2022    Subjective: Patient reports no concerns this morning, SOB has improved but not altogether resolved.     ASSESSMENT & PLAN:   Principal Problem:   Pulmonary emboli ([name]) Active Problems:   Essential hypertension   Pulmonary fibrosis ([name])   Pulmonary hypertension ([name])   Mixed connective tissue disease ([name])   GERD (gastroesophageal reflux disease)   Acute on chronic respiratory failure with hypoxia ([name])   Emphysema/COPD ([name])   Lymphadenopathy, mediastinal   Pulmonary emboli ([name]) stepdown, inpatient heparin GTT per pharmacy Echocardiogram --> LVEF 65 to 70%, normal function, normal diastolic parameters.  Right ventricular systolic function mildly reduced, moderately increased RV wall thickness, severely elevated pulmonary artery systolic pressure, right atrial size severely dilated. DVT negative Vascular surgery following, managing heparin gtt,  plans for thrombectomy in 03/07/2022 a.m.  Acute on chronic respiratory failure with hypoxia ([name]) Secondary to pulmonary emboli Patient at baseline wears oxygen supplementation as needed for emphysema  Continue oxygen supplementation to maintain SpO2 greater than 92%  Mixed connective tissue disease ([name]) status post Plaquenil, prednisone, Imuran Currently on mycophenolate 500 mg p.o. twice daily, resumed  Pulmonary fibrosis ([place]) Pulmonary hypertension Continue outpatient follow-up with pulmonologist Resumed home tadalafil 20 mg daily  Lymphadenopathy, mediastinal 2.3 cm right hilar lymph node, possible inflammatory, noted on CT chest 03/07/22  Outpatient follow-up CT with IV contrast 2 to 3 months  Emphysema/COPD [facility]) Extensive lung disease, see above for pulmonary  fibrosis No COPD exacerbation    DVT prophylaxis: Currently on heparin drip, pending vascular procedure Code Status: Full code  Family Communication: none at this time  Disposition Plan / TOC needs: Anticipate discharge back to previous home environment, may need PT/OT evaluation Barriers to discharge / significant pending items: Thrombectomy planned for today, anticipate discharge in the next few days pending vascular surgery clearance             Objective: Vitals:   03/07/22 0800 03/07/22 0900 03/07/22 0920 03/07/22 1216  BP: (!) 118/100   (!) 129/96  Pulse: 95 63  (!) 104  Resp: (!) 25 (!) 24 (!) 27 (!) 30  Temp: 97.9 F (36.6 C)   97.9 F (36.6 C)  TempSrc: Oral   Oral  SpO2: 92% 100% 100% 95%  Weight:    63.5 kg  Height:    _0  (1.651 m)    Intake/Output Summary (Last 24 hours) at 03/07/2022 1438 Last data filed at 03/07/2022 0700 Gross per 24 hour  Intake 438.67 ml  Output 350 ml  Net 88.67 ml   Filed Weights   03/06/22 1234 03/06/22 1700 03/07/22 1216  Weight: 63.5 kg 64.6 kg 63.5 kg    Examination:  Constitutional:  VS as above General Appearance: alert, well-developed, well-nourished, NAD Neck: No masses, trachea midline Respiratory: Normal respiratory effort No wheeze No rales Cardiovascular: S1/S2 normal No lower extremity edema Gastrointestinal: No tenderness Musculoskeletal:  No clubbing/cyanosis of digits Symmetrical movement in all extremities Neurological: No cranial nerve deficit on limited exam Alert Psychiatric: Normal judgment/insight Normal mood and affect       Scheduled Medications:   [MAR Hold] amLODipine  10 mg Oral Daily   [MAR Hold] benzonatate  200 mg Oral TID   [MAR Hold] Chlorhexidine Gluconate Cloth  6 each Topical Q0600   [MAR Hold] gabapentin  600 mg Oral TID   [MAR Hold] mycophenolate  500 mg Oral BID   [MAR Hold] pantoprazole  40 mg Oral Daily   [MAR Hold] tadalafil (PAH)  20 mg Oral Daily     Continuous Infusions:  sodium chloride 75 mL/hr at 03/07/22 0946    ceFAZolin (ANCEF) IV     heparin Stopped (03/07/22 1252)    PRN Medications:  [MAR Hold] acetaminophen **OR** [MAR Hold] acetaminophen, diphenhydrAMINE, famotidine, fentaNYL, [MAR Hold] fluticasone, [MAR Hold] hydrALAZINE, [MAR Hold]  HYDROmorphone (DILAUDID) injection, methylPREDNISolone (SOLU-MEDROL) injection, midazolam, midazolam, [MAR Hold] ondansetron **OR** [MAR Hold] ondansetron (ZOFRAN) IV, [MAR Hold] senna-docusate  Antimicrobials:  Anti-infectives (From admission, onward)    Start     Dose/Rate Route Frequency Ordered Stop   03/07/22 0804  ceFAZolin (ANCEF) IVPB 2g/100 mL premix        2 g 200 mL/hr over 30 Minutes Intravenous 30 min pre-op 03/07/22 0804         Data Reviewed: I have personally reviewed following labs and imaging studies  CBC: Recent Labs  Lab 03/06/22 1340 03/07/22 0558  WBC 5.4 6.2  NEUTROABS 3.9  --   HGB 18.3* 16.7*  HCT 55.6* 50.7*  MCV 95.5 95.1  PLT 168 683   Basic Metabolic Panel: Recent Labs  Lab 03/06/22 1218 03/07/22 0558  NA 136 138  K 5.0 4.1  CL 104 109  CO2 22 23  GLUCOSE 111* 93  BUN 24* 24*  CREATININE 1.24* 1.14*  CALCIUM 8.8* 8.4*   GFR: Estimated Creatinine Clearance: 47.2 mL/min (A) (by C-G formula based on SCr of 1.14 mg/dL (H)). Liver Function Tests: Recent Labs  Lab 03/06/22 1218  AST 40  ALT 14  ALKPHOS 424*  BILITOT 2.7*  PROT  7.9  ALBUMIN 3.2*   Recent Labs  Lab 03/06/22 1341  LIPASE 26   No results for input(s): "AMMONIA" in the last 168 hours. Coagulation Profile: Recent Labs  Lab 03/06/22 1756  INR 1.7*   Cardiac Enzymes: No results for input(s): "CKTOTAL", "CKMB", "CKMBINDEX", "TROPONINI" in the last 168 hours. BNP (last 3 results) No results for input(s): "PROBNP" in the last 8760 hours. HbA1C: No results for input(s): "HGBA1C" in the last 72 hours. CBG: No results for input(s): "GLUCAP" in the last 168  hours. Lipid Profile: No results for input(s): "CHOL", "HDL", "LDLCALC", "TRIG", "CHOLHDL", "LDLDIRECT" in the last 72 hours. Thyroid Function Tests: No results for input(s): "TSH", "T4TOTAL", "FREET4", "T3FREE", "THYROIDAB" in the last 72 hours. Anemia Panel: No results for input(s): "VITAMINB12", "FOLATE", "FERRITIN", "TIBC", "IRON", "RETICCTPCT" in the last 72 hours. Urine analysis: No results found for: "COLORURINE", "APPEARANCEUR", "LABSPEC", "PHURINE", "GLUCOSEU", "HGBUR", "BILIRUBINUR", "KETONESUR", "PROTEINUR", "UROBILINOGEN", "NITRITE", "LEUKOCYTESUR" Sepsis Labs: _0 (procalcitonin:4,lacticidven:4)  Recent Results (from the past 240 hour(s))  MRSA Next Gen by PCR, Nasal     Status: None   Collection Time: 03/06/22  5:07 PM   Specimen: Nasal Mucosa; Nasal Swab  Result Value Ref Range Status   MRSA by PCR Next Gen NOT DETECTED NOT DETECTED Final    Comment: (NOTE) The GeneXpert MRSA Assay (FDA approved for NASAL specimens only), is one component of a comprehensive MRSA colonization surveillance program. It is not intended to diagnose MRSA infection nor to guide or monitor treatment for MRSA infections. Test performance is not FDA approved in patients less than 38 years old. Performed at [facility], [address] [place], [place] [address]          Radiology Studies: US Venous Img Lower Bilateral (DVT)  Result Date: 03/07/2022 CLINICAL DATA:  Patient with pulmonary embolism.  Evaluate for DVT. EXAM: BILATERAL LOWER EXTREMITY VENOUS DOPPLER ULTRASOUND TECHNIQUE: Gray-scale sonography with graded compression, as well as color Doppler and duplex ultrasound were performed to evaluate the lower extremity deep venous systems from the level of the common femoral vein and including the common femoral, femoral, profunda femoral, popliteal and calf veins including the posterior tibial, peroneal and gastrocnemius veins when visible. The superficial great saphenous  vein was also interrogated. Spectral Doppler was utilized to evaluate flow at rest and with distal augmentation maneuvers in the common femoral, femoral and popliteal veins. COMPARISON:  None Available. FINDINGS: RIGHT LOWER EXTREMITY Common Femoral Vein: No evidence of thrombus. Normal compressibility, respiratory phasicity and response to augmentation. Saphenofemoral Junction: No evidence of thrombus. Normal compressibility and flow on color Doppler imaging. Profunda Femoral Vein: No evidence of thrombus. Normal compressibility and flow on color Doppler imaging. Femoral Vein: No evidence of thrombus. Normal compressibility, respiratory phasicity and response to augmentation. Popliteal Vein: No evidence of thrombus. Normal compressibility, respiratory phasicity and response to augmentation. Calf Veins: No evidence of thrombus. Normal compressibility and flow on color Doppler imaging. Other Findings:  None. LEFT LOWER EXTREMITY Common Femoral Vein: No evidence of thrombus. Normal compressibility, respiratory phasicity and response to augmentation. Saphenofemoral Junction: No evidence of thrombus. Normal compressibility and flow on color Doppler imaging. Profunda Femoral Vein: No evidence of thrombus. Normal compressibility and flow on color Doppler imaging. Femoral Vein: No evidence of thrombus. Normal compressibility, respiratory phasicity and response to augmentation. Popliteal Vein: No evidence of thrombus. Normal compressibility, respiratory phasicity and response to augmentation. Calf Veins: No evidence of thrombus. Normal compressibility and flow on color Doppler imaging. Other Findings:  None.  IMPRESSION: No evidence of deep venous thrombosis in either lower extremity. Electronically Signed   By: Markus Daft M.D.   On: 03/07/2022 07:29            LOS: 1 day      Emeterio Reeve, DO [facility] 03/07/2022, 2:38 PM   Staff may message me via secure chat in [place]  but this may not  receive immediate response,  please page for urgent matters!  If 7PM-7AM, please contact night-coverage [email or web address]  Dictation software was used to generate the above note. Typos may occur and escape review, as with typed/written notes. Please contact Dr Sheppard Coil directly for clarity if needed.

## 2022-03-07 NOTE — Progress Notes (Signed)
Patient is c/o burning in the nose, oxygen is at 5 LPM via [place]. O2 saturations between 87-90%. Patient switched to [place] at 6 LPM via . Patient verbalized improvement before leaving room. O2 saturations improved.

## 2022-03-07 NOTE — Op Note (Signed)
[facility]  Percutaneous Study/Intervention Procedural Note   Date of Surgery: 03/07/2022,3:22 PM  Surgeon: Leotis Pain  Pre-operative Diagnosis: Symptomatic bilateral pulmonary emboli  Post-operative diagnosis:  Same  Procedure(s) Performed:  1.  Contrast injection right heart  2.  Thrombolysis with total of 8 mg of tPA, 4 mg in each of the main pulmonary arteries  3.  Mechanical thrombectomy to the left lower lobe pulmonary artery and the right lower and middle lobe pulmonary arteries with the penumbra CAT 12 device  4.  Selective catheter placement right lower lobe, middle lobe, and upper lobe pulmonary arteries  5.  Selective catheter placement left lower lobe and upper lobe pulmonary arteries    Anesthesia: Conscious sedation was administered under my direct supervision by the interventional radiology RN. IV Versed plus fentanyl were utilized. Continuous ECG, pulse oximetry and blood pressure was monitored throughout the entire procedure.  Versed and fentanyl were administered intravenously.  Conscious sedation was administered for a total of 46 minutes using 1 of Versed and 50 mcg of Fentanyl.  EBL: 350 cc  Sheath: 11 French right femoral vein  Contrast: 75 cc   Fluoroscopy Time: 9.8 minutes  Indications:  Patient presents with pulmonary emboli. The patient is symptomatic with hypoxemia and dyspnea on exertion.  There is evidence of right heart strain on the CT angiogram. The patient is otherwise a good candidate for intervention and even the long-term benefits pulmonary angiography with thrombolysis is offered. The risks and benefits are reviewed long-term benefits are discussed. All questions are answered patient agrees to proceed.  Procedure:  Tammy Pettijohn Richmondis a 61 y.o. female who was identified and appropriate procedural time out was performed.  The patient was then placed supine on the table and prepped and draped in the usual sterile fashion.   Ultrasound was used to evaluate the right common femoral vein.  It was patent, as it was echolucent and compressible.  A digital ultrasound image was acquired for the permanent record.  A Seldinger needle was used to access the right common femoral vein under direct ultrasound guidance.  A 0.035 J wire was advanced without resistance and a 5Fr sheath.  I then placed 2 ProGlide devices in the typical fashion over a J-wire and then upsized to an 11 [place] sheath.  The wire and pigtail catheter were then negotiated into the right atrium and bolus injection of contrast was utilized to demonstrate the right ventricle and the pulmonary artery outflow. The wire and catheter were then negotiated into the main pulmonary artery where hand injection of contrast was utilized to demonstrate the pulmonary arteries and confirm the locations of the pulmonary emboli.  A JR4 catheter and advantage wire were used to navigate through the markedly dilated right heart and pulmonary artery.  I first cannulated the right side and sequentially cannulated the right middle lobe, upper lobe, and lower lobe pulmonary arteries perform selective imaging.  Thrombus burden on the right was small to medium enlargement in the middle and lower lobes.  I then used the JR4 catheter and advantage wire to go over to the left side selectively cannulate left upper lobe and then left lower lobe.  The left upper lobe did not demonstrate any obvious thrombus in the left lower lobe had a moderate amount of thrombus burden.  TPA was reconstituted and delivered onto the table. A total of 8 milligrams of TPA was utilized.  4 mg was administered on the left side and 4 mg was  administered on the right side. This was then allowed to dwell.  The Penumbra Cat 12 catheter was then advanced up into the pulmonary vasculature. The left lung was addressed first. Catheter was negotiated into the left lower lobe pulmonary artery and mechanical thrombectomy was  performed. Passes were made with both the Penumbra catheter itself as well as introducing the separator. Follow-up imaging was then performed.  Marked improvement with no significant thrombus burden seen in the left lower lobe thrombectomy.  The Penumbra Cat 12 catheter was then negotiated to the opposite side. The right lung was then addressed. Catheter was negotiated into the right lower lobe and mechanical thrombectomy was performed.  The separator was used.  Follow-up imaging demonstrated a good result and therefore the catheter was renegotiated into the right middle lobe pulmonary artery and again mechanical thrombectomy was performed. Passes were made with both the Penumbra catheter itself as well as introducing the separator. Follow-up imaging was then performed.  Improvement was seen and we elected to terminate the procedure.  After review these images wires were reintroduced and the catheters removed.  The Pro-glide devices were secured and hemostasis was achieved.  Then, the sheath is then pulled and pressures held. A safeguard is placed.    Findings:   Right heart imaging:  Right atrium and right ventricle and the pulmonary outflow tract appears markedly dilated  Right lung: Small to medium amount of thrombus burden in the right middle and lower lobe pulmonary arteries originating from the distal main pulmonary artery  Left lung: Significant thrombus burden of the left lower lobe without significant spread    Disposition: Patient was taken to the recovery room in stable condition having tolerated the procedure well.  Leotis Pain 03/07/2022,3:22 PM

## 2022-03-07 NOTE — Interval H&P Note (Signed)
History and Physical Interval Note:  03/07/2022 12:19 PM  Tammy Barrett  has presented today for surgery, with the diagnosis of Pulmonary embolism.  The various methods of treatment have been discussed with the patient and family. After consideration of risks, benefits and other options for treatment, the patient has consented to  Procedure(s): PULMONARY THROMBECTOMY (N/A) as a surgical intervention.  The patient's history has been reviewed, patient examined, no change in status, stable for surgery.  I have reviewed the patient's chart and labs.  Questions were answered to the patient's satisfaction.     Leotis Pain

## 2022-03-07 NOTE — Progress Notes (Signed)
[place] for initiation and monitoring of heparin infusion Indication: pulmonary embolus  Allergies  Allergen Reactions   Hydroxychloroquine     headaches   Meclizine     headaches    Patient Measurements: Height: _0  (165.1 cm) Weight: 64.6 kg (142 lb 6.7 oz) IBW/kg (Calculated) : 57 Heparin Dosing Weight: 63.5 kg  Vital Signs: Temp: 98.8 F (37.1 C) (08/11 0100) Temp Source: Oral (08/11 0100) BP: 129/98 (08/11 0600) Pulse Rate: 105 (08/11 0600)  Labs: Recent Labs    03/06/22 1218 03/06/22 1340 03/06/22 1341 03/06/22 1756 03/06/22 2352 03/07/22 0558  HGB  --  18.3*  --   --   --  16.7*  HCT  --  55.6*  --   --   --  50.7*  PLT  --  168  --   --   --  167  APTT  --   --   --  >200*  --   --   LABPROT  --   --   --  19.6*  --   --   INR  --   --   --  1.7*  --   --   HEPARINUNFRC  --   --   --   --  0.66 0.93*  CREATININE 1.24*  --   --   --   --  1.14*  TROPONINIHS  --   --  40*  --   --   --      Estimated Creatinine Clearance: 47.2 mL/min (A) (by C-G formula based on SCr of 1.14 mg/dL (H)).   Medical History: Past Medical History:  Diagnosis Date   COPD (chronic obstructive pulmonary disease) ([name])    Discoid lupus    Eczema    Esophageal dysmotility    Genital herpes    GERD (gastroesophageal reflux disease)    HSV (herpes simplex virus) infection    Hypertension    Lumbago    Lumbar disc disease    Peripheral neuropathy    Pulmonary fibrosis ([name])    Pulmonary hypertension ([name])    Raynaud's disease    Sclerodactyly    Scleroderma ([name])    Spinal stenosis     Assessment:  61 y.o. female w/ PMH of HTN, SLE, scleroderma, pulmonary fibrosis, pulmonary hypertension, and esophageal dysmotility. CT angio reveals Nonocclusive saddle pulmonary embolus extending to the left lower lobe segmental and subsegmental levels as well as into the proximal right main pulmonary artery. A review of medical records reveals  no chronic anticoagulation  Goal of Therapy:  Heparin level 0.3-0.7 units/ml Monitor platelets by anticoagulation protocol: Yes  8/10 2352 HL 0.66, therapeutic x 1 8/11 0558 HL 0.93, supratherapeutic   Plan:  Decrease heparin infusion to 950 units/hr Will recheck HL in 6 hr after rate change CBC daily while on heparin  Renda Rolls, PharmD, [facility] 03/07/2022 6:42 AM

## 2022-03-07 NOTE — Progress Notes (Addendum)
[place] for initiation and monitoring of heparin infusion Indication: pulmonary embolus  Allergies  Allergen Reactions   Hydroxychloroquine     headaches   Meclizine     headaches    Patient Measurements: Height: _0  (165.1 cm) Weight: 64.6 kg (142 lb 6.7 oz) IBW/kg (Calculated) : 57 Heparin Dosing Weight: 63.5 kg  Vital Signs: Temp: 98.7 F (37.1 C) (08/10 2000) Temp Source: Oral (08/10 2000) BP: 125/99 (08/11 0000) Pulse Rate: 93 (08/11 0030)  Labs: Recent Labs    03/06/22 1218 03/06/22 1340 03/06/22 1341 03/06/22 1756 03/06/22 2352  HGB  --  18.3*  --   --   --   HCT  --  55.6*  --   --   --   PLT  --  168  --   --   --   APTT  --   --   --  >200*  --   LABPROT  --   --   --  19.6*  --   INR  --   --   --  1.7*  --   HEPARINUNFRC  --   --   --   --  0.66  CREATININE 1.24*  --   --   --   --   TROPONINIHS  --   --  40*  --   --      Estimated Creatinine Clearance: 43.4 mL/min (A) (by C-G formula based on SCr of 1.24 mg/dL (H)).   Medical History: Past Medical History:  Diagnosis Date   COPD (chronic obstructive pulmonary disease) ([name])    Discoid lupus    Eczema    Esophageal dysmotility    Genital herpes    GERD (gastroesophageal reflux disease)    HSV (herpes simplex virus) infection    Hypertension    Lumbago    Lumbar disc disease    Peripheral neuropathy    Pulmonary fibrosis ([name])    Pulmonary hypertension ([name])    Raynaud's disease    Sclerodactyly    Scleroderma ([name])    Spinal stenosis     Assessment:  61 y.o. female w/ PMH of HTN, SLE, scleroderma, pulmonary fibrosis, pulmonary hypertension, and esophageal dysmotility. CT angio reveals Nonocclusive saddle pulmonary embolus extending to the left lower lobe segmental and subsegmental levels as well as into the proximal right main pulmonary artery. A review of medical records reveals no chronic anticoagulation  Goal of Therapy:  Heparin level  0.3-0.7 units/ml Monitor platelets by anticoagulation protocol: Yes  8/10 2352 HL 0.66, therapeutic x 1   Plan:  Continue heparin infusion at 1100 units/hr Will recheck HL w AM labs to confirm  CBC daily while on heparin  Renda Rolls, PharmD, [facility] 03/07/2022 1:06 AM

## 2022-03-07 NOTE — Assessment & Plan Note (Signed)
Extensive lung disease, see above for pulmonary fibrosis On IV Solu-Medrol and nebulizer along with inhalers No home inhalers Not compliant w/ home O2  Pulmonology consulted 03/11/22 and following  DuoNeb prn

## 2022-03-08 DIAGNOSIS — J9621 Acute and chronic respiratory failure with hypoxia: Secondary | ICD-10-CM | POA: Diagnosis not present

## 2022-03-08 DIAGNOSIS — I2602 Saddle embolus of pulmonary artery with acute cor pulmonale: Secondary | ICD-10-CM | POA: Diagnosis not present

## 2022-03-08 DIAGNOSIS — I10 Essential (primary) hypertension: Secondary | ICD-10-CM | POA: Diagnosis not present

## 2022-03-08 DIAGNOSIS — J439 Emphysema, unspecified: Secondary | ICD-10-CM | POA: Diagnosis not present

## 2022-03-08 LAB — CBC
HCT: 45.5 % (ref 36.0–46.0)
Hemoglobin: 14.4 g/dL (ref 12.0–15.0)
MCH: 31.1 pg (ref 26.0–34.0)
MCHC: 31.6 g/dL (ref 30.0–36.0)
MCV: 98.3 fL (ref 80.0–100.0)
Platelets: 145 10*3/uL — ABNORMAL LOW (ref 150–400)
RBC: 4.63 MIL/uL (ref 3.87–5.11)
RDW: 17.4 % — ABNORMAL HIGH (ref 11.5–15.5)
WBC: 6.3 10*3/uL (ref 4.0–10.5)
nRBC: 0.6 % — ABNORMAL HIGH (ref 0.0–0.2)

## 2022-03-08 LAB — GLUCOSE, CAPILLARY
Glucose-Capillary: 135 mg/dL — ABNORMAL HIGH (ref 70–99)
Glucose-Capillary: 126 mg/dL — ABNORMAL HIGH (ref 70–99)

## 2022-03-08 LAB — BASIC METABOLIC PANEL
Anion gap: 5 (ref 5–15)
BUN: 25 mg/dL — ABNORMAL HIGH (ref 6–20)
CO2: 22 mmol/L (ref 22–32)
Calcium: 7.8 mg/dL — ABNORMAL LOW (ref 8.9–10.3)
Chloride: 109 mmol/L (ref 98–111)
Creatinine, Ser: 1.15 mg/dL — ABNORMAL HIGH (ref 0.44–1.00)
GFR, Estimated: 55 mL/min — ABNORMAL LOW (ref >60)
Glucose, Bld: 131 mg/dL — ABNORMAL HIGH (ref 70–99)
Potassium: 4.2 mmol/L (ref 3.5–5.1)
Sodium: 136 mmol/L (ref 135–145)

## 2022-03-08 LAB — HEPARIN LEVEL (UNFRACTIONATED): Heparin Unfractionated: 0.52 IU/mL (ref 0.30–0.70)

## 2022-03-08 NOTE — Progress Notes (Signed)
1 Day Post-Op   Subjective/Chief Complaint: Doing Ok. Denies shortness of breath or chest pain.   Objective: Vital signs in last 24 hours: Temp:  [97.9 F (36.6 C)-98.4 F (36.9 C)] 98 F (36.7 C) (08/12 0130) Pulse Rate:  [37-109] 85 (08/12 0700) Resp:  [9-30] 19 (08/12 0745) BP: (92-140)/(54-104) 108/87 (08/12 0700) SpO2:  [69 %-100 %] 94 % (08/12 0745) Weight:  [63.5 kg] 63.5 kg (08/11 1216) Last BM Date : 03/06/22  Intake/Output from previous day: 08/11 0701 - 08/12 0700 In: 604.3 [P.O.:240; I.V.:314.3; IV Piggyback:50] Out: 950 [Urine:600; Emesis/NG output:100; Blood:250] Intake/Output this shift: No intake/output data recorded.  General appearance: alert and no distress Resp: clear to auscultation bilaterally Cardio: regular rate and rhythm Extremities: RIGHT groin access site- C/D, soft  Lab Results:  Recent Labs    03/07/22 0558 03/08/22 0350  WBC 6.2 6.3  HGB 16.7* 14.4  HCT 50.7* 45.5  PLT 167 145*   BMET Recent Labs    03/07/22 0558 03/08/22 0350  NA 138 136  K 4.1 4.2  CL 109 109  CO2 23 22  GLUCOSE 93 131*  BUN 24* 25*  CREATININE 1.14* 1.15*  CALCIUM 8.4* 7.8*   PT/INR Recent Labs    03/06/22 1756  LABPROT 19.6*  INR 1.7*   ABG No results for input(s): "PHART", "HCO3" in the last 72 hours.  Invalid input(s): "PCO2", "PO2"  Studies/Results: PERIPHERAL VASCULAR CATHETERIZATION  Result Date: 03/07/2022 See surgical note for result.  ECHOCARDIOGRAM COMPLETE  Result Date: 03/07/2022    ECHOCARDIOGRAM REPORT   Patient Name:   Tammy Barrett Date of Exam: 03/07/2022 Medical Rec #:  [id number]        Height:       65.0 in Accession #:    [id number]       Weight:       142.4 lb Date of Birth:  01/15/1961        BSA:          1.712 m Patient Age:    61 years         BP:           118/100 mmHg Patient Gender: F                HR:           95 bpm. Exam Location:  [facility] Procedure: 2D Echo, Cardiac Doppler and Color Doppler Indications:      Pulmonary Embolus I26.09  History:         Patient has no prior history of Echocardiogram examinations.                  COPD and Pulmonary HTN; Risk Factors:Hypertension.  Sonographer:     Sherrie Sport Referring Phys:  [id number] AMY N COX Diagnosing Phys: Serafina Royals MD IMPRESSIONS  1. Left ventricular ejection fraction, by estimation, is 65 to 70%. The left ventricle has normal function. The left ventricle has no regional wall motion abnormalities. Left ventricular diastolic parameters were normal.  2. Right ventricular systolic function is mildly reduced. The right ventricular size is severely enlarged. Moderately increased right ventricular wall thickness. There is severely elevated pulmonary artery systolic pressure.  3. Right atrial size was severely dilated.  4. The mitral valve is normal in structure. Trivial mitral valve regurgitation.  5. Tricuspid valve regurgitation is severe.  6. The aortic valve is normal in structure. Aortic valve regurgitation is trivial. FINDINGS  Left Ventricle: Left ventricular ejection  fraction, by estimation, is 65 to 70%. The left ventricle has normal function. The left ventricle has no regional wall motion abnormalities. The left ventricular internal cavity size was small. There is no left ventricular hypertrophy. Left ventricular diastolic parameters were normal. Right Ventricle: The right ventricular size is severely enlarged. Moderately increased right ventricular wall thickness. Right ventricular systolic function is mildly reduced. There is severely elevated pulmonary artery systolic pressure. Left Atrium: Left atrial size was normal in size. Right Atrium: Right atrial size was severely dilated. Pericardium: There is no evidence of pericardial effusion. Mitral Valve: The mitral valve is normal in structure. Trivial mitral valve regurgitation. Tricuspid Valve: The tricuspid valve is normal in structure. Tricuspid valve regurgitation is severe. Aortic Valve: The aortic valve  is normal in structure. Aortic valve regurgitation is trivial. Aortic valve mean gradient measures 2.0 mmHg. Aortic valve peak gradient measures 2.8 mmHg. Aortic valve area, by VTI measures 2.37 cm. Pulmonic Valve: The pulmonic valve was normal in structure. Pulmonic valve regurgitation is mild. Aorta: The aortic root and ascending aorta are structurally normal, with no evidence of dilitation. IAS/Shunts: No atrial level shunt detected by color flow Doppler.  LEFT VENTRICLE PLAX 2D LVIDd:         2.60 cm     Diastology LVIDs:         2.00 cm     LV e' medial:    5.87 cm/s LV PW:         1.20 cm     LV E/e' medial:  6.0 LV IVS:        1.20 cm     LV e' lateral:   11.30 cm/s LVOT diam:     2.00 cm     LV E/e' lateral: 3.1 LV SV:         25 LV SV Index:   15 LVOT Area:     3.14 cm  LV Volumes (MOD) LV vol d, MOD A4C: 17.0 ml LV vol s, MOD A4C: 10.5 ml LV SV MOD A4C:     17.0 ml RIGHT VENTRICLE RV Basal diam:  4.30 cm RV S prime:     7.72 cm/s TAPSE (M-mode): 1.6 cm LEFT ATRIUM             Index        RIGHT ATRIUM           Index LA diam:        3.40 cm 1.99 cm/m   RA Area:     42.90 cm LA Vol (A2C):   67.6 ml 39.48 ml/m  RA Volume:   218.00 ml 127.31 ml/m LA Vol (A4C):   17.7 ml 10.34 ml/m LA Biplane Vol: 35.7 ml 20.85 ml/m  AORTIC VALVE AV Area (Vmax):    2.32 cm AV Area (Vmean):   2.02 cm AV Area (VTI):     2.37 cm AV Vmax:           83.20 cm/s AV Vmean:          59.100 cm/s AV VTI:            0.106 m AV Peak Grad:      2.8 mmHg AV Mean Grad:      2.0 mmHg LVOT Vmax:         61.40 cm/s LVOT Vmean:        38.000 cm/s LVOT VTI:          0.080 m LVOT/AV VTI ratio: 0.75  AORTA  Ao Root diam: 3.15 cm MITRAL VALVE               TRICUSPID VALVE MV Area (PHT): 8.92 cm    TR Peak grad:   74.0 mmHg MV Decel Time: 85 msec     TR Vmax:        430.00 cm/s MV E velocity: 35.10 cm/s MV A velocity: 74.60 cm/s  SHUNTS MV E/A ratio:  0.47        Systemic VTI:  0.08 m                            Systemic Diam: 2.00 cm Serafina Royals MD Electronically signed by Serafina Royals MD Signature Date/Time: 03/07/2022/12:58:25 PM    Final    CT CHEST W CONTRAST  Result Date: 03/07/2022 CLINICAL DATA:  Soft tissue mass. EXAM: CT CHEST WITH CONTRAST TECHNIQUE: Multidetector CT imaging of the chest was performed during intravenous contrast administration. RADIATION DOSE REDUCTION: This exam was performed according to the departmental dose-optimization program which includes automated exposure control, adjustment of the mA and/or kV according to patient size and/or use of iterative reconstruction technique. CONTRAST:  87m OMNIPAQUE IOHEXOL 300 MG/ML  SOLN COMPARISON:  CTA of the chest of March 06, 2022. FINDINGS: Cardiovascular: As noted on the exam performed yesterday, there is again noted large saddle embolus extending from proximal right pulmonary artery and through left pulmonary artery into the left lower lobe branches. This is not significantly changed compared to yesterday. Atherosclerosis of thoracic aorta is noted without aneurysm or dissection. No pericardial effusion is noted. Mild cardiomegaly is noted. Mediastinum/Nodes: Thyroid gland is unremarkable. The esophagus appears normal. 2.3 cm right hilar lymph node is noted. Lungs/Pleura: No pneumothorax or pleural effusion is noted. Emphysematous disease and scarring is noted throughout both lungs. Stable 5 mm nodule is noted in right lower lobe best seen on image number 73 of series 3. Stable 3 mm nodule is noted in left upper lobe best seen on image number 39 of series 3. Stable 3 mm nodules also seen in left upper lobe best seen on image number 49 of series 3. Upper Abdomen: No acute abnormality. Musculoskeletal: No chest wall abnormality. No acute or significant osseous findings. IMPRESSION: There is stable large pulmonary embolus extending primarily through the left pulmonary artery and into the left lower lobe branches as described on prior exam. 2.3 cm right hilar lymph node is  noted which may be inflammatory in etiology, but neoplasm cannot be excluded. Follow-up CT scan with intravenous contrast is recommended in 2-3 months to evaluate stability. Extensive emphysematous disease and scarring is noted throughout both lungs as described on prior exam. Multiple pulmonary nodules are again noted as described on prior exam performed yesterday, with the largest measuring 5 mm in the right lower lobe. No follow-up needed if patient is low-risk (and has no known or suspected primary neoplasm). Non-contrast chest CT can be considered in 12 months if patient is high-risk. This recommendation follows the consensus statement: Guidelines for Management of Incidental Pulmonary Nodules Detected on CT Images: From the Fleischner Society 2017; Radiology 2017; 284:228-243. Aortic Atherosclerosis (ICD10-I70.0) and Emphysema (ICD10-J43.9). Electronically Signed   By: JMarijo ConceptionM.D.   On: 03/07/2022 11:14   [place]ous Img Lower Bilateral (DVT)  Result Date: 03/07/2022 CLINICAL DATA:  Patient with pulmonary embolism.  Evaluate for DVT. EXAM: BILATERAL LOWER EXTREMITY VENOUS DOPPLER ULTRASOUND TECHNIQUE: Gray-scale sonography with graded compression,  as well as color Doppler and duplex ultrasound were performed to evaluate the lower extremity deep venous systems from the level of the common femoral vein and including the common femoral, femoral, profunda femoral, popliteal and calf veins including the posterior tibial, peroneal and gastrocnemius veins when visible. The superficial great saphenous vein was also interrogated. Spectral Doppler was utilized to evaluate flow at rest and with distal augmentation maneuvers in the common femoral, femoral and popliteal veins. COMPARISON:  None Available. FINDINGS: RIGHT LOWER EXTREMITY Common Femoral Vein: No evidence of thrombus. Normal compressibility, respiratory phasicity and response to augmentation. Saphenofemoral Junction: No evidence of thrombus. Normal  compressibility and flow on color Doppler imaging. Profunda Femoral Vein: No evidence of thrombus. Normal compressibility and flow on color Doppler imaging. Femoral Vein: No evidence of thrombus. Normal compressibility, respiratory phasicity and response to augmentation. Popliteal Vein: No evidence of thrombus. Normal compressibility, respiratory phasicity and response to augmentation. Calf Veins: No evidence of thrombus. Normal compressibility and flow on color Doppler imaging. Other Findings:  None. LEFT LOWER EXTREMITY Common Femoral Vein: No evidence of thrombus. Normal compressibility, respiratory phasicity and response to augmentation. Saphenofemoral Junction: No evidence of thrombus. Normal compressibility and flow on color Doppler imaging. Profunda Femoral Vein: No evidence of thrombus. Normal compressibility and flow on color Doppler imaging. Femoral Vein: No evidence of thrombus. Normal compressibility, respiratory phasicity and response to augmentation. Popliteal Vein: No evidence of thrombus. Normal compressibility, respiratory phasicity and response to augmentation. Calf Veins: No evidence of thrombus. Normal compressibility and flow on color Doppler imaging. Other Findings:  None. IMPRESSION: No evidence of deep venous thrombosis in either lower extremity. Electronically Signed   By: Markus Daft M.D.   On: 03/07/2022 07:29   CT Angio Chest PE W/Cm &/Or Wo Cm  Result Date: 03/06/2022 CLINICAL DATA:  CT Abdomen Pelvis W Contrast suspected, high prob Abdominal pain, acute, nonlocalized EXAM: CT ANGIOGRAPHY CHEST WITH CONTRAST TECHNIQUE: Multidetector CT imaging of the chest was performed using the standard protocol during bolus administration of intravenous contrast. Multiplanar CT image reconstructions and MIPs were obtained to evaluate the vascular anatomy. RADIATION DOSE REDUCTION: This exam was performed according to the departmental dose-optimization program which includes automated exposure  control, adjustment of the mA and/or kV according to patient size and/or use of iterative reconstruction technique. CONTRAST:  52m OMNIPAQUE IOHEXOL 350 MG/ML SOLN COMPARISON:  CT chest 03/05/2020 FINDINGS: Cardiovascular: Satisfactory opacification of the pulmonary arteries to the segmental level. Nonocclusive saddle embolus extending to the left lower lobe segmental and subsegmental levels as well as into the proximal right main pulmonary artery. Enlarged right heart with marked enlargement of the right atria. There is increased right to left ventricular ratio of at least 1.5. Retrograde reflux of intravenous contrast into a dilated inferior vena cava and hepatic veins. The main pulmonary artery is enlarged measuring up to 3.7 cm. No significant pericardial effusion. The thoracic aorta is normal in caliber. No atherosclerotic plaque of the thoracic aorta. No coronary artery calcifications. Mediastinum/Nodes: No enlarged mediastinal, hilar, or axillary lymph nodes. Thyroid gland, trachea, and esophagus demonstrate no significant findings. Lungs/Pleura: Severe centrilobular and paraseptal emphysematous changes. Scattered areas of reticulations, scarring, and bronchiectasis. No focal consolidation. 0.8 x 0.4 cm right middle lobe pulmonary nodule. Adjacent 0.7 x 0.6 cm ground-glass subpleural nodule (6:49). 0.5 x 0.5 cm left upper lobe pulmonary nodule (6:33). Left upper lobe pulmonary micronodule (6:27). Several other scattered pulmonary micronodules. No pulmonary mass. No pleural effusion. No pneumothorax. Upper Abdomen: Please see  separately dictated CT abdomen pelvis 03/06/2022 Musculoskeletal: Subcutaneus soft tissue edema. No suspicious lytic or blastic osseous lesions. No acute displaced fracture. Multilevel degenerative changes of the spine. Chronic appearing minimal vertebral body height loss of the L4-L5 level. Review of the MIP images confirms the above findings. IMPRESSION: 1. Nonocclusive saddle  pulmonary embolus extending to the left lower lobe segmental and subsegmental levels as well as into the proximal right main pulmonary artery. Associated right heart strain. No pulmonary infarction. 2.  Emphysema (ICD10-J43.9) - severe. 3. Scattered pulmonary micronodules as well as a 0.8 x 0.4 cm right middle lobe pulmonary nodule and adjacent 0.7 x 0.6 cm ground-glass subpleural nodule. Initial follow-up with CT at 6 months is recommended to confirm persistence. If persistent, repeat CT is recommended every 2 years until 5 years of stability has been established. This recommendation follows the consensus statement: Guidelines for Management of Incidental Pulmonary Nodules Detected on CT Images: From the Fleischner Society 2017; Radiology 2017; 284:228-243. These results were called by telephone at the time of interpretation on 03/06/2022 at 3:21 pm to provider [facility] , who verbally acknowledged these results. Electronically Signed   By: Iven Finn M.D.   On: 03/06/2022 15:32   CT Abdomen Pelvis W Contrast  Result Date: 03/06/2022 CLINICAL DATA:  Abdominal pain EXAM: CT ABDOMEN AND PELVIS WITH CONTRAST TECHNIQUE: Multidetector CT imaging of the abdomen and pelvis was performed using the standard protocol following bolus administration of intravenous contrast. RADIATION DOSE REDUCTION: This exam was performed according to the departmental dose-optimization program which includes automated exposure control, adjustment of the mA and/or kV according to patient size and/or use of iterative reconstruction technique. CONTRAST:  61m OMNIPAQUE IOHEXOL 350 MG/ML SOLN COMPARISON:  None Available. FINDINGS: Lower chest: Centrilobular and panlobular emphysema is seen in the lower lung fields. Prominence of interstitial markings in the lower lung fields suggest scarring. Heart is enlarged in size. Hepatobiliary: Liver measures 18.3 cm in length. There is fatty infiltration. There is no dilation of bile ducts.  Small amount of fluid adjacent to the gallbladder may be part of ascites. There is no significant wall thickening in gallbladder. Gallbladder is not distended. Pancreas: No focal abnormalities are seen. Spleen: Spleen is not seen. Surgical clips on the left hemidiaphragms suggest possible previous splenectomy. Adrenals/Urinary Tract: There is mild hyperplasia of left adrenal. There is no hydronephrosis. There are no renal or ureteral stones. Urinary bladder is not distended. Stomach/Bowel: Stomach is not distended. Small bowel loops are not dilated. Appendix is not seen. There is no significant wall thickening in colon. Vascular/Lymphatic: Unremarkable. Reproductive: Unremarkable. Other: Small ascites is present. There is no pneumoperitoneum. Left inguinal hernia containing fat is seen. There is subcutaneous edema suggesting anasarca. Musculoskeletal: Slight decrease in height of upper end plate of body of L4 vertebra may be residual from previous injury. Degenerative changes are noted in the lumbar spine, more so at L4-L5 and L5-S1 levels. There is minimal retrolisthesis at L4-L5 level. IMPRESSION: There is no evidence of intestinal obstruction or pneumoperitoneum. There is no hydronephrosis. Enlarged fatty liver. Small ascites. Severe COPD. Lumbar spondylosis. Electronically Signed   By: PElmer PickerM.D.   On: 03/06/2022 15:23   DG Chest 2 View  Result Date: 03/06/2022 CLINICAL DATA:  Shortness of breath. EXAM: CHEST - 2 VIEW COMPARISON:  CT scan of March 05, 2020. FINDINGS: Mild cardiomegaly is noted. Emphysematous disease is noted throughout both lungs as well as coarse reticular opacities concerning for scarring or fibrosis. No definite  acute abnormality is noted. Bony thorax is unremarkable. IMPRESSION: Emphysematous changes noted bilaterally as well as coarse reticular opacities bilaterally concerning for fibrosis or scarring. No definite acute abnormality is noted. Aortic Atherosclerosis  (ICD10-I70.0) and Emphysema (ICD10-J43.9). Electronically Signed   By: Marijo Conception M.D.   On: 03/06/2022 14:05    Anti-infectives: Anti-infectives (From admission, onward)    Start     Dose/Rate Route Frequency Ordered Stop   03/07/22 1432  ceFAZolin (ANCEF) IVPB 1 g/50 mL premix  Status:  Discontinued        over 30 Minutes  Continuous PRN 03/07/22 1439 03/07/22 1536   03/07/22 0804  ceFAZolin (ANCEF) IVPB 2g/100 mL premix  Status:  Discontinued        2 g 200 mL/hr over 30 Minutes Intravenous 30 min pre-op 03/07/22 0804 03/07/22 1536       Assessment/Plan: s/p Procedure(s): PULMONARY THROMBECTOMY (N/A) POD #1 s/p  Contrast injection right heart; Thrombolysis with total of 8 mg of tPA, 4 mg in each of the main pulmonary arteries; Mechanical thrombectomy to the left lower lobe pulmonary artery and the right lower and middle lobe pulmonary arteries with the penumbra CAT 12 device;Selective catheter placement right lower lobe, middle lobe, and upper lobe pulmonary artery;Selective catheter placement left lower lobe and upper lobe pulmonary arteries  Continue anticoagulation Continue Supportive care               LOS: 2 days    Jamesetta So A 03/08/2022

## 2022-03-08 NOTE — Progress Notes (Signed)
[place] for initiation and monitoring of heparin infusion Indication: pulmonary embolus  Allergies  Allergen Reactions   Hydroxychloroquine     headaches   Meclizine     headaches    Patient Measurements: Height: _0  (165.1 cm) Weight: 63.5 kg (140 lb) IBW/kg (Calculated) : 57 Heparin Dosing Weight: 63.5 kg  Vital Signs: Temp: 98 F (36.7 C) (08/12 0130) Temp Source: Oral (08/12 0130) BP: 99/80 (08/12 0500) Pulse Rate: 83 (08/12 0434)  Labs: Recent Labs     0000 03/06/22 1218 03/06/22 1340 03/06/22 1341 03/06/22 1756 03/06/22 2352 03/07/22 0558 03/07/22 2132 03/08/22 0350  HGB   < >  --  18.3*  --   --   --  16.7*  --  14.4  HCT  --   --  55.6*  --   --   --  50.7*  --  45.5  PLT  --   --  168  --   --   --  167  --  145*  APTT  --   --   --   --  >200*  --   --   --   --   LABPROT  --   --   --   --  19.6*  --   --   --   --   INR  --   --   --   --  1.7*  --   --   --   --   HEPARINUNFRC  --   --   --   --   --    < > 0.93* 0.38 0.52  CREATININE  --  1.24*  --   --   --   --  1.14*  --  1.15*  TROPONINIHS  --   --   --  40*  --   --   --   --   --    < > = values in this interval not displayed.     Estimated Creatinine Clearance: 46.8 mL/min (A) (by C-G formula based on SCr of 1.15 mg/dL (H)).   Medical History: Past Medical History:  Diagnosis Date   COPD (chronic obstructive pulmonary disease) ([name])    Discoid lupus    Eczema    Esophageal dysmotility    Genital herpes    GERD (gastroesophageal reflux disease)    HSV (herpes simplex virus) infection    Hypertension    Lumbago    Lumbar disc disease    Peripheral neuropathy    Pulmonary fibrosis ([name])    Pulmonary hypertension ([name])    Raynaud's disease    Sclerodactyly    Scleroderma ([name])    Spinal stenosis     Assessment:  61 y.o. female w/ PMH of HTN, SLE, scleroderma, pulmonary fibrosis, pulmonary hypertension, and esophageal dysmotility. CT  angio reveals Nonocclusive saddle pulmonary embolus extending to the left lower lobe segmental and subsegmental levels as well as into the proximal right main pulmonary artery. A review of medical records reveals no chronic anticoagulation  Goal of Therapy:  Heparin level 0.3-0.7 units/ml Monitor platelets by anticoagulation protocol: Yes  8/10 2352 HL 0.66, therapeutic x 1 8/11 0558 HL 0.93, supratherapeutic 8/11 2132 HL 0.38, therapeutic x 1 8/12 0350 HL 0.52, therapeutic x 2   Plan:  Continue heparin infusion at 950 units/hr Will recheck HL w/ AM labs daily while therapeutic CBC daily while on heparin  Renda Rolls, PharmD, [facility] 03/08/2022  5:28 AM

## 2022-03-08 NOTE — Progress Notes (Signed)
PROGRESS NOTE    Tammy Barrett   KGU:[id number] DOB: 1961/03/27  DOA: 03/06/2022 Date of Service: 03/08/22 PCP: Dion Body, MD     Brief Narrative / Hospital Course:  Tammy Barrett is a 61 year old female with hypertension, neuropathy, GERD, pulmonary hypertension, scleroderma, COPD, hepatosteatosis, mixed connective tissue disease, long-term use of immunosuppressant medication, pulmonary fibrosis, who presents to the emergency department 03/06/2022 for chief concerns of poor appetite. 08/10: Initial vitals heart rate of 103, blood pressure 123/103, SpO2 of 72% on room air with improvement to 95% on 5 L nasal cannula. BNP was elevated at 1738.4, high sensitive troponin was 40, creatinine 1.24.  No concerns on CT abdomen/pelvis.  CTA chest (+)Nonocclusive saddle pulmonary embolus extending to the left lower lobe segmental and subsegmental levels as well as into the proximal right main pulmonary artery. Associated right heart strain. No pulmonary infarction.  Severe emphysema, multiple pulmonary nodules to follow-up outpatient. Heparin GTT per pharmacy was initiated.  Patient was admitted to hospitalist service with vascular consult for saddle PE. EDP had already consulted vascular who recommended heparin bolus and patient to be taken to the OR for thrombectomy in the a.m.  08/11: thrombectomy planned. I was contacted by radiologist Dr. Denna Haggard this morning and due to abnormal appearance of presumed clot and no evidence for DVT as well as significantly dilated RA, radiology is recommending CT chest with contrast to evaluate for potential mediastinal mass as opposed to pulmonary embolus, prior to taking her for thrombectomy.  Confirmed PE.  Also noted 2.3 cm right hilar lymph node, possible inflammatory, recommending follow-up CT with IV contrast 2 to 3 months. Echocardiogram --> LVEF 65 to 70%, normal function, normal diastolic parameters.  Right ventricular systolic function mildly  reduced, moderately increased RV wall thickness, severely elevated pulmonary artery systolic pressure, right atrial size severely dilated.  Underwent thrombolysis/thrombectomy.  08/12: Remains on heparin drip per vascular surgery.  Still on HFNC - requiring 10 L/min this morning but improved to 5 L/min later in the day. Pt reports SOB is overall better.    Consultants:  Vascular surgery  Procedures: thrombectomy of saddle pulmonary embolus 03/07/2022    Subjective: Patient reports SOB is overall better.      ASSESSMENT & PLAN:   Principal Problem:   Pulmonary emboli ([name]) Active Problems:   Essential hypertension   Pulmonary fibrosis ([name])   Pulmonary hypertension ([name])   Mixed connective tissue disease ([name])   GERD (gastroesophageal reflux disease)   Acute on chronic respiratory failure with hypoxia ([name])   Emphysema/COPD ([name])   Lymphadenopathy, mediastinal   Pulmonary emboli ([name]) stepdown, inpatient heparin GTT per pharmacy Echocardiogram --> LVEF 65 to 70%, normal function, normal diastolic parameters.  Right ventricular systolic function mildly reduced, moderately increased RV wall thickness, severely elevated pulmonary artery systolic pressure, right atrial size severely dilated. DVT negative Vascular surgery following, managing heparin gtt,  s/p thrombectomy 03/07/2022  Acute on chronic respiratory failure with hypoxia ([name]) Secondary to pulmonary emboli Patient at baseline wears oxygen supplementation as needed for emphysema  Continue oxygen supplementation to maintain SpO2 greater than 92% Requiring HFNC at 10 L/min, may need to consult pulmonology if unable to wean down  Mixed connective tissue disease ([place]) status post Plaquenil, prednisone, Imuran Currently on mycophenolate 500 mg p.o. twice daily, resumed  Pulmonary fibrosis ([place]) Pulmonary hypertension Continue outpatient follow-up with pulmonologist Resumed home tadalafil 20 mg  daily  Lymphadenopathy, mediastinal 2.3 cm right hilar lymph node, possible inflammatory, noted on CT chest  03/07/22  Outpatient follow-up CT with IV contrast 2 to 3 months  Emphysema/COPD [facility]) Extensive lung disease, see above for pulmonary fibrosis No COPD exacerbation    DVT prophylaxis: Currently on heparin drip, pending vascular procedure Code Status: Full code Family Communication: none at this time  Disposition Plan / TOC needs: Anticipate discharge back to previous home environment, may need PT/OT evaluation Barriers to discharge / significant pending items: anticipate discharge in the next few days pending vascular surgery clearance / improvement in oxygenation, may need pulmonary consult              Objective: Vitals:   03/08/22 1100 03/08/22 1207 03/08/22 1300 03/08/22 1405  BP: 93/78  99/74   Pulse:  88    Resp: 20 17 (!) 22 18  Temp:  97.6 F (36.4 C)  (!) 97.4 F (36.3 C)  TempSrc:  Axillary  Axillary  SpO2: 94% 99%    Weight:      Height:        Intake/Output Summary (Last 24 hours) at 03/08/2022 1459 Last data filed at 03/08/2022 1400 Gross per 24 hour  Intake 680.32 ml  Output 950 ml  Net -269.68 ml   Filed Weights   03/06/22 1234 03/06/22 1700 03/07/22 1216  Weight: 63.5 kg 64.6 kg 63.5 kg    Examination:  Constitutional:  VS as above General Appearance: alert, well-developed, well-nourished, NAD Neck: No masses, trachea midline Respiratory: Normal respiratory effort No wheeze No rales Cardiovascular: S1/S2 normal No lower extremity edema Gastrointestinal: No tenderness Musculoskeletal:  No clubbing/cyanosis of digits Symmetrical movement in all extremities Neurological: No cranial nerve deficit on limited exam Alert Psychiatric: Normal judgment/insight Normal mood and affect       Scheduled Medications:   amLODipine  10 mg Oral Daily   Chlorhexidine Gluconate Cloth  6 each Topical Q0600   gabapentin  600 mg  Oral TID   mycophenolate  500 mg Oral BID   pantoprazole  40 mg Oral Daily   tadalafil (PAH)  20 mg Oral Daily    Continuous Infusions:  heparin 950 Units/hr (03/08/22 1400)    PRN Medications:  acetaminophen **OR** acetaminophen, fluticasone, hydrALAZINE, HYDROmorphone (DILAUDID) injection, ondansetron **OR** ondansetron (ZOFRAN) IV, senna-docusate  Antimicrobials:  Anti-infectives (From admission, onward)    Start     Dose/Rate Route Frequency Ordered Stop   03/07/22 1432  ceFAZolin (ANCEF) IVPB 1 g/50 mL premix  Status:  Discontinued        over 30 Minutes  Continuous PRN 03/07/22 1439 03/07/22 1536   03/07/22 0804  ceFAZolin (ANCEF) IVPB 2g/100 mL premix  Status:  Discontinued        2 g 200 mL/hr over 30 Minutes Intravenous 30 min pre-op 03/07/22 0804 03/07/22 1536       Data Reviewed: I have personally reviewed following labs and imaging studies  CBC: Recent Labs  Lab 03/06/22 1340 03/07/22 0558 03/08/22 0350  WBC 5.4 6.2 6.3  NEUTROABS 3.9  --   --   HGB 18.3* 16.7* 14.4  HCT 55.6* 50.7* 45.5  MCV 95.5 95.1 98.3  PLT 168 167 505*   Basic Metabolic Panel: Recent Labs  Lab 03/06/22 1218 03/07/22 0558 03/08/22 0350  NA 136 138 136  K 5.0 4.1 4.2  CL 104 109 109  CO2 _0 GLUCOSE 111* 93 131*  BUN 24* 24* 25*  CREATININE 1.24* 1.14* 1.15*  CALCIUM 8.8* 8.4* 7.8*   GFR: Estimated Creatinine Clearance: 46.8 mL/min (A) (by  C-G formula based on SCr of 1.15 mg/dL (H)). Liver Function Tests: Recent Labs  Lab 03/06/22 1218  AST 40  ALT 14  ALKPHOS 424*  BILITOT 2.7*  PROT 7.9  ALBUMIN 3.2*   Recent Labs  Lab 03/06/22 1341  LIPASE 26   No results for input(s): "AMMONIA" in the last 168 hours. Coagulation Profile: Recent Labs  Lab 03/06/22 1756  INR 1.7*   Cardiac Enzymes: No results for input(s): "CKTOTAL", "CKMB", "CKMBINDEX", "TROPONINI" in the last 168 hours. BNP (last 3 results) No results for input(s): "PROBNP" in the last 8760  hours. HbA1C: No results for input(s): "HGBA1C" in the last 72 hours. CBG: Recent Labs  Lab 03/08/22 1201  GLUCAP 135*   Lipid Profile: No results for input(s): "CHOL", "HDL", "LDLCALC", "TRIG", "CHOLHDL", "LDLDIRECT" in the last 72 hours. Thyroid Function Tests: No results for input(s): "TSH", "T4TOTAL", "FREET4", "T3FREE", "THYROIDAB" in the last 72 hours. Anemia Panel: No results for input(s): "VITAMINB12", "FOLATE", "FERRITIN", "TIBC", "IRON", "RETICCTPCT" in the last 72 hours. Urine analysis: No results found for: "COLORURINE", "APPEARANCEUR", "LABSPEC", "PHURINE", "GLUCOSEU", "HGBUR", "BILIRUBINUR", "KETONESUR", "PROTEINUR", "UROBILINOGEN", "NITRITE", "LEUKOCYTESUR" Sepsis Labs: _0 (procalcitonin:4,lacticidven:4)  Recent Results (from the past 240 hour(s))  MRSA Next Gen by PCR, Nasal     Status: None   Collection Time: 03/06/22  5:07 PM   Specimen: Nasal Mucosa; Nasal Swab  Result Value Ref Range Status   MRSA by PCR Next Gen NOT DETECTED NOT DETECTED Final    Comment: (NOTE) The GeneXpert MRSA Assay (FDA approved for NASAL specimens only), is one component of a comprehensive MRSA colonization surveillance program. It is not intended to diagnose MRSA infection nor to guide or monitor treatment for MRSA infections. Test performance is not FDA approved in patients less than 7 years old. Performed at [facility], [address], [place], [place] [address]          Radiology Studies: US Venous Img Lower Bilateral (DVT)  Result Date: 03/07/2022 CLINICAL DATA:  Patient with pulmonary embolism.  Evaluate for DVT. EXAM: BILATERAL LOWER EXTREMITY VENOUS DOPPLER ULTRASOUND TECHNIQUE: Gray-scale sonography with graded compression, as well as color Doppler and duplex ultrasound were performed to evaluate the lower extremity deep venous systems from the level of the common femoral vein and including the common femoral, femoral, profunda femoral, popliteal and  calf veins including the posterior tibial, peroneal and gastrocnemius veins when visible. The superficial great saphenous vein was also interrogated. Spectral Doppler was utilized to evaluate flow at rest and with distal augmentation maneuvers in the common femoral, femoral and popliteal veins. COMPARISON:  None Available. FINDINGS: RIGHT LOWER EXTREMITY Common Femoral Vein: No evidence of thrombus. Normal compressibility, respiratory phasicity and response to augmentation. Saphenofemoral Junction: No evidence of thrombus. Normal compressibility and flow on color Doppler imaging. Profunda Femoral Vein: No evidence of thrombus. Normal compressibility and flow on color Doppler imaging. Femoral Vein: No evidence of thrombus. Normal compressibility, respiratory phasicity and response to augmentation. Popliteal Vein: No evidence of thrombus. Normal compressibility, respiratory phasicity and response to augmentation. Calf Veins: No evidence of thrombus. Normal compressibility and flow on color Doppler imaging. Other Findings:  None. LEFT LOWER EXTREMITY Common Femoral Vein: No evidence of thrombus. Normal compressibility, respiratory phasicity and response to augmentation. Saphenofemoral Junction: No evidence of thrombus. Normal compressibility and flow on color Doppler imaging. Profunda Femoral Vein: No evidence of thrombus. Normal compressibility and flow on color Doppler imaging. Femoral Vein: No evidence of thrombus. Normal compressibility, respiratory phasicity and response to  augmentation. Popliteal Vein: No evidence of thrombus. Normal compressibility, respiratory phasicity and response to augmentation. Calf Veins: No evidence of thrombus. Normal compressibility and flow on color Doppler imaging. Other Findings:  None. IMPRESSION: No evidence of deep venous thrombosis in either lower extremity. Electronically Signed   By: Markus Daft M.D.   On: 03/07/2022 07:29            LOS: 2 days      Emeterio Reeve, DO [facility] 03/08/2022, 2:59 PM   Staff may message me via secure chat in [place]  but this may not receive immediate response,  please page for urgent matters!  If 7PM-7AM, please contact night-coverage [email or web address]  Dictation software was used to generate the above note. Typos may occur and escape review, as with typed/written notes. Please contact Dr Sheppard Coil directly for clarity if needed.

## 2022-03-09 DIAGNOSIS — L8990 Pressure ulcer of unspecified site, unspecified stage: Secondary | ICD-10-CM | POA: Insufficient documentation

## 2022-03-09 DIAGNOSIS — J9621 Acute and chronic respiratory failure with hypoxia: Secondary | ICD-10-CM | POA: Diagnosis not present

## 2022-03-09 DIAGNOSIS — J439 Emphysema, unspecified: Secondary | ICD-10-CM | POA: Diagnosis not present

## 2022-03-09 DIAGNOSIS — I2602 Saddle embolus of pulmonary artery with acute cor pulmonale: Secondary | ICD-10-CM | POA: Diagnosis not present

## 2022-03-09 DIAGNOSIS — I10 Essential (primary) hypertension: Secondary | ICD-10-CM | POA: Diagnosis not present

## 2022-03-09 LAB — HEPARIN LEVEL (UNFRACTIONATED): Heparin Unfractionated: 0.52 IU/mL (ref 0.30–0.70)

## 2022-03-09 LAB — BASIC METABOLIC PANEL
Anion gap: 6 (ref 5–15)
BUN: 27 mg/dL — ABNORMAL HIGH (ref 6–20)
CO2: 22 mmol/L (ref 22–32)
Calcium: 7.9 mg/dL — ABNORMAL LOW (ref 8.9–10.3)
Chloride: 107 mmol/L (ref 98–111)
Creatinine, Ser: 1.11 mg/dL — ABNORMAL HIGH (ref 0.44–1.00)
GFR, Estimated: 57 mL/min — ABNORMAL LOW (ref >60)
Glucose, Bld: 118 mg/dL — ABNORMAL HIGH (ref 70–99)
Potassium: 4.2 mmol/L (ref 3.5–5.1)
Sodium: 135 mmol/L (ref 135–145)

## 2022-03-09 LAB — CBC
HCT: 45.9 % (ref 36.0–46.0)
Hemoglobin: 14.6 g/dL (ref 12.0–15.0)
MCH: 31.6 pg (ref 26.0–34.0)
MCHC: 31.8 g/dL (ref 30.0–36.0)
MCV: 99.4 fL (ref 80.0–100.0)
Platelets: 104 10*3/uL — ABNORMAL LOW (ref 150–400)
RBC: 4.62 MIL/uL (ref 3.87–5.11)
RDW: 17.5 % — ABNORMAL HIGH (ref 11.5–15.5)
WBC: 5.8 10*3/uL (ref 4.0–10.5)
nRBC: 0.5 % — ABNORMAL HIGH (ref 0.0–0.2)

## 2022-03-09 MED ORDER — APIXABAN 5 MG PO TABS
10.0000 mg | ORAL_TABLET | Freq: Two times a day (BID) | ORAL | Status: AC
  Administered 2022-03-09 – 2022-03-15 (×14): 10 mg via ORAL
  Filled 2022-03-09 (×14): qty 2

## 2022-03-09 MED ORDER — ORAL CARE MOUTH RINSE: 15.0000 mL | OROMUCOSAL | Status: DC | PRN

## 2022-03-09 MED ORDER — APIXABAN 5 MG PO TABS
5.0000 mg | ORAL_TABLET | Freq: Two times a day (BID) | ORAL | Status: DC
  Administered 2022-03-16 – 2022-03-24 (×17): 5 mg via ORAL
  Filled 2022-03-09 (×17): qty 1

## 2022-03-09 NOTE — Progress Notes (Signed)
[place] for initiation and monitoring of heparin infusion Indication: pulmonary embolus  Allergies  Allergen Reactions   Hydroxychloroquine     headaches   Meclizine     headaches    Patient Measurements: Height: _0  (165.1 cm) Weight: 63.5 kg (140 lb) IBW/kg (Calculated) : 57 Heparin Dosing Weight: 63.5 kg  Vital Signs: Temp: 99.5 F (37.5 C) (08/13 0100) Temp Source: Oral (08/13 0100) BP: 105/78 (08/13 0500) Pulse Rate: 95 (08/13 0211)  Labs: Recent Labs    03/06/22 1341 03/06/22 1756 03/06/22 2352 03/07/22 0558 03/07/22 2132 03/08/22 0350 03/09/22 0530  HGB  --   --   --  16.7*  --  14.4 14.6  HCT  --   --   --  50.7*  --  45.5 45.9  PLT  --   --   --  167  --  145* 104*  APTT  --  >200*  --   --   --   --   --   LABPROT  --  19.6*  --   --   --   --   --   INR  --  1.7*  --   --   --   --   --   HEPARINUNFRC  --   --    < > 0.93* 0.38 0.52 0.52  CREATININE  --   --   --  1.14*  --  1.15* 1.11*  TROPONINIHS 40*  --   --   --   --   --   --    < > = values in this interval not displayed.     Estimated Creatinine Clearance: 48.5 mL/min (A) (by C-G formula based on SCr of 1.11 mg/dL (H)).   Medical History: Past Medical History:  Diagnosis Date   COPD (chronic obstructive pulmonary disease) ([name])    Discoid lupus    Eczema    Esophageal dysmotility    Genital herpes    GERD (gastroesophageal reflux disease)    HSV (herpes simplex virus) infection    Hypertension    Lumbago    Lumbar disc disease    Peripheral neuropathy    Pulmonary fibrosis ([name])    Pulmonary hypertension ([name])    Raynaud's disease    Sclerodactyly    Scleroderma ([name])    Spinal stenosis     Assessment:  61 y.o. female w/ PMH of HTN, SLE, scleroderma, pulmonary fibrosis, pulmonary hypertension, and esophageal dysmotility. CT angio reveals Nonocclusive saddle pulmonary embolus extending to the left lower lobe segmental and subsegmental  levels as well as into the proximal right main pulmonary artery. A review of medical records reveals no chronic anticoagulation  Goal of Therapy:  Heparin level 0.3-0.7 units/ml Monitor platelets by anticoagulation protocol: Yes  8/10 2352 HL 0.66, therapeutic x 1 8/11 0558 HL 0.93, supratherapeutic 8/11 2132 HL 0.38, therapeutic x 1 8/12 0350 HL 0.52, therapeutic x 2 8/13 0530 HL 0.52, therapeutic x 3   Plan:  Continue heparin infusion at 950 units/hr Will recheck HL w/ AM labs daily while therapeutic CBC daily while on heparin  Renda Rolls, PharmD, [facility] 03/09/2022 5:59 AM

## 2022-03-09 NOTE — Progress Notes (Addendum)
[place] for transitionining from heparin to Eliquis Indication: pulmonary embolus  Allergies  Allergen Reactions   Hydroxychloroquine     headaches   Meclizine     headaches    Patient Measurements: Height: _0  (165.1 cm) Weight: 63.5 kg (140 lb) IBW/kg (Calculated) : 57  Vital Signs: Temp: 97.4 F (36.3 C) (08/13 0800) Temp Source: Oral (08/13 0800) BP: 101/75 (08/13 1100) Pulse Rate: 92 (08/13 1100)  Labs: Recent Labs    03/06/22 1341 03/06/22 1756 03/06/22 2352 03/07/22 0558 03/07/22 2132 03/08/22 0350 03/09/22 0530  HGB  --   --   --  16.7*  --  14.4 14.6  HCT  --   --   --  50.7*  --  45.5 45.9  PLT  --   --   --  167  --  145* 104*  APTT  --  >200*  --   --   --   --   --   LABPROT  --  19.6*  --   --   --   --   --   INR  --  1.7*  --   --   --   --   --   HEPARINUNFRC  --   --    < > 0.93* 0.38 0.52 0.52  CREATININE  --   --   --  1.14*  --  1.15* 1.11*  TROPONINIHS 40*  --   --   --   --   --   --    < > = values in this interval not displayed.     Estimated Creatinine Clearance: 48.5 mL/min (A) (by C-G formula based on SCr of 1.11 mg/dL (H)).   Medical History: Past Medical History:  Diagnosis Date   COPD (chronic obstructive pulmonary disease) ([name])    Discoid lupus    Eczema    Esophageal dysmotility    Genital herpes    GERD (gastroesophageal reflux disease)    HSV (herpes simplex virus) infection    Hypertension    Lumbago    Lumbar disc disease    Peripheral neuropathy    Pulmonary fibrosis ([name])    Pulmonary hypertension ([name])    Raynaud's disease    Sclerodactyly    Scleroderma ([name])    Spinal stenosis     Assessment:  61 yo F w/ PMH of HTN, SLE, scleroderma, pulmonary fibrosis, pulmonary hypertension, and esophageal dysmotility. CT angio reveals nonocclusive saddle pulmonary embolus extending to the left lower lobe segmental and subsegmental levels as well as into the proximal right  main pulmonary artery. Hgb stable, Hct stable, PLT trending down. A review of medical records reveals patient takes ASA 81 mg daily but no chronic anticoagulation PTA. Pt's estimated copay for Eliquis is $0.   Plan: Discontinue UFH drip At time of UFH discontinuation, transition to Apixaban 10 mg BID for PE: 8/13 >> 8/19 Apixaban 5 mg BID for PE: 8/20 >> thereafter CBC every 3 days while on DOAC  Dara Hoyer, PharmD PGY-1 Pharmacy Resident 03/09/2022 1:34 PM

## 2022-03-09 NOTE — Progress Notes (Signed)
Right groin vascular access site noted with an area 3.5 cm X 3.5 CM X 0 that is an open  wound, the top layer of skin has sloughed off, leaving the area bright red. Site cleansed with saline and then covered with vasoline gauze, 2X2 gauze and then tegaderm.

## 2022-03-09 NOTE — Progress Notes (Signed)
PROGRESS NOTE    Tammy GARMS   WEX:[id number] DOB: Apr 23, 1961  DOA: 03/06/2022 Date of Service: 03/09/22 PCP: Dion Body, MD     Brief Narrative / Hospital Course:  [name] is a 61 year old female with hypertension, neuropathy, GERD, pulmonary hypertension, scleroderma, COPD, hepatosteatosis, mixed connective tissue disease, long-term use of immunosuppressant medication, pulmonary fibrosis, who presents to the emergency department 03/06/2022 for chief concerns of poor appetite. 08/10: Initial vitals heart rate of 103, blood pressure 123/103, SpO2 of 72% on room air with improvement to 95% on 5 L nasal cannula. BNP was elevated at 1738.4, high sensitive troponin was 40, creatinine 1.24.  No concerns on CT abdomen/pelvis.  CTA chest (+)Nonocclusive saddle pulmonary embolus extending to the left lower lobe segmental and subsegmental levels as well as into the proximal right main pulmonary artery. Associated right heart strain. No pulmonary infarction.  Severe emphysema, multiple pulmonary nodules to follow-up outpatient. Heparin GTT per pharmacy was initiated.  Patient was admitted to hospitalist service stepdown status with vascular consult for saddle PE. EDP had already consulted vascular who recommended heparin bolus and patient to be taken to the OR for thrombectomy in the a.m.  08/11: thrombectomy planned. I was contacted by radiologist Dr. Denna Haggard this morning and due to abnormal appearance of presumed clot and no evidence for DVT as well as significantly dilated RA, radiology is recommending CT chest with contrast to evaluate for potential mediastinal mass as opposed to pulmonary embolus, prior to taking her for thrombectomy.  Confirmed PE.  Also noted 2.3 cm right hilar lymph node, possible inflammatory, recommending follow-up CT with IV contrast 2 to 3 months. Echocardiogram --> LVEF 65 to 70%, normal function, normal diastolic parameters.  Right ventricular systolic  function mildly reduced, moderately increased RV wall thickness, severely elevated pulmonary artery systolic pressure, right atrial size severely dilated.  Underwent thrombolysis/thrombectomy.  08/12: Remains on heparin drip.  HFNC - requiring 10 L/min this morning but improved to 5 L/min later in the day. Pt reports SOB is overall better. Transferred to progressive status.  08/13: still requiring 6 /min O2. Will d/c heparin gtt 48 hours after thrombectomy (would be this afternoon) and transition to po anticoagulation pending pharmacy eval for coverage (Eliquis vs other)    Consultants:  Vascular surgery  Procedures: thrombectomy of saddle pulmonary embolus 03/07/2022    Subjective: Patient reports SOB is overall better.      ASSESSMENT & PLAN:   Principal Problem:   Pulmonary emboli ([name]) Active Problems:   Essential hypertension   Pulmonary fibrosis ([name])   Pulmonary hypertension ([name])   Mixed connective tissue disease ([name])   GERD (gastroesophageal reflux disease)   Acute on chronic respiratory failure with hypoxia ([name])   Emphysema/COPD ([name])   Lymphadenopathy, mediastinal   Pressure injury of skin   Pulmonary emboli ([name]) Vascular surgery following,s/p thrombectomy 03/07/2022 heparin GTT --> po anticoagulation 03/09/22  Echocardiogram --> LVEF 65 to 70%, normal function, normal diastolic parameters.  Right ventricular systolic function mildly reduced, moderately increased RV wall thickness, severely elevated pulmonary artery systolic pressure, right atrial size severely dilated. DVT negative  Acute on chronic respiratory failure with hypoxia ([name]) Secondary to pulmonary emboli Patient at baseline wears oxygen supplementation as needed for emphysema  Continue oxygen supplementation to maintain SpO2 greater than 92% Requiring HFNC at 6-10 L/min, may need to consult pulmonology if unable to wean down  Mixed connective tissue disease ([place]) status post Plaquenil,  prednisone, Imuran Currently on mycophenolate 500 mg p.o.  twice daily, resumed  Pulmonary fibrosis [facility]) Pulmonary hypertension Continue outpatient follow-up with pulmonologist Resumed home tadalafil 20 mg daily  Lymphadenopathy, mediastinal 2.3 cm right hilar lymph node, possible inflammatory, noted on CT chest 03/07/22  Outpatient follow-up CT with IV contrast 2 to 3 months  Emphysema/COPD [facility]) Extensive lung disease, see above for pulmonary fibrosis No COPD exacerbation    DVT prophylaxis: Currently on heparin drip --> Eliquis this afternoon  Code Status: Full code Family Communication: none at this time  Disposition Plan / TOC needs: Anticipate discharge back to previous home environment, may need PT/OT evaluation Barriers to discharge / significant pending items: anticipate discharge in the next 1-2 days pending vascular surgery clearance / improvement in oxygenation, may need pulmonary consult              Objective: Vitals:   03/09/22 0800 03/09/22 0900 03/09/22 1000 03/09/22 1100  BP: 105/75 (!) 146/108 91/72 101/75  Pulse: 87 93 93 92  Resp: 12 (!) 21 17 (!) 23  Temp: (!) 97.4 F (36.3 C)     TempSrc: Oral     SpO2: 100% (!) 86% 95% 96%  Weight:      Height:        Intake/Output Summary (Last 24 hours) at 03/09/2022 1309 Last data filed at 03/09/2022 1000 Gross per 24 hour  Intake 503.97 ml  Output 350 ml  Net 153.97 ml   Filed Weights   03/06/22 1234 03/06/22 1700 03/07/22 1216  Weight: 63.5 kg 64.6 kg 63.5 kg    Examination:  Constitutional:  VS as above General Appearance: alert, well-developed, well-nourished, NAD Neck: No masses, trachea midline Respiratory: Normal respiratory effort No wheeze No rales Cardiovascular: S1/S2 normal No lower extremity edema Gastrointestinal: No tenderness Musculoskeletal:  No clubbing/cyanosis of digits Symmetrical movement in all extremities Neurological: No cranial nerve deficit on limited  exam Alert Psychiatric: Normal judgment/insight Normal mood and affect       Scheduled Medications:   amLODipine  10 mg Oral Daily   Chlorhexidine Gluconate Cloth  6 each Topical Q0600   gabapentin  600 mg Oral TID   mycophenolate  500 mg Oral BID   pantoprazole  40 mg Oral Daily   tadalafil (PAH)  20 mg Oral Daily    Continuous Infusions:  heparin 950 Units/hr (03/09/22 1000)    PRN Medications:  acetaminophen **OR** acetaminophen, fluticasone, hydrALAZINE, HYDROmorphone (DILAUDID) injection, ondansetron **OR** ondansetron (ZOFRAN) IV, mouth rinse, senna-docusate  Antimicrobials:  Anti-infectives (From admission, onward)    Start     Dose/Rate Route Frequency Ordered Stop   03/07/22 1432  ceFAZolin (ANCEF) IVPB 1 g/50 mL premix  Status:  Discontinued        over 30 Minutes  Continuous PRN 03/07/22 1439 03/07/22 1536   03/07/22 0804  ceFAZolin (ANCEF) IVPB 2g/100 mL premix  Status:  Discontinued        2 g 200 mL/hr over 30 Minutes Intravenous 30 min pre-op 03/07/22 0804 03/07/22 1536       Data Reviewed: I have personally reviewed following labs and imaging studies  CBC: Recent Labs  Lab 03/06/22 1340 03/07/22 0558 03/08/22 0350 03/09/22 0530  WBC 5.4 6.2 6.3 5.8  NEUTROABS 3.9  --   --   --   HGB 18.3* 16.7* 14.4 14.6  HCT 55.6* 50.7* 45.5 45.9  MCV 95.5 95.1 98.3 99.4  PLT 168 167 145* 220*   Basic Metabolic Panel: Recent Labs  Lab 03/06/22 1218 03/07/22 0558 03/08/22 0350 03/09/22  0530  NA 136 138 136 135  K 5.0 4.1 4.2 4.2  CL 104 109 109 107  CO2 _0 GLUCOSE 111* 93 131* 118*  BUN 24* 24* 25* 27*  CREATININE 1.24* 1.14* 1.15* 1.11*  CALCIUM 8.8* 8.4* 7.8* 7.9*   GFR: Estimated Creatinine Clearance: 48.5 mL/min (A) (by C-G formula based on SCr of 1.11 mg/dL (H)). Liver Function Tests: Recent Labs  Lab 03/06/22 1218  AST 40  ALT 14  ALKPHOS 424*  BILITOT 2.7*  PROT 7.9  ALBUMIN 3.2*   Recent Labs  Lab 03/06/22 1341   LIPASE 26   No results for input(s): "AMMONIA" in the last 168 hours. Coagulation Profile: Recent Labs  Lab 03/06/22 1756  INR 1.7*   Cardiac Enzymes: No results for input(s): "CKTOTAL", "CKMB", "CKMBINDEX", "TROPONINI" in the last 168 hours. BNP (last 3 results) No results for input(s): "PROBNP" in the last 8760 hours. HbA1C: No results for input(s): "HGBA1C" in the last 72 hours. CBG: Recent Labs  Lab 03/08/22 1201 03/08/22 1631  GLUCAP 135* 126*   Lipid Profile: No results for input(s): "CHOL", "HDL", "LDLCALC", "TRIG", "CHOLHDL", "LDLDIRECT" in the last 72 hours. Thyroid Function Tests: No results for input(s): "TSH", "T4TOTAL", "FREET4", "T3FREE", "THYROIDAB" in the last 72 hours. Anemia Panel: No results for input(s): "VITAMINB12", "FOLATE", "FERRITIN", "TIBC", "IRON", "RETICCTPCT" in the last 72 hours. Urine analysis: No results found for: "COLORURINE", "APPEARANCEUR", "LABSPEC", "PHURINE", "GLUCOSEU", "HGBUR", "BILIRUBINUR", "KETONESUR", "PROTEINUR", "UROBILINOGEN", "NITRITE", "LEUKOCYTESUR" Sepsis Labs: _1 (procalcitonin:4,lacticidven:4)  Recent Results (from the past 240 hour(s))  MRSA Next Gen by PCR, Nasal     Status: None   Collection Time: 03/06/22  5:07 PM   Specimen: Nasal Mucosa; Nasal Swab  Result Value Ref Range Status   MRSA by PCR Next Gen NOT DETECTED NOT DETECTED Final    Comment: (NOTE) The GeneXpert MRSA Assay (FDA approved for NASAL specimens only), is one component of a comprehensive MRSA colonization surveillance program. It is not intended to diagnose MRSA infection nor to guide or monitor treatment for MRSA infections. Test performance is not FDA approved in patients less than 54 years old. Performed at [facility], [address], [place], [place] [address]          Radiology Studies: US Venous Img Lower Bilateral (DVT)  Result Date: 03/07/2022 CLINICAL DATA:  Patient with pulmonary embolism.  Evaluate for  DVT. EXAM: BILATERAL LOWER EXTREMITY VENOUS DOPPLER ULTRASOUND TECHNIQUE: Gray-scale sonography with graded compression, as well as color Doppler and duplex ultrasound were performed to evaluate the lower extremity deep venous systems from the level of the common femoral vein and including the common femoral, femoral, profunda femoral, popliteal and calf veins including the posterior tibial, peroneal and gastrocnemius veins when visible. The superficial great saphenous vein was also interrogated. Spectral Doppler was utilized to evaluate flow at rest and with distal augmentation maneuvers in the common femoral, femoral and popliteal veins. COMPARISON:  None Available. FINDINGS: RIGHT LOWER EXTREMITY Common Femoral Vein: No evidence of thrombus. Normal compressibility, respiratory phasicity and response to augmentation. Saphenofemoral Junction: No evidence of thrombus. Normal compressibility and flow on color Doppler imaging. Profunda Femoral Vein: No evidence of thrombus. Normal compressibility and flow on color Doppler imaging. Femoral Vein: No evidence of thrombus. Normal compressibility, respiratory phasicity and response to augmentation. Popliteal Vein: No evidence of thrombus. Normal compressibility, respiratory phasicity and response to augmentation. Calf Veins: No evidence of thrombus. Normal compressibility and flow on color Doppler imaging. Other Findings:  None. LEFT LOWER EXTREMITY Common Femoral Vein: No evidence of thrombus. Normal compressibility, respiratory phasicity and response to augmentation. Saphenofemoral Junction: No evidence of thrombus. Normal compressibility and flow on color Doppler imaging. Profunda Femoral Vein: No evidence of thrombus. Normal compressibility and flow on color Doppler imaging. Femoral Vein: No evidence of thrombus. Normal compressibility, respiratory phasicity and response to augmentation. Popliteal Vein: No evidence of thrombus. Normal compressibility, respiratory  phasicity and response to augmentation. Calf Veins: No evidence of thrombus. Normal compressibility and flow on color Doppler imaging. Other Findings:  None. IMPRESSION: No evidence of deep venous thrombosis in either lower extremity. Electronically Signed   By: Markus Daft M.D.   On: 03/07/2022 07:29            LOS: 3 days      Emeterio Reeve, DO [facility] 03/09/2022, 1:09 PM   Staff may message me via secure chat in [place]  but this may not receive immediate response,  please page for urgent matters!  If 7PM-7AM, please contact night-coverage [email or web address]  Dictation software was used to generate the above note. Typos may occur and escape review, as with typed/written notes. Please contact Dr Sheppard Coil directly for clarity if needed.

## 2022-03-09 NOTE — Progress Notes (Signed)
Tammy Rowan, NP notified of right groin vascular access site noted with an area 3.5 cm X 3.5 CM X 0 that is an open wound, the top layer of skin has sloughed off, leaving the area bright red. Site cleansed with saline and then covered with vasoline gauze, 2X2 gauze and then tegaderm. NP acknowledged and suggests that this is a pressure injury from PAD, ok to cover with vasoline gauze and 2X2 but do not use tegaderm as this may cause more damage to the skin.

## 2022-03-10 ENCOUNTER — Encounter: Payer: Self-pay | Admitting: Vascular Surgery

## 2022-03-10 ENCOUNTER — Telehealth (HOSPITAL_COMMUNITY): Payer: Self-pay | Admitting: Pharmacy Technician

## 2022-03-10 ENCOUNTER — Inpatient Hospital Stay: Payer: Medicare Other

## 2022-03-10 ENCOUNTER — Other Ambulatory Visit (HOSPITAL_COMMUNITY): Payer: Self-pay

## 2022-03-10 DIAGNOSIS — J439 Emphysema, unspecified: Secondary | ICD-10-CM | POA: Diagnosis not present

## 2022-03-10 DIAGNOSIS — J9621 Acute and chronic respiratory failure with hypoxia: Secondary | ICD-10-CM | POA: Diagnosis not present

## 2022-03-10 DIAGNOSIS — I5030 Unspecified diastolic (congestive) heart failure: Secondary | ICD-10-CM | POA: Diagnosis present

## 2022-03-10 DIAGNOSIS — I2602 Saddle embolus of pulmonary artery with acute cor pulmonale: Secondary | ICD-10-CM | POA: Diagnosis not present

## 2022-03-10 DIAGNOSIS — I10 Essential (primary) hypertension: Secondary | ICD-10-CM | POA: Diagnosis not present

## 2022-03-10 LAB — CBC
HCT: 41.8 % (ref 36.0–46.0)
Hemoglobin: 13.5 g/dL (ref 12.0–15.0)
MCH: 31.1 pg (ref 26.0–34.0)
MCHC: 32.3 g/dL (ref 30.0–36.0)
MCV: 96.3 fL (ref 80.0–100.0)
Platelets: 146 10*3/uL — ABNORMAL LOW (ref 150–400)
RBC: 4.34 MIL/uL (ref 3.87–5.11)
RDW: 16.9 % — ABNORMAL HIGH (ref 11.5–15.5)
WBC: 5.8 10*3/uL (ref 4.0–10.5)
nRBC: 0.3 % — ABNORMAL HIGH (ref 0.0–0.2)

## 2022-03-10 MED ORDER — FUROSEMIDE 40 MG PO TABS
40.0000 mg | ORAL_TABLET | Freq: Every day | ORAL | Status: DC
  Administered 2022-03-11: 40 mg via ORAL
  Filled 2022-03-10: qty 1

## 2022-03-10 MED ORDER — FUROSEMIDE 10 MG/ML IJ SOLN
60.0000 mg | Freq: Once | INTRAMUSCULAR | Status: AC
  Administered 2022-03-10: 60 mg via INTRAVENOUS
  Filled 2022-03-10: qty 6

## 2022-03-10 NOTE — Progress Notes (Addendum)
PROGRESS NOTE    Tammy Barrett   ZOX:[id number] DOB: 1960/10/04  DOA: 03/06/2022 Date of Service: 03/10/22 PCP: Dion Body, MD     Brief Narrative / Hospital Course:  Tammy Barrett is a 61 year old female with hypertension, neuropathy, GERD, pulmonary hypertension, scleroderma, COPD, hepatosteatosis, mixed connective tissue disease, long-term use of immunosuppressant medication, pulmonary fibrosis, who presents to the emergency department 03/06/2022 for chief concerns of poor appetite. 08/10: Initial vitals heart rate of 103, blood pressure 123/103, SpO2 of 72% on room air with improvement to 95% on 5 L nasal cannula. BNP was elevated at 1738.4, high sensitive troponin was 40, creatinine 1.24.  No concerns on CT abdomen/pelvis.  CTA chest (+)Nonocclusive saddle pulmonary embolus extending to the left lower lobe segmental and subsegmental levels as well as into the proximal right main pulmonary artery. Associated right heart strain. No pulmonary infarction.  Severe emphysema, multiple pulmonary nodules to follow-up outpatient. Heparin GTT per pharmacy was initiated.  Patient was admitted to hospitalist service stepdown status with vascular consult for saddle PE. EDP had already consulted vascular who recommended heparin bolus and patient to be taken to the OR for thrombectomy in the a.m.  08/11: thrombectomy planned. I was contacted by radiologist Dr. Denna Haggard this morning and due to abnormal appearance of presumed clot and no evidence for DVT as well as significantly dilated RA, radiology is recommending CT chest with contrast to evaluate for potential mediastinal mass as opposed to pulmonary embolus, prior to taking her for thrombectomy.  Confirmed PE.  Also noted 2.3 cm right hilar lymph node, possible inflammatory, recommending follow-up CT with IV contrast 2 to 3 months. Echocardiogram --> LVEF 65 to 70%, normal function, normal diastolic parameters.  Right ventricular systolic  function mildly reduced, moderately increased RV wall thickness, severely elevated pulmonary artery systolic pressure, right atrial size severely dilated.  Underwent thrombolysis/thrombectomy.  08/12: Remains on heparin drip.  HFNC - requiring 10 L/min this morning but improved to 5 L/min later in the day. Pt reports SOB is overall better. Transferred to progressive status.  08/13: still requiring 6 /min O2. D/c heparin gtt 48 hours after thrombectomy and transition Eliquis. RN voiced concern low UOP, continue strict I&O and encouraged po intake  08/14: requiring 8 L/min overnight and maintaining on 5 L/min O2 this morning, no output documented from yesterday. Rales and JVD --> Lasix 60 mg IV x1 and restart home lasix. CXR obtained to futher eval possible underlying pneumonia - images reviewed, stable cardiomegaly and severe background emphysema no new consolidations.    Consultants:  Vascular surgery  Procedures: thrombectomy of saddle pulmonary embolus 03/07/2022    Subjective: Patient reports SOB is a bit worse today and coughing. .      ASSESSMENT & PLAN:   Principal Problem:   Pulmonary emboli ([name]) Active Problems:   Essential hypertension   Pulmonary fibrosis ([name])   Pulmonary hypertension ([name])   Mixed connective tissue disease ([name])   GERD (gastroesophageal reflux disease)   Acute on chronic respiratory failure with hypoxia ([name])   Emphysema/COPD ([name])   Lymphadenopathy, mediastinal   Pressure injury of skin   (HFpEF) heart failure with preserved ejection fraction ([name])   Pulmonary emboli ([name]) Vascular surgery following,s/p thrombectomy 03/07/2022 heparin GTT --> po anticoagulation 03/09/22  Echocardiogram --> LVEF 65 to 70%, normal function, normal diastolic parameters.  Right ventricular systolic function mildly reduced, moderately increased RV wall thickness, severely elevated pulmonary artery systolic pressure, right atrial size severely dilated. DVT  negative  Acute on chronic respiratory failure with hypoxia ([name]) Secondary to pulmonary emboli Patient at baseline wears oxygen supplementation as needed for emphysema  Continue oxygen supplementation to maintain SpO2 greater than 92% Requiring HFNC at 6-10 L/min, may need to consult pulmonology if unable to wean down Resuming diuresis today w/ IV bolus lasix then resume home po lasix if improved   Mixed connective tissue disease ([place]) status post Plaquenil, prednisone, Imuran Currently on mycophenolate 500 mg p.o. twice daily, resumed  Pulmonary fibrosis ([place]) Pulmonary hypertension Continue outpatient follow-up with pulmonologist Resumed home tadalafil 20 mg daily  Lymphadenopathy, mediastinal 2.3 cm right hilar lymph node, possible inflammatory, noted on CT chest 03/07/22  Outpatient follow-up CT with IV contrast 2 to 3 months  Emphysema/COPD [facility]) Extensive lung disease, see above for pulmonary fibrosis No COPD exacerbation  Pulmonary hypertension ([name]) RVC systolic function reduced and dilated RA. Echo as above Concerning for CHF/HFpEF on exam 03/10/22  Lasix 60 mg IV x1  CXR given cough to eval for pneumonia   (HFpEF) heart failure with preserved ejection fraction ([facility]) 03/10/22 evidenced by rales, LE edema, JVD 08/11 Echocardiogram --> LVEF 65 to 70%, normal function, normal diastolic parameters.  Right ventricular systolic function mildly reduced, moderately increased RV wall thickness, severely elevated pulmonary artery systolic pressure, right atrial size severely dilated. diuresis today w/ IV bolus lasix then resume home po lasix if improved  CXR pending to eval for pneumonia as well for completeness     DVT prophylaxis: Eliquis  Code Status: Full code Family Communication: none at this time  Disposition Plan / TOC needs: SNF  Barriers to discharge / significant pending items: anticipate discharge in the next 2-3 days pending vascular surgery clearance /  improvement in oxygenation. Given underlying lung disease may need pulmonary consult              Objective: Vitals:   03/10/22 1200 03/10/22 1300 03/10/22 1400 03/10/22 1500  BP: 114/89 97/72 110/80 116/82  Pulse: 92 98 (!) 51 98  Resp: 18 16 (!) 21 (!) 24  Temp:      TempSrc:      SpO2: (!) 88% (!) 75% 90% 90%  Weight:      Height:        Intake/Output Summary (Last 24 hours) at 03/10/2022 1546 Last data filed at 03/10/2022 0800 Gross per 24 hour  Intake 480 ml  Output 0 ml  Net 480 ml   Filed Weights   03/06/22 1234 03/06/22 1700 03/07/22 1216  Weight: 63.5 kg 64.6 kg 63.5 kg    Examination:  Constitutional:  VS as above General Appearance: alert, well-developed, well-nourished, NAD Neck: No masses, trachea midline Respiratory: Normal respiratory effort No wheeze (+) rales Cardiovascular: S1/S2 normal +1 lower extremity edema (+)JVD Gastrointestinal: No tenderness Musculoskeletal:  No clubbing/cyanosis of digits Symmetrical movement in all extremities Neurological: No cranial nerve deficit on limited exam Alert Psychiatric: Normal judgment/insight Normal mood and affect       Scheduled Medications:   amLODipine  10 mg Oral Daily   apixaban  10 mg Oral BID   Followed by   Derrill Memo ON 03/16/2022] apixaban  5 mg Oral BID   Chlorhexidine Gluconate Cloth  6 each Topical Q0600   [START ON 03/11/2022] furosemide  40 mg Oral Daily   gabapentin  600 mg Oral TID   mycophenolate  500 mg Oral BID   pantoprazole  40 mg Oral Daily   tadalafil (PAH)  20 mg Oral Daily  Continuous Infusions:    PRN Medications:  acetaminophen **OR** acetaminophen, fluticasone, HYDROmorphone (DILAUDID) injection, ondansetron **OR** ondansetron (ZOFRAN) IV, mouth rinse, senna-docusate  Antimicrobials:  Anti-infectives (From admission, onward)    Start     Dose/Rate Route Frequency Ordered Stop   03/07/22 1432  ceFAZolin (ANCEF) IVPB 1 g/50 mL premix  Status:   Discontinued        over 30 Minutes  Continuous PRN 03/07/22 1439 03/07/22 1536   03/07/22 0804  ceFAZolin (ANCEF) IVPB 2g/100 mL premix  Status:  Discontinued        2 g 200 mL/hr over 30 Minutes Intravenous 30 min pre-op 03/07/22 0804 03/07/22 1536       Data Reviewed: I have personally reviewed following labs and imaging studies  CBC: Recent Labs  Lab 03/06/22 1340 03/07/22 0558 03/08/22 0350 03/09/22 0530 03/10/22 0308  WBC 5.4 6.2 6.3 5.8 5.8  NEUTROABS 3.9  --   --   --   --   HGB 18.3* 16.7* 14.4 14.6 13.5  HCT 55.6* 50.7* 45.5 45.9 41.8  MCV 95.5 95.1 98.3 99.4 96.3  PLT 168 167 145* 104* 539*   Basic Metabolic Panel: Recent Labs  Lab 03/06/22 1218 03/07/22 0558 03/08/22 0350 03/09/22 0530  NA 136 138 136 135  K 5.0 4.1 4.2 4.2  CL 104 109 109 107  CO2 _0 GLUCOSE 111* 93 131* 118*  BUN 24* 24* 25* 27*  CREATININE 1.24* 1.14* 1.15* 1.11*  CALCIUM 8.8* 8.4* 7.8* 7.9*   GFR: Estimated Creatinine Clearance: 48.5 mL/min (A) (by C-G formula based on SCr of 1.11 mg/dL (H)). Liver Function Tests: Recent Labs  Lab 03/06/22 1218  AST 40  ALT 14  ALKPHOS 424*  BILITOT 2.7*  PROT 7.9  ALBUMIN 3.2*   Recent Labs  Lab 03/06/22 1341  LIPASE 26   No results for input(s): "AMMONIA" in the last 168 hours. Coagulation Profile: Recent Labs  Lab 03/06/22 1756  INR 1.7*   Cardiac Enzymes: No results for input(s): "CKTOTAL", "CKMB", "CKMBINDEX", "TROPONINI" in the last 168 hours. BNP (last 3 results) No results for input(s): "PROBNP" in the last 8760 hours. HbA1C: No results for input(s): "HGBA1C" in the last 72 hours. CBG: Recent Labs  Lab 03/08/22 1201 03/08/22 1631  GLUCAP 135* 126*   Lipid Profile: No results for input(s): "CHOL", "HDL", "LDLCALC", "TRIG", "CHOLHDL", "LDLDIRECT" in the last 72 hours. Thyroid Function Tests: No results for input(s): "TSH", "T4TOTAL", "FREET4", "T3FREE", "THYROIDAB" in the last 72 hours. Anemia  Panel: No results for input(s): "VITAMINB12", "FOLATE", "FERRITIN", "TIBC", "IRON", "RETICCTPCT" in the last 72 hours. Urine analysis: No results found for: "COLORURINE", "APPEARANCEUR", "LABSPEC", "PHURINE", "GLUCOSEU", "HGBUR", "BILIRUBINUR", "KETONESUR", "PROTEINUR", "UROBILINOGEN", "NITRITE", "LEUKOCYTESUR" Sepsis Labs: _1 (procalcitonin:4,lacticidven:4)  Recent Results (from the past 240 hour(s))  MRSA Next Gen by PCR, Nasal     Status: None   Collection Time: 03/06/22  5:07 PM   Specimen: Nasal Mucosa; Nasal Swab  Result Value Ref Range Status   MRSA by PCR Next Gen NOT DETECTED NOT DETECTED Final    Comment: (NOTE) The GeneXpert MRSA Assay (FDA approved for NASAL specimens only), is one component of a comprehensive MRSA colonization surveillance program. It is not intended to diagnose MRSA infection nor to guide or monitor treatment for MRSA infections. Test performance is not FDA approved in patients less than 12 years old. Performed at [facility], [address], [place], [place] [address]  Radiology Studies: US Venous Img Lower Bilateral (DVT)  Result Date: 03/07/2022 CLINICAL DATA:  Patient with pulmonary embolism.  Evaluate for DVT. EXAM: BILATERAL LOWER EXTREMITY VENOUS DOPPLER ULTRASOUND TECHNIQUE: Gray-scale sonography with graded compression, as well as color Doppler and duplex ultrasound were performed to evaluate the lower extremity deep venous systems from the level of the common femoral vein and including the common femoral, femoral, profunda femoral, popliteal and calf veins including the posterior tibial, peroneal and gastrocnemius veins when visible. The superficial great saphenous vein was also interrogated. Spectral Doppler was utilized to evaluate flow at rest and with distal augmentation maneuvers in the common femoral, femoral and popliteal veins. COMPARISON:  None Available. FINDINGS: RIGHT LOWER EXTREMITY Common Femoral Vein: No  evidence of thrombus. Normal compressibility, respiratory phasicity and response to augmentation. Saphenofemoral Junction: No evidence of thrombus. Normal compressibility and flow on color Doppler imaging. Profunda Femoral Vein: No evidence of thrombus. Normal compressibility and flow on color Doppler imaging. Femoral Vein: No evidence of thrombus. Normal compressibility, respiratory phasicity and response to augmentation. Popliteal Vein: No evidence of thrombus. Normal compressibility, respiratory phasicity and response to augmentation. Calf Veins: No evidence of thrombus. Normal compressibility and flow on color Doppler imaging. Other Findings:  None. LEFT LOWER EXTREMITY Common Femoral Vein: No evidence of thrombus. Normal compressibility, respiratory phasicity and response to augmentation. Saphenofemoral Junction: No evidence of thrombus. Normal compressibility and flow on color Doppler imaging. Profunda Femoral Vein: No evidence of thrombus. Normal compressibility and flow on color Doppler imaging. Femoral Vein: No evidence of thrombus. Normal compressibility, respiratory phasicity and response to augmentation. Popliteal Vein: No evidence of thrombus. Normal compressibility, respiratory phasicity and response to augmentation. Calf Veins: No evidence of thrombus. Normal compressibility and flow on color Doppler imaging. Other Findings:  None. IMPRESSION: No evidence of deep venous thrombosis in either lower extremity. Electronically Signed   By: Markus Daft M.D.   On: 03/07/2022 07:29            LOS: 4 days      Emeterio Reeve, DO [facility] 03/10/2022, 3:46 PM   Staff may message me via secure chat in [place]  but this may not receive immediate response,  please page for urgent matters!  If 7PM-7AM, please contact night-coverage [email or web address]  Dictation software was used to generate the above note. Typos may occur and escape review, as with typed/written notes. Please contact  Dr Sheppard Coil directly for clarity if needed.

## 2022-03-10 NOTE — Evaluation (Signed)
Occupational Therapy Evaluation Patient Details Name: Tammy Barrett MRN: [id number] DOB: 10-16-1960 Today's Date: 03/10/2022   History of Present Illness Pt is a 61 y.o. female presenting to hospital 03/06/22 with c/o decreased appetite, some difficulty swallowing, and leg swelling.  Pt admitted with PE (large saddle pulmonary embolism with R heart strain), and acute on chronic respiratory failure with hypoxia.  S/p thrombectomy of saddle PE 03/07/22.  PMH includes htn, lupus, scleroderma, pulmonary fibrosis, pulmonary htn, mixed connective tissue disease, esophageal dysmotility, COPD, HSV, peripheral neuropathy, and Raynaud's disease.  O2 use as needed.   Clinical Impression   Patient presenting with decreased Ind in self care, balance, functional mobility/transfers, endurance, and safety awareness. Patient reports living at home alone and independently at baseline with use of 2Ls O2 chronically. She reports working at [employer] ~ 20 hours/wk but does not use O2 while there.  Pt fatigues quickly during session and on 6Ls via [place] with O2 saturation ranging from 70's -90's. Unsure if sensor is reading properly on ear. She does not have SOB. Pt transfers into recliner chair with min HHA and breakfast tray set up in front of her. Patient will benefit from acute OT to increase overall independence in the areas of ADLs, functional mobility, and safety awareness in order to safely discharge to next venue of care.      Recommendations for follow up therapy are one component of a multi-disciplinary discharge planning process, led by the attending physician.  Recommendations may be updated based on patient status, additional functional criteria and insurance authorization.   Follow Up Recommendations  Skilled nursing-short term rehab (<3 hours/day)    Assistance Recommended at Discharge Intermittent Supervision/Assistance  Patient can return home with the following A little help with walking and/or  transfers;A little help with bathing/dressing/bathroom;Assistance with cooking/housework;Help with stairs or ramp for entrance;Assist for transportation    Functional Status Assessment  Patient has had a recent decline in their functional status and demonstrates the ability to make significant improvements in function in a reasonable and predictable amount of time.  Equipment Recommendations  Other (comment) (defer to next venue of care)       Precautions / Restrictions Precautions Precautions: Fall Restrictions Weight Bearing Restrictions: No      Mobility Bed Mobility Overal bed mobility: Needs Assistance Bed Mobility: Supine to Sit     Supine to sit: Min assist, HOB elevated     General bed mobility comments: assist for B LE's (pt unable to perform on own); vc's for technique    Transfers Overall transfer level: Needs assistance Equipment used: 1 person hand held assist Transfers: Sit to/from Stand Sit to Stand: Min assist           General transfer comment: assist to steady; vc's for B LE placement      Balance Overall balance assessment: Needs assistance Sitting-balance support: No upper extremity supported, Feet supported Sitting balance-Leahy Scale: Good     Standing balance support: Single extremity supported Standing balance-Leahy Scale: Fair                             ADL either performed or assessed with clinical judgement   ADL Overall ADL's : Needs assistance/impaired Eating/Feeding: Set up;Sitting Eating/Feeding Details (indicate cue type and reason): assist to open containers  General ADL Comments: min A overall for self care tasks and functional transfers     Vision Patient Visual Report: No change from baseline              Pertinent Vitals/Pain Pain Assessment Pain Assessment: No/denies pain     Hand Dominance Right   Extremity/Trunk Assessment Upper Extremity  Assessment Upper Extremity Assessment: Generalized weakness   Lower Extremity Assessment Lower Extremity Assessment: Generalized weakness   Cervical / Trunk Assessment Cervical / Trunk Assessment: Normal   Communication Communication Communication: No difficulties   Cognition Arousal/Alertness: Awake/alert Behavior During Therapy: WFL for tasks assessed/performed Overall Cognitive Status: Within Functional Limits for tasks assessed                                       General Comments  no drainage noted R groin dressing            Home Living Family/patient expects to be discharged to:: Private residence Living Arrangements: Alone Available Help at Discharge: Friend(s);Available PRN/intermittently Type of Home: House Home Access: Stairs to enter [employer] of Steps: 1 (from back entrance) Entrance Stairs-Rails: Left Home Layout: One level     Bathroom Shower/Tub: Occupational psychologist: Standard         Additional Comments: Home O2 use PRN      Prior Functioning/Environment Prior Level of Function : Independent/Modified Independent             Mobility Comments: No recent falls.  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 ADLs Comments: Pt reports Ind for self care tasks        OT Problem List: Decreased strength;Decreased cognition;Decreased activity tolerance;Decreased safety awareness;Impaired balance (sitting and/or standing);Decreased knowledge of use of DME or AE      OT Treatment/Interventions: Self-care/ADL training;Therapeutic exercise;Therapeutic activities;DME and/or AE instruction;Manual therapy;Balance training;Patient/family education;Energy conservation;Neuromuscular education    OT Goals(Current goals can be found in the care plan section) Acute Rehab OT Goals Patient Stated Goal: to get stronger OT Goal Formulation: With patient Time For Goal Achievement:  03/24/22 Potential to Achieve Goals: Good ADL Goals Pt Will Perform Grooming: with supervision;standing Pt Will Perform Lower Body Dressing: with supervision;sit to/from stand Pt Will Transfer to Toilet: with supervision;ambulating Pt Will Perform Toileting - Clothing Manipulation and hygiene: with supervision;sit to/from stand  OT Frequency: Min 2X/week       AM-PAC OT "6 Clicks" Daily Activity     Outcome Measure Help from another person eating meals?: None Help from another person taking care of personal grooming?: A Little Help from another person toileting, which includes using toliet, bedpan, or urinal?: A Little Help from another person bathing (including washing, rinsing, drying)?: A Little Help from another person to put on and taking off regular upper body clothing?: A Little Help from another person to put on and taking off regular lower body clothing?: A Little 6 Click Score: 19   End of Session Nurse Communication: Mobility status  Activity Tolerance: Patient tolerated treatment well Patient left: with call bell/phone within reach;in chair  OT Visit Diagnosis: Unsteadiness on feet (R26.81);Muscle weakness (generalized) (M62.81)                Time: 1224-8250 OT Time Calculation (min): 18 min Charges:  OT General Charges $OT Visit: 1 Visit OT Evaluation $OT Eval Moderate  Complexity: 1 Mod OT Treatments $Therapeutic Activity: 8-22 mins Darleen Crocker, MS, OTR/L , CBIS ascom [phone]  03/10/22, 4:16 PM

## 2022-03-10 NOTE — Telephone Encounter (Signed)
Pharmacy Patient Advocate Encounter  Insurance verification completed.    The patient is insured through Eliquis 5 mg   The patient is currently admitted and ran test claims for the following: [employer] Medicare Part D.  Copays and coinsurance results were relayed to Inpatient clinical team.

## 2022-03-10 NOTE — Evaluation (Signed)
Physical Therapy Evaluation Patient Details Name: Tammy Barrett MRN: [id number] DOB: [birth year] Today's Date: 03/10/2022  History of Present Illness  Pt is a 61 y.o. female presenting to hospital 03/06/22 with c/o decreased appetite, some difficulty swallowing, and leg swelling.  Pt admitted with PE (large saddle pulmonary embolism with R heart strain), and acute on chronic respiratory failure with hypoxia.  S/p thrombectomy of saddle PE 03/07/22.  PMH includes htn, lupus, scleroderma, pulmonary fibrosis, pulmonary htn, mixed connective tissue disease, esophageal dysmotility, COPD, HSV, peripheral neuropathy, and Raynaud's disease.  O2 use as needed.  Clinical Impression  Prior to hospital admission, pt was independent with functional mobility; working part time in dietary; lives alone in 1 level home with 1 step to enter L railing from back entrance; home O2 use PRN.  Currently pt is min assist with transfers; min assist to ambulate a few feet recliner to bed; and min assist sit to supine.  Pt appearing unsteady in standing requiring assist for balance during dynamic standing activities.  Limited activity d/t SOB requiring pacing and rest breaks to recover breathing (difficult to obtain O2 readings during session--nurse aware and recommending focusing on symptoms of SOB).  Pt would benefit from skilled PT to address noted impairments and functional limitations (see below for any additional details).  Upon hospital discharge, pt would benefit from SNF to improve strength, balance, and overall activity tolerance for safe discharge home.    Recommendations for follow up therapy are one component of a multi-disciplinary discharge planning process, led by the attending physician.  Recommendations may be updated based on patient status, additional functional criteria and insurance authorization.  Follow Up Recommendations Skilled nursing-short term rehab (<3 hours/day) Can patient physically be transported  by private vehicle: Yes    Assistance Recommended at Discharge Frequent or constant Supervision/Assistance  Patient can return home with the following  A little help with walking and/or transfers;A little help with bathing/dressing/bathroom;Assistance with cooking/housework;Assist for transportation;Help with stairs or ramp for entrance    Equipment Recommendations Rolling walker (2 wheels);BSC/3in1  Recommendations for Other Services  OT consult    Functional Status Assessment Patient has had a recent decline in their functional status and demonstrates the ability to make significant improvements in function in a reasonable and predictable amount of time.     Precautions / Restrictions Precautions Precautions: Fall Restrictions Weight Bearing Restrictions: No      Mobility  Bed Mobility Overal bed mobility: Needs Assistance Bed Mobility: Sit to Supine       Sit to supine: Min assist, HOB elevated   General bed mobility comments: assist for B LE's (pt unable to perform on own); vc's for technique    Transfers Overall transfer level: Needs assistance Equipment used: 1 person hand held assist Transfers: Sit to/from Stand Sit to Stand: Min assist           General transfer comment: assist to steady; vc's for B LE placement    Ambulation/Gait Ambulation/Gait assistance: Min assist Gait Distance (Feet): 3 Feet (recliner to bed) Assistive device: None   Gait velocity: decreased     General Gait Details: decreased B LE step length/foot clearance/heelstrike; assist to steady  Stairs            Wheelchair Mobility    Modified Rankin (Stroke Patients Only)       Balance Overall balance assessment: Needs assistance Sitting-balance support: No upper extremity supported, Feet supported Sitting balance-Leahy Scale: Good Sitting balance - Comments: steady sitting reaching within  BOS   Standing balance support: No upper extremity supported Standing  balance-Leahy Scale: Fair Standing balance comment: steady static standing with single UE support but requiring min assist for dynamic standing balance                             Pertinent Vitals/Pain Pain Assessment Pain Assessment: No/denies pain   O2 readings 80-90% on 6 L O2 via nasal cannula during session (but did not have a good  O2 pleth most of session).    Home Living Family/patient expects to be discharged to:: Private residence Living Arrangements: Alone Available Help at Discharge: Friend(s);Available PRN/intermittently Type of Home: House Home Access: Stairs to enter Entrance Stairs-Rails: Left Entrance Stairs-Number of Steps: 1 (from back entrance)   Home Layout: One level   Additional Comments: Home O2 use PRN    Prior Function Prior Level of Function : Independent/Modified Independent             Mobility Comments: No recent falls.  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       Hand Dominance        Extremity/Trunk Assessment   Upper Extremity Assessment Upper Extremity Assessment: Defer to OT evaluation    Lower Extremity Assessment Lower Extremity Assessment: Generalized weakness    Cervical / Trunk Assessment Cervical / Trunk Assessment: Normal  Communication   Communication: No difficulties  Cognition Arousal/Alertness: Awake/alert Behavior During Therapy: WFL for tasks assessed/performed Overall Cognitive Status: Within Functional Limits for tasks assessed                                          General Comments General comments (skin integrity, edema, etc.): no drainage noted R groin dressing.  Nursing cleared pt for participation in physical therapy.  Pt agreeable to PT session.  Pt's friend present during session.    Exercises  Vc's for breathing technique and pacing   Assessment/Plan    PT Assessment Patient needs continued PT services  PT Problem List  Decreased strength;Decreased activity tolerance;Decreased balance;Decreased mobility;Decreased knowledge of use of DME;Decreased knowledge of precautions;Cardiopulmonary status limiting activity       PT Treatment Interventions DME instruction;Gait training;Stair training;Functional mobility training;Therapeutic activities;Therapeutic exercise;Balance training;Patient/family education    PT Goals (Current goals can be found in the Care Plan section)  Acute Rehab PT Goals Patient Stated Goal: to go home PT Goal Formulation: With patient Time For Goal Achievement: 03/24/22 Potential to Achieve Goals: Good    Frequency Min 2X/week     Co-evaluation               AM-PAC PT "6 Clicks" Mobility  Outcome Measure Help needed turning from your back to your side while in a flat bed without using bedrails?: None Help needed moving from lying on your back to sitting on the side of a flat bed without using bedrails?: A Little Help needed moving to and from a bed to a chair (including a wheelchair)?: A Little Help needed standing up from a chair using your arms (e.g., wheelchair or bedside chair)?: A Little Help needed to walk in hospital room?: A Little Help needed climbing 3-5 steps with a railing? : A Lot 6 Click Score: 18    End of Session Equipment Utilized During Treatment: Oxygen (  6 L via nasal cannula) Activity Tolerance: Other (comment) (limited d/t SOB with activity) Patient left: in bed;with call bell/phone within reach;with nursing/sitter in room;with family/visitor present;Other (comment) (B heels floating via pillow support) Nurse Communication: Mobility status;Precautions;Other (comment) (pt's SOB; difficulty with O2 readings) PT Visit Diagnosis: Unsteadiness on feet (R26.81);Muscle weakness (generalized) (M62.81);Other abnormalities of gait and mobility (R26.89)    Time: 5997-7414 PT Time Calculation (min) (ACUTE ONLY): 29 min   Charges:   PT Evaluation $PT Eval Low  Complexity: 1 Low PT Treatments $Therapeutic Activity: 8-22 mins       Leitha Bleak, PT 03/10/22, 3:33 PM

## 2022-03-10 NOTE — TOC Benefit Eligibility Note (Signed)
Patient Teacher, English as a foreign language completed.    The patient is currently admitted and upon discharge could be taking Eliquis 5 mg.  The current 30 day co-pay is $0.00.   The patient is insured through [place] Patient Advocate Specialist [place] Patient Advocate Team Direct Number: [phone]  Fax: [phone]

## 2022-03-10 NOTE — Assessment & Plan Note (Addendum)
03/10/22 evidenced by rales, LE edema, JVD 08/11 Echocardiogram --> LVEF 65 to 70%, normal function, normal diastolic parameters.  Right ventricular systolic function mildly reduced, moderately increased RV wall thickness, severely elevated pulmonary artery systolic pressure, right atrial size severely dilated.  diuresis today w/ IV bolus lasix then resume home po lasix if improved   CXR pending to eval for pneumonia as well for completeness

## 2022-03-10 NOTE — Assessment & Plan Note (Signed)
RVC systolic function reduced and dilated RA. Echo as above Concerning for CHF/HFpEF on exam but findings have been stable past few days   Lasix 60 mg IV x1 given 08/14  CXR given cough to eval for pneumonia - no infectious process, no significant edema  Pulmonology for voiding  Continue Lasix 40 mg po bid, strict I&O

## 2022-03-11 DIAGNOSIS — I10 Essential (primary) hypertension: Secondary | ICD-10-CM | POA: Diagnosis not present

## 2022-03-11 DIAGNOSIS — J9621 Acute and chronic respiratory failure with hypoxia: Secondary | ICD-10-CM | POA: Diagnosis not present

## 2022-03-11 DIAGNOSIS — J439 Emphysema, unspecified: Secondary | ICD-10-CM | POA: Diagnosis not present

## 2022-03-11 DIAGNOSIS — I2602 Saddle embolus of pulmonary artery with acute cor pulmonale: Secondary | ICD-10-CM | POA: Diagnosis not present

## 2022-03-11 DIAGNOSIS — E43 Unspecified severe protein-calorie malnutrition: Secondary | ICD-10-CM | POA: Diagnosis present

## 2022-03-11 LAB — BASIC METABOLIC PANEL
Anion gap: 5 (ref 5–15)
BUN: 30 mg/dL — ABNORMAL HIGH (ref 6–20)
CO2: 23 mmol/L (ref 22–32)
Calcium: 8 mg/dL — ABNORMAL LOW (ref 8.9–10.3)
Chloride: 105 mmol/L (ref 98–111)
Creatinine, Ser: 0.94 mg/dL (ref 0.44–1.00)
GFR, Estimated: >60 mL/min (ref >60)
Glucose, Bld: 92 mg/dL (ref 70–99)
Potassium: 4.4 mmol/L (ref 3.5–5.1)
Sodium: 133 mmol/L — ABNORMAL LOW (ref 135–145)

## 2022-03-11 MED ORDER — FUROSEMIDE 40 MG PO TABS
40.0000 mg | ORAL_TABLET | Freq: Two times a day (BID) | ORAL | Status: DC
  Administered 2022-03-11 – 2022-03-24 (×25): 40 mg via ORAL
  Filled 2022-03-11 (×25): qty 1

## 2022-03-11 MED ORDER — ADULT MULTIVITAMIN W/MINERALS CH
1.0000 | ORAL_TABLET | Freq: Every day | ORAL | Status: DC
  Administered 2022-03-11 – 2022-03-24 (×14): 1 via ORAL
  Filled 2022-03-11 (×14): qty 1

## 2022-03-11 MED ORDER — ENSURE ENLIVE PO LIQD
237.0000 mL | Freq: Three times a day (TID) | ORAL | Status: DC
  Administered 2022-03-11 – 2022-03-23 (×33): 237 mL via ORAL

## 2022-03-11 NOTE — TOC Initial Note (Signed)
Transition of Care [facility]) - Initial/Assessment Note    Patient Details  Name: Tammy Barrett MRN: [id number] Date of Birth: October 27, 1960  [facility] CM/SW Contact:    Candie Chroman, LCSW Phone Number: 03/11/2022, 10:09 AM  Clinical Narrative:    CSW met with patient. No supports at bedside. CSW introduced role and explained that therapy recommendations would be discussed. Patient is agreeable to SNF placement. Will wait to send out referral until oxygen requirements improved to avoid denials. She is currently on 9 L. Gave CMS scores for facilities within 25 miles of her zip code. No further concerns. CSW encouraged patient to contact CSW as needed. CSW will continue to follow patient for support and facilitate discharge to SNF once medically stable.              Expected Discharge Plan: Skilled Nursing Facility Barriers to Discharge: Continued Medical Work up   Patient Goals and CMS Choice   CMS Medicare.gov Compare Post Acute Care list provided to:: Patient    Expected Discharge Plan and Services Expected Discharge Plan: [place] Choice: [place] arrangements for the past 2 months: Single Family Home                                      Prior Living Arrangements/Services Living arrangements for the past 2 months: Single Family Home Lives with:: Self Patient language and need for interpreter reviewed:: Yes Do you feel safe going back to the place where you live?: Yes      Need for Family Participation in Patient Care: Yes (Comment)     Criminal Activity/Legal Involvement Pertinent to Current Situation/Hospitalization: No - Comment as needed  Activities of Daily Living Home Assistive Devices/Equipment: None ADL Screening (condition at time of admission) Patient's cognitive ability adequate to safely complete daily activities?: Yes Is the patient deaf or have difficulty hearing?: No Does the  patient have difficulty seeing, even when wearing glasses/contacts?: No Does the patient have difficulty concentrating, remembering, or making decisions?: No Patient able to express need for assistance with ADLs?: Yes Does the patient have difficulty dressing or bathing?: No Independently performs ADLs?: Yes (appropriate for developmental age) Does the patient have difficulty walking or climbing stairs?: No Weakness of Legs: None Weakness of Arms/Hands: None  Permission Sought/Granted Permission sought to share information with : Facility Art therapist granted to share information with : Yes, Verbal Permission Granted     Permission granted to share info w AGENCY: SNF's        Emotional Assessment Appearance:: Appears stated age Attitude/Demeanor/Rapport: Engaged, Gracious Affect (typically observed): Accepting, Appropriate, Calm, Pleasant Orientation: : Oriented to Self, Oriented to Place, Oriented to  Time, Oriented to Situation Alcohol / Substance Use: Not Applicable Psych Involvement: No (comment)  Admission diagnosis:  Acute respiratory failure with hypoxia ([name]) [J96.01] Pulmonary emboli ([name]) [I26.99] Acute saddle pulmonary embolism with acute cor pulmonale ([name]) [I26.02] Patient Active Problem List   Diagnosis Date Noted   (HFpEF) heart failure with preserved ejection fraction ([place]) 03/10/2022   Pressure injury of skin 03/09/2022   Emphysema/COPD ([place]) 03/07/2022   Lymphadenopathy, mediastinal 03/07/2022   Pulmonary emboli ([place]) 03/06/2022   GERD (gastroesophageal reflux disease) 03/06/2022   Acute on chronic respiratory failure with hypoxia ([place]) 03/06/2022   Encounter for general adult medical examination without abnormal findings 11/02/2018  Essential hypertension 09/17/2018   Pulmonary fibrosis ([place]) 12/14/2013   Pulmonary hypertension ([place]) 12/14/2013   Mixed connective tissue disease ([place]) 12/13/2013   Lumbar disc disease 12/13/2013    PCP:  Dion Body, MD Pharmacy:   [facility] [address]), [place] [address] [place]) [place] [address] Phone: [phone] Fax: [phone]     Social Determinants of Health (SDOH) Interventions    Readmission Risk Interventions     No data to display

## 2022-03-11 NOTE — Progress Notes (Signed)
Initial Nutrition Assessment  DOCUMENTATION CODES:   Severe malnutrition in context of chronic illness  INTERVENTION:   -Ensure Enlive po TID, each supplement provides 350 kcal and 20 grams of protein -MVI with minerals daily  NUTRITION DIAGNOSIS:   Severe Malnutrition related to chronic illness (COPD, scleroderma) as evidenced by moderate fat depletion, severe fat depletion, moderate muscle depletion, severe muscle depletion.  GOAL:   Patient will meet greater than or equal to 90% of their needs  MONITOR:   PO intake, Supplement acceptance  REASON FOR ASSESSMENT:   Malnutrition Screening Tool    ASSESSMENT:   Pt with PMH hypertension, neuropathy, GERD, pulmonary hypertension, scleroderma, COPD, hepatosteatosis, mixed connective tissue disease, long-term use of immunosuppressant medication, pulmonary fibrosis, who presents for chief concerns of poor appetite.  Pt admitted with pulmonary emboli.   8/11- s/p thrombectomy  Reviewed I/O's: +239 ml x 24 hours and +616 since admission  Pt sitting up in recliner chair at time of visit. Pt lethargic and answered mostly close ended questions.   Observed breakfast tray; pt consumed about 50% of meal. Documented meal completions 25-50%. Per pt, her appetite has improved this admission and today is the first day that she feels like she is able to eat much. Per pt, she has not really eaten anything since Thursday. Pt also shares her appetite was poor PTA, but unable to provide diet recall. She does not consume supplements at home.   Per pt, her UBW is around 140#. She knows she has lost weight, but unsure how much or when weight loss started. Reviewed wt hx; no wt loss noted over the past 6 months.   Discussed importance of good meal and supplement intake to promote healing.   Medications reviewed and include lasix.  Labs reviewed: Na: 133, CBGS: 126 (inpatient orders for glycemic control are none).    NUTRITION - FOCUSED  PHYSICAL EXAM:  Flowsheet Row Most Recent Value  Orbital Region Severe depletion  Upper Arm Region Moderate depletion  Thoracic and Lumbar Region Severe depletion  Buccal Region Moderate depletion  Temple Region Moderate depletion  Clavicle Bone Region Severe depletion  Clavicle and Acromion Bone Region Severe depletion  Scapular Bone Region Severe depletion  Dorsal Hand Mild depletion  Patellar Region No depletion  Anterior Thigh Region No depletion  Posterior Calf Region No depletion  Edema (RD Assessment) Mild  Hair Reviewed  Eyes Reviewed  Mouth Reviewed  Skin Reviewed  Nails Reviewed       Diet Order:   Diet Order             Diet regular Room service appropriate? Yes; Fluid consistency: Thin  Diet effective now                   EDUCATION NEEDS:   Education needs have been addressed  Skin:  Skin Assessment: Skin Integrity Issues: Skin Integrity Issues:: Stage II Stage II: rt groin  Last BM:  03/09/22  Height:   Ht Readings from Last 1 Encounters:  03/10/22 _0  (1.651 m)    Weight:   Wt Readings from Last 1 Encounters:  03/10/22 68.4 kg    Ideal Body Weight:  56.8 kg  BMI:  Body mass index is 25.11 kg/m.  Estimated Nutritional Needs:   Kcal:  2050-2250  Protein:  105-120 grams  Fluid:  > 2 L    Loistine Chance, RD, LDN, [place] Registered Dietitian II Certified Diabetes Care and Education Specialist Please refer to [facility] for RD  and/or RD on-call/weekend/after hours pager

## 2022-03-11 NOTE — Progress Notes (Signed)
PROGRESS NOTE    JERALYNN VAQUERA   CLE:[id number] DOB: 1960/10/04  DOA: 03/06/2022 Date of Service: 03/11/22 PCP: Dion Body, MD     Brief Narrative / Hospital Course:  Ms. Nikelle Malatesta is a 61 year old female with hypertension, neuropathy, GERD, pulmonary hypertension, scleroderma, COPD, hepatosteatosis, mixed connective tissue disease, long-term use of immunosuppressant medication, pulmonary fibrosis, who presents to the emergency department 03/06/2022 for chief concerns of poor appetite. 08/10: Initial vitals heart rate of 103, blood pressure 123/103, SpO2 of 72% on room air with improvement to 95% on 5 L nasal cannula. BNP was elevated at 1738.4, high sensitive troponin was 40, creatinine 1.24.  No concerns on CT abdomen/pelvis.  CTA chest (+)Nonocclusive saddle pulmonary embolus extending to the left lower lobe segmental and subsegmental levels as well as into the proximal right main pulmonary artery. Associated right heart strain. No pulmonary infarction.  Severe emphysema, multiple pulmonary nodules to follow-up outpatient. Heparin GTT per pharmacy was initiated.  Patient was admitted to hospitalist service stepdown status with vascular consult for saddle PE. EDP had already consulted vascular who recommended heparin bolus and patient to be taken to the OR for thrombectomy in the a.m.  08/11: thrombectomy planned. I was contacted by radiologist Dr. Denna Haggard this morning and due to abnormal appearance of presumed clot and no evidence for DVT as well as significantly dilated RA, radiology is recommending CT chest with contrast to evaluate for potential mediastinal mass as opposed to pulmonary embolus, prior to taking her for thrombectomy.  Confirmed PE.  Also noted 2.3 cm right hilar lymph node, possible inflammatory, recommending follow-up CT with IV contrast 2 to 3 months. Echocardiogram --> LVEF 65 to 70%, normal function, normal diastolic parameters.  Right ventricular systolic  function mildly reduced, moderately increased RV wall thickness, severely elevated pulmonary artery systolic pressure, right atrial size severely dilated.  Underwent thrombolysis/thrombectomy.  08/12: Remains on heparin drip.  HFNC - requiring 10 L/min this morning but improved to 5 L/min later in the day. Pt reports SOB is overall better. Transferred to progressive status.  08/13: still requiring 6 /min O2. D/c heparin gtt 48 hours after thrombectomy and transition Eliquis. RN voiced concern low UOP, continue strict I&O and encouraged po intake  08/14: requiring 8 L/min overnight and maintaining on 5 L/min O2 this morning, no output documented from yesterday. Rales/coarse breath sounds and JVD --> Lasix 60 mg IV x1 and restart home lasix. CXR obtained to futher eval possible underlying pneumonia - images reviewed, stable cardiomegaly and severe background emphysema no new consolidations.  08/15: paged pulmonary Dr [name] 10:53 AM for routine consult. Pt appears fluid overloaded based on LE edema, rales, JVD but given pulm HTN and underlying lung disease will also seek pulmonary recs if any other interventions might be helpful. Continue lasix for now and strict I&O, daily weights.    Consultants:  Vascular surgery  Procedures: thrombectomy of saddle pulmonary embolus 03/07/2022    Subjective: Patient reports SOB is about same today, LE edema still bothering her, Reports cough w/ deep breath. Feels comfortable on 8L [place] at the moment.      ASSESSMENT & PLAN:   Principal Problem:   Pulmonary emboli ([name]) Active Problems:   Essential hypertension   Pulmonary fibrosis ([name])   Pulmonary hypertension ([name])   Mixed connective tissue disease ([name])   GERD (gastroesophageal reflux disease)   Acute on chronic respiratory failure with hypoxia ([name])   Emphysema/COPD ([name])   Lymphadenopathy, mediastinal   Pressure injury of  skin   (HFpEF) heart failure with preserved ejection fraction  ([name])   Pulmonary emboli ([name]) Vascular surgery following,s/p thrombectomy 03/07/2022 heparin GTT --> po anticoagulation 03/09/22  Echocardiogram --> LVEF 65 to 70%, normal function, normal diastolic parameters.  Right ventricular systolic function mildly reduced, moderately increased RV wall thickness, severely elevated pulmonary artery systolic pressure, right atrial size severely dilated. DVT negative  Acute on chronic respiratory failure with hypoxia ([name]) Secondary to pulmonary emboli Patient at baseline wears oxygen supplementation as needed for emphysema  Continue oxygen supplementation to maintain SpO2 greater than 92% Requiring HFNC at 6-10 L/min, may need to consult pulmonology if unable to wean down Resuming diuresis w/ IV bolus lasix 08/14, then increased home po lasix from daily to bid Pulmonology consulted 03/11/22, consider cardiology consult as well    Mixed connective tissue disease ([place]) status post Plaquenil, prednisone, Imuran Currently on mycophenolate 500 mg p.o. twice daily, resumed  Pulmonary fibrosis ([place]) Pulmonary hypertension Pulmonology consulted 03/11/22  Resumed home tadalafil 20 mg daily  Lymphadenopathy, mediastinal 2.3 cm right hilar lymph node, possible inflammatory, noted on CT chest 03/07/22  Outpatient follow-up CT with IV contrast 2 to 3 months  Emphysema/COPD [facility]) Extensive lung disease, see above for pulmonary fibrosis No apparent COPD exacerbation Pulmonology consulted 03/11/22  Pulmonary hypertension ([place]) RVC systolic function reduced and dilated RA. Echo as above Concerning for CHF/HFpEF on exam 03/10/22  Lasix 60 mg IV x1 given 08/14 CXR given cough to eval for pneumonia - no infectious process, no significant edema Pulmonology consulted 03/11/22  Continue Lasix 40 mg po bid, strict I&O  (HFpEF) heart failure with preserved ejection fraction ([place]) 03/10/22 evidenced by rales, LE edema, JVD 08/11 Echocardiogram --> LVEF 65 to  70%, normal function, normal diastolic parameters.  Right ventricular systolic function mildly reduced, moderately increased RV wall thickness, severely elevated pulmonary artery systolic pressure, right atrial size severely dilated. diuresis today w/ IV bolus lasix then resume home po lasix if improved  CXR no significant pulmonary edema Pulmonology consulted 03/11/22, would strongly consider cardiology consult as well      DVT prophylaxis: Eliquis  Code Status: Full code Family Communication: none at this time  Disposition Plan / TOC needs: SNF  Barriers to discharge / significant pending items: anticipate discharge in the next 2-3 days pending vascular surgery clearance / improvement in oxygenation. Given underlying lung disease obtained pulmonary consult, consider cardiology consult as well              Objective: Vitals:   03/10/22 2339 03/10/22 2341 03/11/22 0412 03/11/22 0755  BP: 94/68 100/71 97/69 112/84  Pulse: 94 92 89 85  Resp: _0 Temp: 98 F (36.7 C) 98 F (36.7 C) 98.5 F (36.9 C) 98.9 F (37.2 C)  TempSrc: Oral   Oral  SpO2: 90% 90% (!) 88% 100%  Weight:      Height:        Intake/Output Summary (Last 24 hours) at 03/11/2022 1059 Last data filed at 03/11/2022 8032 Gross per 24 hour  Intake --  Output 1 ml  Net -1 ml   Filed Weights   03/06/22 1700 03/07/22 1216 03/10/22 1743  Weight: 64.6 kg 63.5 kg 68.4 kg    Examination:  Constitutional:  VS as above General Appearance: alert, well-developed, well-nourished, NAD Neck: No masses, trachea midline Respiratory: Normal respiratory effort No wheeze (+) rales/coarse sounds more at bases, similar to yesterday Cardiovascular: S1/S2 normal +1 lower extremity edema - stable from  previous (+)JVD Gastrointestinal: No tenderness Musculoskeletal:  No clubbing/cyanosis of digits Symmetrical movement in all extremities Neurological: No cranial nerve deficit on limited  exam Alert Psychiatric: Normal judgment/insight Normal mood and affect       Scheduled Medications:   amLODipine  10 mg Oral Daily   apixaban  10 mg Oral BID   Followed by   Derrill Memo ON 03/16/2022] apixaban  5 mg Oral BID   Chlorhexidine Gluconate Cloth  6 each Topical Q0600   furosemide  40 mg Oral BID   gabapentin  600 mg Oral TID   mycophenolate  500 mg Oral BID   pantoprazole  40 mg Oral Daily   tadalafil (PAH)  20 mg Oral Daily    Continuous Infusions:    PRN Medications:  acetaminophen **OR** acetaminophen, fluticasone, HYDROmorphone (DILAUDID) injection, ondansetron **OR** ondansetron (ZOFRAN) IV, mouth rinse, senna-docusate  Antimicrobials:  Anti-infectives (From admission, onward)    Start     Dose/Rate Route Frequency Ordered Stop   03/07/22 1432  ceFAZolin (ANCEF) IVPB 1 g/50 mL premix  Status:  Discontinued        over 30 Minutes  Continuous PRN 03/07/22 1439 03/07/22 1536   03/07/22 0804  ceFAZolin (ANCEF) IVPB 2g/100 mL premix  Status:  Discontinued        2 g 200 mL/hr over 30 Minutes Intravenous 30 min pre-op 03/07/22 0804 03/07/22 1536       Data Reviewed: I have personally reviewed following labs and imaging studies  CBC: Recent Labs  Lab 03/06/22 1340 03/07/22 0558 03/08/22 0350 03/09/22 0530 03/10/22 0308  WBC 5.4 6.2 6.3 5.8 5.8  NEUTROABS 3.9  --   --   --   --   HGB 18.3* 16.7* 14.4 14.6 13.5  HCT 55.6* 50.7* 45.5 45.9 41.8  MCV 95.5 95.1 98.3 99.4 96.3  PLT 168 167 145* 104* 944*   Basic Metabolic Panel: Recent Labs  Lab 03/06/22 1218 03/07/22 0558 03/08/22 0350 03/09/22 0530 03/11/22 0441  NA 136 138 136 135 133*  K 5.0 4.1 4.2 4.2 4.4  CL 104 109 109 107 105  CO2 _0 GLUCOSE 111* 93 131* 118* 92  BUN 24* 24* 25* 27* 30*  CREATININE 1.24* 1.14* 1.15* 1.11* 0.94  CALCIUM 8.8* 8.4* 7.8* 7.9* 8.0*   GFR: Estimated Creatinine Clearance: 61.9 mL/min (by C-G formula based on SCr of 0.94 mg/dL). Liver Function  Tests: Recent Labs  Lab 03/06/22 1218  AST 40  ALT 14  ALKPHOS 424*  BILITOT 2.7*  PROT 7.9  ALBUMIN 3.2*   Recent Labs  Lab 03/06/22 1341  LIPASE 26   No results for input(s): "AMMONIA" in the last 168 hours. Coagulation Profile: Recent Labs  Lab 03/06/22 1756  INR 1.7*   Cardiac Enzymes: No results for input(s): "CKTOTAL", "CKMB", "CKMBINDEX", "TROPONINI" in the last 168 hours. BNP (last 3 results) No results for input(s): "PROBNP" in the last 8760 hours. HbA1C: No results for input(s): "HGBA1C" in the last 72 hours. CBG: Recent Labs  Lab 03/08/22 1201 03/08/22 1631  GLUCAP 135* 126*   Lipid Profile: No results for input(s): "CHOL", "HDL", "LDLCALC", "TRIG", "CHOLHDL", "LDLDIRECT" in the last 72 hours. Thyroid Function Tests: No results for input(s): "TSH", "T4TOTAL", "FREET4", "T3FREE", "THYROIDAB" in the last 72 hours. Anemia Panel: No results for input(s): "VITAMINB12", "FOLATE", "FERRITIN", "TIBC", "IRON", "RETICCTPCT" in the last 72 hours. Urine analysis: No results found for: "COLORURINE", "APPEARANCEUR", "LABSPEC", "PHURINE", "GLUCOSEU", "HGBUR", "BILIRUBINUR", "KETONESUR", "PROTEINUR", "UROBILINOGEN", "  NITRITE", "LEUKOCYTESUR" Sepsis Labs: _0 (procalcitonin:4,lacticidven:4)  Recent Results (from the past 240 hour(s))  MRSA Next Gen by PCR, Nasal     Status: None   Collection Time: 03/06/22  5:07 PM   Specimen: Nasal Mucosa; Nasal Swab  Result Value Ref Range Status   MRSA by PCR Next Gen NOT DETECTED NOT DETECTED Final    Comment: (NOTE) The GeneXpert MRSA Assay (FDA approved for NASAL specimens only), is one component of a comprehensive MRSA colonization surveillance program. It is not intended to diagnose MRSA infection nor to guide or monitor treatment for MRSA infections. Test performance is not FDA approved in patients less than 44 years old. Performed at [facility], [address], [place], [place] [address]           Radiology Studies: US Venous Img Lower Bilateral (DVT)  Result Date: 03/07/2022 CLINICAL DATA:  Patient with pulmonary embolism.  Evaluate for DVT. EXAM: BILATERAL LOWER EXTREMITY VENOUS DOPPLER ULTRASOUND TECHNIQUE: Gray-scale sonography with graded compression, as well as color Doppler and duplex ultrasound were performed to evaluate the lower extremity deep venous systems from the level of the common femoral vein and including the common femoral, femoral, profunda femoral, popliteal and calf veins including the posterior tibial, peroneal and gastrocnemius veins when visible. The superficial great saphenous vein was also interrogated. Spectral Doppler was utilized to evaluate flow at rest and with distal augmentation maneuvers in the common femoral, femoral and popliteal veins. COMPARISON:  None Available. FINDINGS: RIGHT LOWER EXTREMITY Common Femoral Vein: No evidence of thrombus. Normal compressibility, respiratory phasicity and response to augmentation. Saphenofemoral Junction: No evidence of thrombus. Normal compressibility and flow on color Doppler imaging. Profunda Femoral Vein: No evidence of thrombus. Normal compressibility and flow on color Doppler imaging. Femoral Vein: No evidence of thrombus. Normal compressibility, respiratory phasicity and response to augmentation. Popliteal Vein: No evidence of thrombus. Normal compressibility, respiratory phasicity and response to augmentation. Calf Veins: No evidence of thrombus. Normal compressibility and flow on color Doppler imaging. Other Findings:  None. LEFT LOWER EXTREMITY Common Femoral Vein: No evidence of thrombus. Normal compressibility, respiratory phasicity and response to augmentation. Saphenofemoral Junction: No evidence of thrombus. Normal compressibility and flow on color Doppler imaging. Profunda Femoral Vein: No evidence of thrombus. Normal compressibility and flow on color Doppler imaging. Femoral Vein: No evidence of thrombus.  Normal compressibility, respiratory phasicity and response to augmentation. Popliteal Vein: No evidence of thrombus. Normal compressibility, respiratory phasicity and response to augmentation. Calf Veins: No evidence of thrombus. Normal compressibility and flow on color Doppler imaging. Other Findings:  None. IMPRESSION: No evidence of deep venous thrombosis in either lower extremity. Electronically Signed   By: Markus Daft M.D.   On: 03/07/2022 07:29            LOS: 5 days      Emeterio Reeve, DO [facility] 03/11/2022, 10:59 AM   Staff may message me via secure chat in [place]  but this may not receive immediate response,  please page for urgent matters!  If 7PM-7AM, please contact night-coverage [email or web address]  Dictation software was used to generate the above note. Typos may occur and escape review, as with typed/written notes. Please contact Dr Sheppard Coil directly for clarity if needed.

## 2022-03-11 NOTE — Progress Notes (Signed)
Physical Therapy Treatment Patient Details Name: Tammy Barrett MRN: [id number] DOB: 1961-02-22 Today's Date: 03/11/2022   History of Present Illness Pt is a 61 y.o. female presenting to hospital 03/06/22 with c/o decreased appetite, some difficulty swallowing, and leg swelling.  Pt admitted with PE (large saddle pulmonary embolism with R heart strain), and acute on chronic respiratory failure with hypoxia.  S/p thrombectomy of saddle PE 03/07/22.  PMH includes htn, lupus, scleroderma, pulmonary fibrosis, pulmonary htn, mixed connective tissue disease, esophageal dysmotility, COPD, HSV, peripheral neuropathy, and Raynaud's disease.  O2 use as needed.    PT Comments    Patient alert, sitting EOB denied pain upon PT entrance. On 8L via HFNC throughout session and mobility, spO2 reading 95% or greater. Session short due to arrival of breakfast; but pt did sit <> stand with handheld assist and step pivot to recliner minA. Pt experienced 1 LOB requiring minA to correct, educated on use of RW, pt agreeable. The pt also performed a few seated therex prior to PT exit. Pt set up with breakfast, all needs in reach.The patient would benefit from further skilled PT intervention to continue to progress towards goals. Recommendation remains appropriate.     Recommendations for follow up therapy are one component of a multi-disciplinary discharge planning process, led by the attending physician.  Recommendations may be updated based on patient status, additional functional criteria and insurance authorization.  Follow Up Recommendations  Skilled nursing-short term rehab (<3 hours/day) Can patient physically be transported by private vehicle: Yes   Assistance Recommended at Discharge Frequent or constant Supervision/Assistance  Patient can return home with the following A little help with walking and/or transfers;A little help with bathing/dressing/bathroom;Assistance with cooking/housework;Assist for  transportation;Help with stairs or ramp for entrance   Equipment Recommendations  Rolling walker (2 wheels);BSC/3in1    Recommendations for Other Services OT consult     Precautions / Restrictions Precautions Precautions: Fall Restrictions Weight Bearing Restrictions: No     Mobility  Bed Mobility               General bed mobility comments: pt sitting EOB at start of session    Transfers Overall transfer level: Needs assistance Equipment used: 1 person hand held assist Transfers: Sit to/from Stand Sit to Stand: Min assist                Ambulation/Gait Ambulation/Gait assistance: Min assist Gait Distance (Feet): 3 Feet Assistive device: 1 person hand held assist   Gait velocity: decreased     General Gait Details: minA for 1 LOB when initiating step pivot transfer   Stairs             Wheelchair Mobility    Modified Rankin (Stroke Patients Only)       Balance Overall balance assessment: Needs assistance Sitting-balance support: No upper extremity supported, Feet supported Sitting balance-Leahy Scale: Good     Standing balance support: Single extremity supported Standing balance-Leahy Scale: Fair                              Cognition Arousal/Alertness: Awake/alert Behavior During Therapy: WFL for tasks assessed/performed Overall Cognitive Status: Within Functional Limits for tasks assessed                                          Exercises Other Exercises  Other Exercises: seated marching, LAQ, heel/toe raises x10 bilaterally    General Comments        Pertinent Vitals/Pain Pain Assessment Pain Assessment: No/denies pain    Home Living                          Prior Function            PT Goals (current goals can now be found in the care plan section) Progress towards PT goals: Progressing toward goals    Frequency    Min 2X/week      PT Plan Current plan remains  appropriate    Co-evaluation              AM-PAC PT "6 Clicks" Mobility   Outcome Measure  Help needed turning from your back to your side while in a flat bed without using bedrails?: None Help needed moving from lying on your back to sitting on the side of a flat bed without using bedrails?: A Little Help needed moving to and from a bed to a chair (including a wheelchair)?: A Little Help needed standing up from a chair using your arms (e.g., wheelchair or bedside chair)?: A Little Help needed to walk in hospital room?: A Little Help needed climbing 3-5 steps with a railing? : A Lot 6 Click Score: 18    End of Session Equipment Utilized During Treatment: Oxygen (8L) Activity Tolerance: Patient tolerated treatment well Patient left: in chair;with call bell/phone within reach Nurse Communication: Mobility status;Other (comment) (O2 status) PT Visit Diagnosis: Unsteadiness on feet (R26.81);Muscle weakness (generalized) (M62.81);Other abnormalities of gait and mobility (R26.89)     Time: 8887-5797 PT Time Calculation (min) (ACUTE ONLY): 9 min  Charges:  $Therapeutic Activity: 8-22 mins                     Lieutenant Diego PT, DPT 9:44 AM,03/11/22

## 2022-03-11 NOTE — Care Management Important Message (Signed)
Important Message  Patient Details  Name: Tammy Barrett MRN: [id number] Date of Birth: 09-08-1960   Medicare Important Message Given:  Yes     Dannette Barbara 03/11/2022, 11:22 AM

## 2022-03-11 NOTE — Progress Notes (Addendum)
Occupational Therapy Treatment Patient Details Name: Tammy Barrett MRN: [id number] DOB: 08-22-1960 Today's Date: 03/11/2022   History of present illness Pt is a 61 y.o. female presenting to hospital 03/06/22 with c/o decreased appetite, some difficulty swallowing, and leg swelling.  Pt admitted with PE (large saddle pulmonary embolism with R heart strain), and acute on chronic respiratory failure with hypoxia.  S/p thrombectomy of saddle PE 03/07/22.  PMH includes htn, lupus, scleroderma, pulmonary fibrosis, pulmonary htn, mixed connective tissue disease, esophageal dysmotility, COPD, HSV, peripheral neuropathy, and Raynaud's disease.  O2 use as needed.   OT comments  Pt. is currently on 8L O2 via HFNC. SpO2 93%, HR 90 bpms. Pt. education was provided about energy conservation, and work simplification techniques for ADLs, and home management IADLs. A visual handout was provided to, and reviewed with the pt. Pt. Education was provided with education about PLB techniques. Pt. Was able to demonstrate proper technique. Pt. Education ws provided about A/E use for LE ADLs, as pt. Reports that it is a struggle to donn, and doff LE clothing over her feet. Pt. Was able to demonstrate the proper technique with minA and cues for visual demonstration. Pt. continues to benefit from OT services for additional review of A/E use for LE ADLs, energy conservation, work simplification strategies, and PLB techniques in order to maximize independence with ADLs, and IADL tasks with appropriate modifications. Pt. would benefit from SNF level of care upon discharge, with follow-up OT services.    Recommendations for follow up therapy are one component of a multi-disciplinary discharge planning process, led by the attending physician.  Recommendations may be updated based on patient status, additional functional criteria and insurance authorization.    Follow Up Recommendations  Skilled nursing-short term rehab (<3 hours/day)     Assistance Recommended at Discharge Intermittent Supervision/Assistance  Patient can return home with the following  A little help with walking and/or transfers;A little help with bathing/dressing/bathroom;Assistance with cooking/housework;Help with stairs or ramp for entrance;Assist for transportation   Equipment Recommendations       Recommendations for Other Services      Precautions / Restrictions Precautions Precautions: Fall Restrictions Weight Bearing Restrictions: No       Mobility Bed Mobility               General bed mobility comments: Pt. up in recliner chair upon arrival.    Transfers Overall transfer level: Needs assistance Equipment used: 1 person hand held assist Transfers: Sit to/from Stand Sit to Stand: Min assist                 Balance     Sitting balance-Leahy Scale: Good                      Mobility per PT report               ADL either performed or assessed with clinical judgement   ADL                                         General ADL Comments: min A for ADL tasks and functional transfers    Extremity/Trunk Assessment Upper Extremity Assessment Upper Extremity Assessment: Generalized weakness            Vision Patient Visual Report: No change from baseline     Perception     Praxis  Cognition Arousal/Alertness: Awake/alert Behavior During Therapy: WFL for tasks assessed/performed Overall Cognitive Status: Within Functional Limits for tasks assessed                                          Exercises      Shoulder Instructions       General Comments      Pertinent Vitals/ Pain        No. Pt. Denies pain.  Home Living                                          Prior Functioning/Environment              Frequency  Min 2X/week        Progress Toward Goals  OT Goals(current goals can now be found in the care plan  section)  Progress towards OT goals: Progressing toward goals  Acute Rehab OT Goals Patient Stated Goal: To get stronger OT Goal Formulation: With patient Time For Goal Achievement: 03/24/22 Potential to Achieve Goals: Good  Plan Discharge plan remains appropriate    Co-evaluation                 AM-PAC OT "6 Clicks" Daily Activity     Outcome Measure   Help from another person eating meals?: None Help from another person taking care of personal grooming?: A Little Help from another person toileting, which includes using toliet, bedpan, or urinal?: A Little Help from another person bathing (including washing, rinsing, drying)?: A Little Help from another person to put on and taking off regular upper body clothing?: A Little Help from another person to put on and taking off regular lower body clothing?: A Little 6 Click Score: 19    End of Session    OT Visit Diagnosis: Unsteadiness on feet (R26.81);Muscle weakness (generalized) (M62.81)   Activity Tolerance Patient tolerated treatment well   Patient Left with call bell/phone within reach;in chair   Nurse Communication Mobility status        Time: 8341-9622 OT Time Calculation (min): 33 min  Charges: OT General Charges $OT Visit: 1 Visit OT Treatments $Self Care/Home Management : 23-37 mins  Harrel Carina, MS, OTR/L   Harrel Carina 03/11/2022, 11:30 AM

## 2022-03-12 DIAGNOSIS — J439 Emphysema, unspecified: Secondary | ICD-10-CM | POA: Diagnosis not present

## 2022-03-12 DIAGNOSIS — I10 Essential (primary) hypertension: Secondary | ICD-10-CM | POA: Diagnosis not present

## 2022-03-12 DIAGNOSIS — J9621 Acute and chronic respiratory failure with hypoxia: Secondary | ICD-10-CM | POA: Diagnosis not present

## 2022-03-12 DIAGNOSIS — I2602 Saddle embolus of pulmonary artery with acute cor pulmonale: Secondary | ICD-10-CM | POA: Diagnosis not present

## 2022-03-12 LAB — BASIC METABOLIC PANEL
Anion gap: 7 (ref 5–15)
BUN: 29 mg/dL — ABNORMAL HIGH (ref 6–20)
CO2: 24 mmol/L (ref 22–32)
Calcium: 8.1 mg/dL — ABNORMAL LOW (ref 8.9–10.3)
Chloride: 105 mmol/L (ref 98–111)
Creatinine, Ser: 0.96 mg/dL (ref 0.44–1.00)
GFR, Estimated: >60 mL/min (ref >60)
Glucose, Bld: 105 mg/dL — ABNORMAL HIGH (ref 70–99)
Potassium: 4.5 mmol/L (ref 3.5–5.1)
Sodium: 136 mmol/L (ref 135–145)

## 2022-03-12 MED ORDER — STERILE WATER FOR INJECTION IJ SOLN
INTRAMUSCULAR | Status: AC
  Administered 2022-03-12: 2 mL
  Filled 2022-03-12: qty 10

## 2022-03-12 MED ORDER — IPRATROPIUM-ALBUTEROL 0.5-2.5 (3) MG/3ML IN SOLN: 3.0000 mL | RESPIRATORY_TRACT | Status: DC | PRN

## 2022-03-12 MED ORDER — AMBRISENTAN 5 MG PO TABS
5.0000 mg | ORAL_TABLET | Freq: Every day | ORAL | Status: DC
Start: 2022-03-13 — End: 2022-03-14

## 2022-03-12 MED ORDER — HYDROCORTISONE 1 % EX CREA: TOPICAL_CREAM | Freq: Four times a day (QID) | CUTANEOUS | Status: DC | PRN

## 2022-03-12 MED ORDER — METHYLPREDNISOLONE SODIUM SUCC 125 MG IJ SOLR
80.0000 mg | INTRAMUSCULAR | Status: AC
  Administered 2022-03-12 – 2022-03-18 (×7): 80 mg via INTRAVENOUS
  Filled 2022-03-12 (×7): qty 2

## 2022-03-12 NOTE — Consult Note (Addendum)
Pulmonary Medicine          Date: 03/12/2022,   MRN# [id number] Tammy Barrett 06/10/1961     AdmissionWeight: 63.5 kg                 CurrentWeight: 68.4 kg      CHIEF COMPLAINT:   Hypoxia, on high fio2   HISTORY OF PRESENT ILLNESS   This is a 61 yr old lady, came in to [facility] 2 c/o increase dyspnea, tachycardia, past hx of ctd, pulmonary fibrosis, chronic cough, pulmonary hypertension, copd with bullae disease. CTA showed:  st (+)Nonocclusive saddle pulmonary embolus extending to the left lower lobe segmental and subsegmental levels as well as into the proximal right main pulmonary artery. Associated right heart strain. No pulmonary infarction.  Severe emphysema, multiple pulmonary nodules. S/p mechanical thrombolectomy.   Presently on 8 liters fio2, last seen she was on 3 liters. Cough is better, peripheral edema slowly decreasing. No clots in legs.   PAST MEDICAL HISTORY   Past Medical History:  Diagnosis Date   COPD (chronic obstructive pulmonary disease) ([name])    Discoid lupus    Eczema    Esophageal dysmotility    Genital herpes    GERD (gastroesophageal reflux disease)    HSV (herpes simplex virus) infection    Hypertension    Lumbago    Lumbar disc disease    Peripheral neuropathy    Pulmonary fibrosis ([place])    Pulmonary hypertension ([name])    Raynaud's disease    Sclerodactyly    Scleroderma ([place])    Spinal stenosis      SURGICAL HISTORY   Past Surgical History:  Procedure Laterality Date   bone removal from pinkie toe     COLONOSCOPY WITH PROPOFOL N/A 08/30/2021   Procedure: COLONOSCOPY WITH PROPOFOL;  Surgeon: [name], MD;  Location: [facility] ENDOSCOPY;  Service: Gastroenterology;  Laterality: N/A;   DILATION AND CURETTAGE OF UTERUS     PULMONARY THROMBECTOMY N/A 03/07/2022   Procedure: PULMONARY THROMBECTOMY;  Surgeon: Algernon Huxley, MD;  Location: [place] CV LAB;  Service: Cardiovascular;  Laterality: N/A;   RIGHT HEART CATH Right  09/17/2018   Procedure: RIGHT HEART CATH;  Surgeon: Teodoro Spray, MD;  Location: [place] [facility] LAB;  Service: Cardiovascular;  Laterality: Right;   SPLENECTOMY     TUBAL LIGATION       FAMILY HISTORY   Family History  Problem Relation Age of Onset   Uterine cancer Mother    Stroke Father    Breast cancer Neg Hx      SOCIAL HISTORY   Social History   Tobacco Use   Smoking status: Former    Packs/day: 0.50    Types: Cigarettes    Quit date: 01/08/2011    Years since quitting: 11.1   Smokeless tobacco: Never  Vaping Use   Vaping Use: Never used  Substance Use Topics   Alcohol use: No   Drug use: No     MEDICATIONS    Home Medication:    Current Medication:  Current Facility-Administered Medications:    amLODipine (NORVASC) tablet 10 mg, 10 mg, Oral, Daily, Dew, Erskine Squibb, MD, 10 mg at 03/12/22 5885   apixaban (ELIQUIS) tablet 10 mg, 10 mg, Oral, BID, 10 mg at 03/12/22 0827 **FOLLOWED BY** [START ON 03/16/2022] apixaban (ELIQUIS) tablet 5 mg, 5 mg, Oral, BID, [name], [facility]   Chlorhexidine Gluconate Cloth 2 % PADS 6 each, 6 each, Topical, Q0600,  Algernon Huxley, MD, 6 each at 03/12/22 3474   feeding supplement (ENSURE ENLIVE / ENSURE PLUS) liquid 237 mL, 237 mL, Oral, TID BM, [name], [name], DO, 237 mL at 03/12/22 1455   fluticasone (FLONASE) 50 MCG/ACT nasal spray 1 spray, 1 spray, Each Nare, Daily PRN, Lucky Cowboy, Erskine Squibb, MD   furosemide (LASIX) tablet 40 mg, 40 mg, Oral, BID, Emeterio Reeve, DO, 40 mg at 03/12/22 2595   gabapentin (NEURONTIN) tablet 600 mg, 600 mg, Oral, TID, Lucky Cowboy, Erskine Squibb, MD, 600 mg at 03/12/22 0827   hydrocortisone cream 1 %, , Topical, QID PRN, Emeterio Reeve, DO   HYDROmorphone (DILAUDID) injection 1 mg, 1 mg, Intravenous, Once PRN, Lucky Cowboy, Erskine Squibb, MD   ipratropium-albuterol (DUONEB) 0.5-2.5 (3) MG/3ML nebulizer solution 3 mL, 3 mL, Nebulization, Q4H PRN, Emeterio Reeve, DO   multivitamin with minerals tablet 1 tablet, 1  tablet, Oral, Daily, Emeterio Reeve, DO, 1 tablet at 03/12/22 6387   mycophenolate (CELLCEPT) capsule 500 mg, 500 mg, Oral, BID, Lucky Cowboy, Erskine Squibb, MD, 500 mg at 03/12/22 5643   Oral care mouth rinse, 15 mL, Mouth Rinse, PRN, Sharion Settler, NP   pantoprazole (PROTONIX) EC tablet 40 mg, 40 mg, Oral, Daily, Dew, Erskine Squibb, MD, 40 mg at 03/12/22 3295   senna-docusate (Senokot-S) tablet 1 tablet, 1 tablet, Oral, QHS PRN, Algernon Huxley, MD, 1 tablet at 03/09/22 1347   tadalafil (PAH) (ADCIRCA) tablet 20 mg, 20 mg, Oral, Daily, Dew, Erskine Squibb, MD, 20 mg at 03/12/22 0825    ALLERGIES   Hydroxychloroquine and Meclizine     REVIEW OF SYSTEMS    Review of Systems:  Gen:  Denies  fever, sweats, chills weigh loss  HEENT: Denies blurred vision, double vision, ear pain, eye pain, hearing loss, nose bleeds, sore throat Cardiac:  No dizziness, chest pain or heaviness, chest tightness,edema Resp:   + cough or sputum porduction, +++ shortness of breath, wheezing, hemoptysis,  Gi: Denies swallowing difficulty, stomach pain, nausea or vomiting, diarrhea, constipation, bowel incontinence Gu:  Denies bladder incontinence, burning urine Ext:   Denies Joint pain, stiffness or swelling Skin: Denies  skin rash, easy bruising or bleeding or hives Endoc:  Denies polyuria, polydipsia , polyphagia or weight change Psych:   Denies depression, insomnia or hallucinations   Other:  All other systems negative   VS: BP 109/76 (BP Location: Right Arm)   Pulse 96   Temp 97.8 F (36.6 C)   Resp (!) 21   Ht _0  (1.651 m)   Wt 68.4 kg   SpO2 90%   BMI 25.11 kg/m      PHYSICAL EXAM    GENERAL:NAD, no fevers, chills, no weakness no fatigue HEAD: Normocephalic, atraumatic.  EYES: Pupils equal, round, reactive to light. Extraocular muscles intact. No scleral icterus.  MOUTH: Moist mucosal membrane. Dentition intact. No abscess noted.  EAR, NOSE, THROAT: Clear without exudates. No external lesions.  NECK:  Supple. No thyromegaly. No nodules. No JVD.  PULMONARY: Diffuse crackles, rare wheeze CARDIOVASCULAR: S1 and S2. Regular rate and rhythm. No murmurs, rubs, or gallops. No edema. Pedal pulses 2+ bilaterally.  GASTROINTESTINAL: Soft, nontender, nondistended. No masses. Positive bowel sounds. No hepatosplenomegaly.  MUSCULOSKELETAL: No swelling, clubbing, or edema. Range of motion full in all extremities.  NEUROLOGIC: Cranial nerves II through XII are intact. No gross focal neurological deficits. Sensation intact. Reflexes intact.  SKIN: No ulceration, lesions, rashes, or cyanosis. Skin warm and dry. Turgor intact.  PSYCHIATRIC: Mood, affect within normal limits.  The patient is awake, alert and oriented x 3. Insight, judgment intact.       IMAGING    DG Chest Port 1 View  Result Date: 03/10/2022 CLINICAL DATA:  Leg swelling EXAM: PORTABLE CHEST 1 VIEW COMPARISON:  Chest x-ray dated March 06, 2022; chest CT dated March 07, 2022 FINDINGS: Unchanged cardiomegaly. Severe background emphysema with areas of nodularity, better evaluated on recent chest CT. No new focal consolidation. The visualized skeletal structures are unremarkable. IMPRESSION: Severe emphysema with no new focal consolidation. Electronically Signed   By: Yetta Glassman M.D.   On: 03/10/2022 10:25   PERIPHERAL VASCULAR CATHETERIZATION  Result Date: 03/07/2022 See surgical note for result.  ECHOCARDIOGRAM COMPLETE  Result Date: 03/07/2022    ECHOCARDIOGRAM REPORT   Patient Name:   KEONDRA HAYDU Date of Exam: 03/07/2022 Medical Rec #:  [id number]        Height:       65.0 in Accession #:    [id number]       Weight:       142.4 lb Date of Birth:  1961/07/05        BSA:          1.712 m Patient Age:    4 years         BP:           118/100 mmHg Patient Gender: F                HR:           95 bpm. Exam Location:  [facility] Procedure: 2D Echo, Cardiac Doppler and Color Doppler Indications:     Pulmonary Embolus I26.09  History:          Patient has no prior history of Echocardiogram examinations.                  COPD and Pulmonary HTN; Risk Factors:Hypertension.  Sonographer:     Sherrie Sport Referring Phys:  [id number] AMY N COX Diagnosing Phys: Serafina Royals MD IMPRESSIONS  1. Left ventricular ejection fraction, by estimation, is 65 to 70%. The left ventricle has normal function. The left ventricle has no regional wall motion abnormalities. Left ventricular diastolic parameters were normal.  2. Right ventricular systolic function is mildly reduced. The right ventricular size is severely enlarged. Moderately increased right ventricular wall thickness. There is severely elevated pulmonary artery systolic pressure.  3. Right atrial size was severely dilated.  4. The mitral valve is normal in structure. Trivial mitral valve regurgitation.  5. Tricuspid valve regurgitation is severe.  6. The aortic valve is normal in structure. Aortic valve regurgitation is trivial. FINDINGS  Left Ventricle: Left ventricular ejection fraction, by estimation, is 65 to 70%. The left ventricle has normal function. The left ventricle has no regional wall motion abnormalities. The left ventricular internal cavity size was small. There is no left ventricular hypertrophy. Left ventricular diastolic parameters were normal. Right Ventricle: The right ventricular size is severely enlarged. Moderately increased right ventricular wall thickness. Right ventricular systolic function is mildly reduced. There is severely elevated pulmonary artery systolic pressure. Left Atrium: Left atrial size was normal in size. Right Atrium: Right atrial size was severely dilated. Pericardium: There is no evidence of pericardial effusion. Mitral Valve: The mitral valve is normal in structure. Trivial mitral valve regurgitation. Tricuspid Valve: The tricuspid valve is normal in structure. Tricuspid valve regurgitation is severe. Aortic Valve: The aortic valve is normal in structure. Aortic valve  regurgitation is  trivial. Aortic valve mean gradient measures 2.0 mmHg. Aortic valve peak gradient measures 2.8 mmHg. Aortic valve area, by VTI measures 2.37 cm. Pulmonic Valve: The pulmonic valve was normal in structure. Pulmonic valve regurgitation is mild. Aorta: The aortic root and ascending aorta are structurally normal, with no evidence of dilitation. IAS/Shunts: No atrial level shunt detected by color flow Doppler.  LEFT VENTRICLE PLAX 2D LVIDd:         2.60 cm     Diastology LVIDs:         2.00 cm     LV e' medial:    5.87 cm/s LV PW:         1.20 cm     LV E/e' medial:  6.0 LV IVS:        1.20 cm     LV e' lateral:   11.30 cm/s LVOT diam:     2.00 cm     LV E/e' lateral: 3.1 LV SV:         25 LV SV Index:   15 LVOT Area:     3.14 cm  LV Volumes (MOD) LV vol d, MOD A4C: 17.0 ml LV vol s, MOD A4C: 10.5 ml LV SV MOD A4C:     17.0 ml RIGHT VENTRICLE RV Basal diam:  4.30 cm RV S prime:     7.72 cm/s TAPSE (M-mode): 1.6 cm LEFT ATRIUM             Index        RIGHT ATRIUM           Index LA diam:        3.40 cm 1.99 cm/m   RA Area:     42.90 cm LA Vol (A2C):   67.6 ml 39.48 ml/m  RA Volume:   218.00 ml 127.31 ml/m LA Vol (A4C):   17.7 ml 10.34 ml/m LA Biplane Vol: 35.7 ml 20.85 ml/m  AORTIC VALVE AV Area (Vmax):    2.32 cm AV Area (Vmean):   2.02 cm AV Area (VTI):     2.37 cm AV Vmax:           83.20 cm/s AV Vmean:          59.100 cm/s AV VTI:            0.106 m AV Peak Grad:      2.8 mmHg AV Mean Grad:      2.0 mmHg LVOT Vmax:         61.40 cm/s LVOT Vmean:        38.000 cm/s LVOT VTI:          0.080 m LVOT/AV VTI ratio: 0.75  AORTA Ao Root diam: 3.15 cm MITRAL VALVE               TRICUSPID VALVE MV Area (PHT): 8.92 cm    TR Peak grad:   74.0 mmHg MV Decel Time: 85 msec     TR Vmax:        430.00 cm/s MV E velocity: 35.10 cm/s MV A velocity: 74.60 cm/s  SHUNTS MV E/A ratio:  0.47        Systemic VTI:  0.08 m                            Systemic Diam: 2.00 cm Serafina Royals MD Electronically signed by  Serafina Royals MD Signature Date/Time: 03/07/2022/12:58:25 PM    Final  CT CHEST W CONTRAST  Result Date: 03/07/2022 CLINICAL DATA:  Soft tissue mass. EXAM: CT CHEST WITH CONTRAST TECHNIQUE: Multidetector CT imaging of the chest was performed during intravenous contrast administration. RADIATION DOSE REDUCTION: This exam was performed according to the departmental dose-optimization program which includes automated exposure control, adjustment of the mA and/or kV according to patient size and/or use of iterative reconstruction technique. CONTRAST:  65m OMNIPAQUE IOHEXOL 300 MG/ML  SOLN COMPARISON:  CTA of the chest of March 06, 2022. FINDINGS: Cardiovascular: As noted on the exam performed yesterday, there is again noted large saddle embolus extending from proximal right pulmonary artery and through left pulmonary artery into the left lower lobe branches. This is not significantly changed compared to yesterday. Atherosclerosis of thoracic aorta is noted without aneurysm or dissection. No pericardial effusion is noted. Mild cardiomegaly is noted. Mediastinum/Nodes: Thyroid gland is unremarkable. The esophagus appears normal. 2.3 cm right hilar lymph node is noted. Lungs/Pleura: No pneumothorax or pleural effusion is noted. Emphysematous disease and scarring is noted throughout both lungs. Stable 5 mm nodule is noted in right lower lobe best seen on image number 73 of series 3. Stable 3 mm nodule is noted in left upper lobe best seen on image number 39 of series 3. Stable 3 mm nodules also seen in left upper lobe best seen on image number 49 of series 3. Upper Abdomen: No acute abnormality. Musculoskeletal: No chest wall abnormality. No acute or significant osseous findings. IMPRESSION: There is stable large pulmonary embolus extending primarily through the left pulmonary artery and into the left lower lobe branches as described on prior exam. 2.3 cm right hilar lymph node is noted which may be inflammatory in  etiology, but neoplasm cannot be excluded. Follow-up CT scan with intravenous contrast is recommended in 2-3 months to evaluate stability. Extensive emphysematous disease and scarring is noted throughout both lungs as described on prior exam. Multiple pulmonary nodules are again noted as described on prior exam performed yesterday, with the largest measuring 5 mm in the right lower lobe. No follow-up needed if patient is low-risk (and has no known or suspected primary neoplasm). Non-contrast chest CT can be considered in 12 months if patient is high-risk. This recommendation follows the consensus statement: Guidelines for Management of Incidental Pulmonary Nodules Detected on CT Images: From the Fleischner Society 2017; Radiology 2017; 284:228-243. Aortic Atherosclerosis (ICD10-I70.0) and Emphysema (ICD10-J43.9). Electronically Signed   By: JMarijo ConceptionM.D.   On: 03/07/2022 11:14   UK[place]Venous Img Lower Bilateral (DVT)  Result Date: 03/07/2022 CLINICAL DATA:  Patient with pulmonary embolism.  Evaluate for DVT. EXAM: BILATERAL LOWER EXTREMITY VENOUS DOPPLER ULTRASOUND TECHNIQUE: Gray-scale sonography with graded compression, as well as color Doppler and duplex ultrasound were performed to evaluate the lower extremity deep venous systems from the level of the common femoral vein and including the common femoral, femoral, profunda femoral, popliteal and calf veins including the posterior tibial, peroneal and gastrocnemius veins when visible. The superficial great saphenous vein was also interrogated. Spectral Doppler was utilized to evaluate flow at rest and with distal augmentation maneuvers in the common femoral, femoral and popliteal veins. COMPARISON:  None Available. FINDINGS: RIGHT LOWER EXTREMITY Common Femoral Vein: No evidence of thrombus. Normal compressibility, respiratory phasicity and response to augmentation. Saphenofemoral Junction: No evidence of thrombus. Normal compressibility and flow on color  Doppler imaging. Profunda Femoral Vein: No evidence of thrombus. Normal compressibility and flow on color Doppler imaging. Femoral Vein: No evidence of thrombus. Normal compressibility,  respiratory phasicity and response to augmentation. Popliteal Vein: No evidence of thrombus. Normal compressibility, respiratory phasicity and response to augmentation. Calf Veins: No evidence of thrombus. Normal compressibility and flow on color Doppler imaging. Other Findings:  None. LEFT LOWER EXTREMITY Common Femoral Vein: No evidence of thrombus. Normal compressibility, respiratory phasicity and response to augmentation. Saphenofemoral Junction: No evidence of thrombus. Normal compressibility and flow on color Doppler imaging. Profunda Femoral Vein: No evidence of thrombus. Normal compressibility and flow on color Doppler imaging. Femoral Vein: No evidence of thrombus. Normal compressibility, respiratory phasicity and response to augmentation. Popliteal Vein: No evidence of thrombus. Normal compressibility, respiratory phasicity and response to augmentation. Calf Veins: No evidence of thrombus. Normal compressibility and flow on color Doppler imaging. Other Findings:  None. IMPRESSION: No evidence of deep venous thrombosis in either lower extremity. Electronically Signed   By: Markus Daft M.D.   On: 03/07/2022 07:29   CT Angio Chest PE W/Cm &/Or Wo Cm  Result Date: 03/06/2022 CLINICAL DATA:  CT Abdomen Pelvis W Contrast suspected, high prob Abdominal pain, acute, nonlocalized EXAM: CT ANGIOGRAPHY CHEST WITH CONTRAST TECHNIQUE: Multidetector CT imaging of the chest was performed using the standard protocol during bolus administration of intravenous contrast. Multiplanar CT image reconstructions and MIPs were obtained to evaluate the vascular anatomy. RADIATION DOSE REDUCTION: This exam was performed according to the departmental dose-optimization program which includes automated exposure control, adjustment of the mA and/or kV  according to patient size and/or use of iterative reconstruction technique. CONTRAST:  68m OMNIPAQUE IOHEXOL 350 MG/ML SOLN COMPARISON:  CT chest 03/05/2020 FINDINGS: Cardiovascular: Satisfactory opacification of the pulmonary arteries to the segmental level. Nonocclusive saddle embolus extending to the left lower lobe segmental and subsegmental levels as well as into the proximal right main pulmonary artery. Enlarged right heart with marked enlargement of the right atria. There is increased right to left ventricular ratio of at least 1.5. Retrograde reflux of intravenous contrast into a dilated inferior vena cava and hepatic veins. The main pulmonary artery is enlarged measuring up to 3.7 cm. No significant pericardial effusion. The thoracic aorta is normal in caliber. No atherosclerotic plaque of the thoracic aorta. No coronary artery calcifications. Mediastinum/Nodes: No enlarged mediastinal, hilar, or axillary lymph nodes. Thyroid gland, trachea, and esophagus demonstrate no significant findings. Lungs/Pleura: Severe centrilobular and paraseptal emphysematous changes. Scattered areas of reticulations, scarring, and bronchiectasis. No focal consolidation. 0.8 x 0.4 cm right middle lobe pulmonary nodule. Adjacent 0.7 x 0.6 cm ground-glass subpleural nodule (6:49). 0.5 x 0.5 cm left upper lobe pulmonary nodule (6:33). Left upper lobe pulmonary micronodule (6:27). Several other scattered pulmonary micronodules. No pulmonary mass. No pleural effusion. No pneumothorax. Upper Abdomen: Please see separately dictated CT abdomen pelvis 03/06/2022 Musculoskeletal: Subcutaneus soft tissue edema. No suspicious lytic or blastic osseous lesions. No acute displaced fracture. Multilevel degenerative changes of the spine. Chronic appearing minimal vertebral body height loss of the L4-L5 level. Review of the MIP images confirms the above findings. IMPRESSION: 1. Nonocclusive saddle pulmonary embolus extending to the left lower  lobe segmental and subsegmental levels as well as into the proximal right main pulmonary artery. Associated right heart strain. No pulmonary infarction. 2.  Emphysema (ICD10-J43.9) - severe. 3. Scattered pulmonary micronodules as well as a 0.8 x 0.4 cm right middle lobe pulmonary nodule and adjacent 0.7 x 0.6 cm ground-glass subpleural nodule. Initial follow-up with CT at 6 months is recommended to confirm persistence. If persistent, repeat CT is recommended every 2 years until 5 years of  stability has been established. This recommendation follows the consensus statement: Guidelines for Management of Incidental Pulmonary Nodules Detected on CT Images: From the Fleischner Society 2017; Radiology 2017; 284:228-243. These results were called by telephone at the time of interpretation on 03/06/2022 at 3:21 pm to provider [facility] , who verbally acknowledged these results. Electronically Signed   By: Iven Finn M.D.   On: 03/06/2022 15:32   CT Abdomen Pelvis W Contrast  Result Date: 03/06/2022 CLINICAL DATA:  Abdominal pain EXAM: CT ABDOMEN AND PELVIS WITH CONTRAST TECHNIQUE: Multidetector CT imaging of the abdomen and pelvis was performed using the standard protocol following bolus administration of intravenous contrast. RADIATION DOSE REDUCTION: This exam was performed according to the departmental dose-optimization program which includes automated exposure control, adjustment of the mA and/or kV according to patient size and/or use of iterative reconstruction technique. CONTRAST:  35m OMNIPAQUE IOHEXOL 350 MG/ML SOLN COMPARISON:  None Available. FINDINGS: Lower chest: Centrilobular and panlobular emphysema is seen in the lower lung fields. Prominence of interstitial markings in the lower lung fields suggest scarring. Heart is enlarged in size. Hepatobiliary: Liver measures 18.3 cm in length. There is fatty infiltration. There is no dilation of bile ducts. Small amount of fluid adjacent to the  gallbladder may be part of ascites. There is no significant wall thickening in gallbladder. Gallbladder is not distended. Pancreas: No focal abnormalities are seen. Spleen: Spleen is not seen. Surgical clips on the left hemidiaphragms suggest possible previous splenectomy. Adrenals/Urinary Tract: There is mild hyperplasia of left adrenal. There is no hydronephrosis. There are no renal or ureteral stones. Urinary bladder is not distended. Stomach/Bowel: Stomach is not distended. Small bowel loops are not dilated. Appendix is not seen. There is no significant wall thickening in colon. Vascular/Lymphatic: Unremarkable. Reproductive: Unremarkable. Other: Small ascites is present. There is no pneumoperitoneum. Left inguinal hernia containing fat is seen. There is subcutaneous edema suggesting anasarca. Musculoskeletal: Slight decrease in height of upper end plate of body of L4 vertebra may be residual from previous injury. Degenerative changes are noted in the lumbar spine, more so at L4-L5 and L5-S1 levels. There is minimal retrolisthesis at L4-L5 level. IMPRESSION: There is no evidence of intestinal obstruction or pneumoperitoneum. There is no hydronephrosis. Enlarged fatty liver. Small ascites. Severe COPD. Lumbar spondylosis. Electronically Signed   By: PElmer PickerM.D.   On: 03/06/2022 15:23   DG Chest 2 View  Result Date: 03/06/2022 CLINICAL DATA:  Shortness of breath. EXAM: CHEST - 2 VIEW COMPARISON:  CT scan of March 05, 2020. FINDINGS: Mild cardiomegaly is noted. Emphysematous disease is noted throughout both lungs as well as coarse reticular opacities concerning for scarring or fibrosis. No definite acute abnormality is noted. Bony thorax is unremarkable. IMPRESSION: Emphysematous changes noted bilaterally as well as coarse reticular opacities bilaterally concerning for fibrosis or scarring. No definite acute abnormality is noted. Aortic Atherosclerosis (ICD10-I70.0) and Emphysema (ICD10-J43.9).  Electronically Signed   By: JMarijo ConceptionM.D.   On: 03/06/2022 14:05    Hiv negative   ASSESSMENT/PLAN  Acute on chronic hypoxia; multifactorial, came in with bilateral pulmonary eeboli, in a lady who was not very active and significant underlying medical problems including; CTD, pulmonary fibrosis, pulmonary hypertension, severe copd with bullae and ground glass infiltrates. May concern is there also an IPF flare. Thank God she survived the blood clots to the lung  Bilateral pulmonary emboli. S/p mechanical thromboembolectomy. She is on anticoagulation- Eliquis. Base on her overall condition, limited ambulation, high risk  of repeat clots, w discussed at a minimum 6 months of eliquis, but most likely will keep her on it indefinitely. 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 However, the increase ground glass infiltrate. ? Underlying IPF flare. Agree with resuming  mycophenolate 500 mg p.o. twice daily and solumedrol 50 mg iv q 12 hrs, lasix as you doing. Protonix 40 mg q day ( gerd id common in IPF). Will discuss with rheum ? Adding esbriet.  Pulmonary hypertension. 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 Hence on adcirca. 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 Will add letaris and if need be tyvaso Copd, with bullae dz. No acute bronchospasm. Duo neg q 6 hr while awake. Check alpha-1-antitrypsin phenotype and level Scattered micro nodules bilateral, 0.8 RML nodule, 0.7 cm LUL ground class nodule, will continue surveillance on the out side,check anca, ace level, hypersensitivity pneumonitis 6.   Renal insufficiency resolved, following  Thank you for allowing me to participate in the care of this patient.   Patient/Family are satisfied with care plan and all questions have been answered.  This document was prepared using Dragon voice recognition software and may include unintentional  dictation errors.     Wallene Huh, M.D.  Division of [place]

## 2022-03-12 NOTE — Progress Notes (Signed)
PROGRESS NOTE    Tammy Barrett   GYK:[id number] DOB: 12/07/1960  DOA: 03/06/2022 Date of Service: 03/12/22 PCP: Dion Body, MD     Brief Narrative / Hospital Course:  [name] is a 61 year old female with hypertension, neuropathy, GERD, pulmonary hypertension, scleroderma, COPD, hepatosteatosis, mixed connective tissue disease, long-term use of immunosuppressant medication, pulmonary fibrosis, who presents to the emergency department 03/06/2022 for chief concerns of poor appetite. 08/10: Initial vitals heart rate of 103, blood pressure 123/103, SpO2 of 72% on room air with improvement to 95% on 5 L nasal cannula. BNP was elevated at 1738.4, high sensitive troponin was 40, creatinine 1.24.  No concerns on CT abdomen/pelvis.  CTA chest (+)Nonocclusive saddle pulmonary embolus extending to the left lower lobe segmental and subsegmental levels as well as into the proximal right main pulmonary artery. Associated right heart strain. No pulmonary infarction.  Severe emphysema, multiple pulmonary nodules to follow-up outpatient. Heparin GTT per pharmacy was initiated.  Patient was admitted to hospitalist service stepdown status with vascular consult for saddle PE. EDP had already consulted vascular who recommended heparin bolus and patient to be taken to the OR for thrombectomy in the a.m.  08/11: thrombectomy planned. I was contacted by radiologist Dr. Denna Haggard this morning and due to abnormal appearance of presumed clot and no evidence for DVT as well as significantly dilated RA, radiology is recommending CT chest with contrast to evaluate for potential mediastinal mass as opposed to pulmonary embolus, prior to taking her for thrombectomy.  Confirmed PE.  Also noted 2.3 cm right hilar lymph node, possible inflammatory, recommending follow-up CT with IV contrast 2 to 3 months. Echocardiogram --> LVEF 65 to 70%, normal function, normal diastolic parameters.  Right ventricular systolic  function mildly reduced, moderately increased RV wall thickness, severely elevated pulmonary artery systolic pressure, right atrial size severely dilated.  Underwent thrombolysis/thrombectomy.  08/12: Remains on heparin drip.  HFNC - requiring 10 L/min this morning but improved to 5 L/min later in the day. Pt reports SOB is overall better. Transferred to progressive status.  08/13: still requiring 6 /min O2. D/c heparin gtt 48 hours after thrombectomy and transition Eliquis. RN voiced concern low UOP, continue strict I&O and encouraged po intake  08/14: requiring 8 L/min overnight and maintaining on 5 L/min O2 this morning, no output documented from yesterday. Rales/coarse breath sounds and JVD --> Lasix 60 mg IV x1 and restart home lasix. CXR obtained to futher eval possible underlying pneumonia - images reviewed, stable cardiomegaly and severe background emphysema no new consolidations.  08/15: paged pulmonary Dr [name] 10:53 AM for routine consult. Pt appears fluid overloaded based on LE edema, rales, JVD but given pulm HTN and underlying lung disease will also seek pulmonary recs if any other interventions might be helpful. Continue lasix for now and strict I&O, daily weights.  08/16: still needing 9L/min, confirmed pulmonary to see her today.    Consultants:  Vascular surgery  Procedures: thrombectomy of saddle pulmonary embolus 03/07/2022    Subjective: Patient reports SOB is about same today, LE edema still bothering her but it's better than prior. Reports cough w/ deep breath about the same. Feels comfortable on 8L [place] at the moment.      ASSESSMENT & PLAN:   Principal Problem:   Pulmonary emboli ([name]) Active Problems:   Essential hypertension   Pulmonary fibrosis ([name])   Pulmonary hypertension ([name])   Mixed connective tissue disease ([name])   GERD (gastroesophageal reflux disease)   Acute on  chronic respiratory failure with hypoxia ([name])   Emphysema, spiromety in office is c/w  mild obstruction ([name])   Lymphadenopathy, mediastinal   Pressure injury of skin   (HFpEF) heart failure with preserved ejection fraction ([name])   Protein-calorie malnutrition, severe   Pulmonary emboli ([name]) Vascular surgery following,s/p thrombectomy 03/07/2022 heparin GTT --> po anticoagulation 03/09/22  Echocardiogram --> LVEF 65 to 70%, normal function, normal diastolic parameters.  Right ventricular systolic function mildly reduced, moderately increased RV wall thickness, severely elevated pulmonary artery systolic pressure, right atrial size severely dilated. DVT negative  Acute on chronic respiratory failure with hypoxia ([name]) Secondary to pulmonary emboli Patient at baseline wears oxygen supplementation as needed for emphysema  Continue oxygen supplementation to maintain SpO2 greater than 92% Requiring HFNC at 6-10 L/min, may need to consult pulmonology if unable to wean down Resuming diuresis w/ IV bolus lasix 08/14, then increased home po lasix from daily to bid Pulmonology consulted 03/11/22, consider cardiology consult as well    Mixed connective tissue disease ([place]) status post Plaquenil, prednisone, Imuran Currently on mycophenolate 500 mg p.o. twice daily, resumed  Pulmonary fibrosis ([place]) Pulmonary hypertension Pulmonology consulted 03/11/22  Resumed home tadalafil 20 mg daily  Lymphadenopathy, mediastinal 2.3 cm right hilar lymph node, possible inflammatory, noted on CT chest 03/07/22  Outpatient follow-up CT with IV contrast 2 to 3 months  Emphysema, spiromety in office is c/w mild obstruction ([place]) Extensive lung disease, see above for pulmonary fibrosis No apparent COPD exacerbation No home inhalers Not compliant w/ home O2 Pulmonology consulted 03/11/22 DuoNeb prn   Pulmonary hypertension ([name]) RVC systolic function reduced and dilated RA. Echo as above Concerning for CHF/HFpEF on exam but findings have been stable past few days  Lasix 60 mg IV x1  given 08/14 CXR given cough to eval for pneumonia - no infectious process, no significant edema Pulmonology consulted 03/11/22  Continue Lasix 40 mg po bid, strict I&O  (HFpEF) heart failure with preserved ejection fraction ([place]) 03/10/22 evidenced by rales, LE edema, JVD 08/11 Echocardiogram --> LVEF 65 to 70%, normal function, normal diastolic parameters.  Right ventricular systolic function mildly reduced, moderately increased RV wall thickness, severely elevated pulmonary artery systolic pressure, right atrial size severely dilated. diuresis today w/ IV bolus lasix then resume home po lasix if improved  CXR no significant pulmonary edema Pulmonology consulted 03/11/22, would strongly consider cardiology consult as well      DVT prophylaxis: Eliquis  Code Status: Full code Family Communication: none at this time  Disposition Plan / TOC needs: SNF  Barriers to discharge / significant pending items: anticipate discharge in the next 2-3 days pending vascular surgery clearance / improvement in oxygenation. Given underlying lung disease obtained pulmonary consult, consider cardiology consult as well              Objective: Vitals:   03/12/22 0439 03/12/22 0823 03/12/22 1038 03/12/22 1157  BP: 118/89 130/81 (!) 164/99 112/78  Pulse: 95 91 82 98  Resp: _0 Temp: 98.1 F (36.7 C) 97.9 F (36.6 C)  98.2 F (36.8 C)  TempSrc:  Oral  Oral  SpO2: 99% 94% 96% 90%  Weight:      Height:        Intake/Output Summary (Last 24 hours) at 03/12/2022 1327 Last data filed at 03/12/2022 1207 Gross per 24 hour  Intake 1318 ml  Output --  Net 1318 ml   Filed Weights   03/06/22 1700 03/07/22 1216 03/10/22 1743  Weight: 64.6 kg 63.5 kg 68.4 kg    Examination:  Constitutional:  VS as above General Appearance: alert, well-developed, well-nourished, NAD Neck: No masses, trachea midline Respiratory: Normal respiratory effort No wheeze (+) rales/coarse sounds more at  bases, similar to yesterday Cardiovascular: S1/S2 normal +1 lower extremity edema - stable from previous (+)JVD Gastrointestinal: No tenderness Musculoskeletal:  No clubbing/cyanosis of digits Symmetrical movement in all extremities Neurological: No cranial nerve deficit on limited exam Alert Psychiatric: Normal judgment/insight Normal mood and affect       Scheduled Medications:   amLODipine  10 mg Oral Daily   apixaban  10 mg Oral BID   Followed by   Derrill Memo ON 03/16/2022] apixaban  5 mg Oral BID   Chlorhexidine Gluconate Cloth  6 each Topical Q0600   feeding supplement  237 mL Oral TID BM   furosemide  40 mg Oral BID   gabapentin  600 mg Oral TID   multivitamin with minerals  1 tablet Oral Daily   mycophenolate  500 mg Oral BID   pantoprazole  40 mg Oral Daily   tadalafil (PAH)  20 mg Oral Daily    Continuous Infusions:    PRN Medications:  fluticasone, hydrocortisone cream, HYDROmorphone (DILAUDID) injection, ipratropium-albuterol, mouth rinse, senna-docusate  Antimicrobials:  Anti-infectives (From admission, onward)    Start     Dose/Rate Route Frequency Ordered Stop   03/07/22 1432  ceFAZolin (ANCEF) IVPB 1 g/50 mL premix  Status:  Discontinued        over 30 Minutes  Continuous PRN 03/07/22 1439 03/07/22 1536   03/07/22 0804  ceFAZolin (ANCEF) IVPB 2g/100 mL premix  Status:  Discontinued        2 g 200 mL/hr over 30 Minutes Intravenous 30 min pre-op 03/07/22 0804 03/07/22 1536       Data Reviewed: I have personally reviewed following labs and imaging studies  CBC: Recent Labs  Lab 03/06/22 1340 03/07/22 0558 03/08/22 0350 03/09/22 0530 03/10/22 0308  WBC 5.4 6.2 6.3 5.8 5.8  NEUTROABS 3.9  --   --   --   --   HGB 18.3* 16.7* 14.4 14.6 13.5  HCT 55.6* 50.7* 45.5 45.9 41.8  MCV 95.5 95.1 98.3 99.4 96.3  PLT 168 167 145* 104* 357*   Basic Metabolic Panel: Recent Labs  Lab 03/07/22 0558 03/08/22 0350 03/09/22 0530 03/11/22 0441  03/12/22 0523  NA 138 136 135 133* 136  K 4.1 4.2 4.2 4.4 4.5  CL 109 109 107 105 105  CO2 _0 GLUCOSE 93 131* 118* 92 105*  BUN 24* 25* 27* 30* 29*  CREATININE 1.14* 1.15* 1.11* 0.94 0.96  CALCIUM 8.4* 7.8* 7.9* 8.0* 8.1*   GFR: Estimated Creatinine Clearance: 60.6 mL/min (by C-G formula based on SCr of 0.96 mg/dL). Liver Function Tests: Recent Labs  Lab 03/06/22 1218  AST 40  ALT 14  ALKPHOS 424*  BILITOT 2.7*  PROT 7.9  ALBUMIN 3.2*   Recent Labs  Lab 03/06/22 1341  LIPASE 26   No results for input(s): "AMMONIA" in the last 168 hours. Coagulation Profile: Recent Labs  Lab 03/06/22 1756  INR 1.7*   Cardiac Enzymes: No results for input(s): "CKTOTAL", "CKMB", "CKMBINDEX", "TROPONINI" in the last 168 hours. BNP (last 3 results) No results for input(s): "PROBNP" in the last 8760 hours. HbA1C: No results for input(s): "HGBA1C" in the last 72 hours. CBG: Recent Labs  Lab 03/08/22 1201 03/08/22 1631  GLUCAP 135* 126*  Lipid Profile: No results for input(s): "CHOL", "HDL", "LDLCALC", "TRIG", "CHOLHDL", "LDLDIRECT" in the last 72 hours. Thyroid Function Tests: No results for input(s): "TSH", "T4TOTAL", "FREET4", "T3FREE", "THYROIDAB" in the last 72 hours. Anemia Panel: No results for input(s): "VITAMINB12", "FOLATE", "FERRITIN", "TIBC", "IRON", "RETICCTPCT" in the last 72 hours. Urine analysis: No results found for: "COLORURINE", "APPEARANCEUR", "LABSPEC", "PHURINE", "GLUCOSEU", "HGBUR", "BILIRUBINUR", "KETONESUR", "PROTEINUR", "UROBILINOGEN", "NITRITE", "LEUKOCYTESUR" Sepsis Labs: _0 (procalcitonin:4,lacticidven:4)  Recent Results (from the past 240 hour(s))  MRSA Next Gen by PCR, Nasal     Status: None   Collection Time: 03/06/22  5:07 PM   Specimen: Nasal Mucosa; Nasal Swab  Result Value Ref Range Status   MRSA by PCR Next Gen NOT DETECTED NOT DETECTED Final    Comment: (NOTE) The GeneXpert MRSA Assay (FDA approved for NASAL  specimens only), is one component of a comprehensive MRSA colonization surveillance program. It is not intended to diagnose MRSA infection nor to guide or monitor treatment for MRSA infections. Test performance is not FDA approved in patients less than 84 years old. Performed at [facility], [address] [place], [place] [address]          Radiology Studies: US Venous Img Lower Bilateral (DVT)  Result Date: 03/07/2022 CLINICAL DATA:  Patient with pulmonary embolism.  Evaluate for DVT. EXAM: BILATERAL LOWER EXTREMITY VENOUS DOPPLER ULTRASOUND TECHNIQUE: Gray-scale sonography with graded compression, as well as color Doppler and duplex ultrasound were performed to evaluate the lower extremity deep venous systems from the level of the common femoral vein and including the common femoral, femoral, profunda femoral, popliteal and calf veins including the posterior tibial, peroneal and gastrocnemius veins when visible. The superficial great saphenous vein was also interrogated. Spectral Doppler was utilized to evaluate flow at rest and with distal augmentation maneuvers in the common femoral, femoral and popliteal veins. COMPARISON:  None Available. FINDINGS: RIGHT LOWER EXTREMITY Common Femoral Vein: No evidence of thrombus. Normal compressibility, respiratory phasicity and response to augmentation. Saphenofemoral Junction: No evidence of thrombus. Normal compressibility and flow on color Doppler imaging. Profunda Femoral Vein: No evidence of thrombus. Normal compressibility and flow on color Doppler imaging. Femoral Vein: No evidence of thrombus. Normal compressibility, respiratory phasicity and response to augmentation. Popliteal Vein: No evidence of thrombus. Normal compressibility, respiratory phasicity and response to augmentation. Calf Veins: No evidence of thrombus. Normal compressibility and flow on color Doppler imaging. Other Findings:  None. LEFT LOWER EXTREMITY Common Femoral  Vein: No evidence of thrombus. Normal compressibility, respiratory phasicity and response to augmentation. Saphenofemoral Junction: No evidence of thrombus. Normal compressibility and flow on color Doppler imaging. Profunda Femoral Vein: No evidence of thrombus. Normal compressibility and flow on color Doppler imaging. Femoral Vein: No evidence of thrombus. Normal compressibility, respiratory phasicity and response to augmentation. Popliteal Vein: No evidence of thrombus. Normal compressibility, respiratory phasicity and response to augmentation. Calf Veins: No evidence of thrombus. Normal compressibility and flow on color Doppler imaging. Other Findings:  None. IMPRESSION: No evidence of deep venous thrombosis in either lower extremity. Electronically Signed   By: Markus Daft M.D.   On: 03/07/2022 07:29            LOS: 6 days      Emeterio Reeve, DO [facility] 03/12/2022, 1:27 PM   Staff may message me via secure chat in [place]  but this may not receive immediate response,  please page for urgent matters!  If 7PM-7AM, please contact night-coverage [email or web address]  Dictation software was used to generate  the above note. Typos may occur and escape review, as with typed/written notes. Please contact Dr Sheppard Coil directly for clarity if needed.

## 2022-03-13 DIAGNOSIS — I5032 Chronic diastolic (congestive) heart failure: Secondary | ICD-10-CM | POA: Diagnosis not present

## 2022-03-13 DIAGNOSIS — Z515 Encounter for palliative care: Secondary | ICD-10-CM

## 2022-03-13 DIAGNOSIS — J439 Emphysema, unspecified: Secondary | ICD-10-CM | POA: Diagnosis not present

## 2022-03-13 DIAGNOSIS — I2602 Saddle embolus of pulmonary artery with acute cor pulmonale: Secondary | ICD-10-CM | POA: Diagnosis not present

## 2022-03-13 DIAGNOSIS — J9621 Acute and chronic respiratory failure with hypoxia: Secondary | ICD-10-CM | POA: Diagnosis not present

## 2022-03-13 DIAGNOSIS — I10 Essential (primary) hypertension: Secondary | ICD-10-CM | POA: Diagnosis not present

## 2022-03-13 LAB — HEPATIC FUNCTION PANEL
ALT: 13 U/L (ref 0–44)
AST: 27 U/L (ref 15–41)
Albumin: 2.3 g/dL — ABNORMAL LOW (ref 3.5–5.0)
Alkaline Phosphatase: 309 U/L — ABNORMAL HIGH (ref 38–126)
Bilirubin, Direct: 0.3 mg/dL — ABNORMAL HIGH (ref 0.0–0.2)
Indirect Bilirubin: 0.4 mg/dL (ref 0.3–0.9)
Total Bilirubin: 0.7 mg/dL (ref 0.3–1.2)
Total Protein: 6.4 g/dL — ABNORMAL LOW (ref 6.5–8.1)

## 2022-03-13 LAB — CBC
HCT: 41.2 % (ref 36.0–46.0)
Hemoglobin: 13.8 g/dL (ref 12.0–15.0)
MCH: 31.4 pg (ref 26.0–34.0)
MCHC: 33.5 g/dL (ref 30.0–36.0)
MCV: 93.8 fL (ref 80.0–100.0)
Platelets: 216 10*3/uL (ref 150–400)
RBC: 4.39 MIL/uL (ref 3.87–5.11)
RDW: 17.2 % — ABNORMAL HIGH (ref 11.5–15.5)
WBC: 4.2 10*3/uL (ref 4.0–10.5)
nRBC: 0.7 % — ABNORMAL HIGH (ref 0.0–0.2)

## 2022-03-13 LAB — BASIC METABOLIC PANEL
Anion gap: 7 (ref 5–15)
BUN: 28 mg/dL — ABNORMAL HIGH (ref 6–20)
CO2: 25 mmol/L (ref 22–32)
Calcium: 8.2 mg/dL — ABNORMAL LOW (ref 8.9–10.3)
Chloride: 102 mmol/L (ref 98–111)
Creatinine, Ser: 0.82 mg/dL (ref 0.44–1.00)
GFR, Estimated: >60 mL/min (ref >60)
Glucose, Bld: 186 mg/dL — ABNORMAL HIGH (ref 70–99)
Potassium: 4.6 mmol/L (ref 3.5–5.1)
Sodium: 134 mmol/L — ABNORMAL LOW (ref 135–145)

## 2022-03-13 LAB — HCG, QUANTITATIVE, PREGNANCY: hCG, Beta Chain, Quant, S: 1 m[IU]/mL (ref <5)

## 2022-03-13 NOTE — Progress Notes (Addendum)
Pulmonary Medicine          Date: 03/13/2022,   MRN# [id number] Tammy Barrett Dec 12, 1960      HISTORY OF PRESENT ILLNESS   This is a 61 yr old lady, came in to [facility] c/o increase dyspnea, tachycardia, past hx of ctd, pulmonary fibrosis, chronic cough, pulmonary hypertension, copd with bullae disease. CTA showed:  st (+)Nonocclusive saddle pulmonary embolus extending to the left lower lobe segmental and subsegmental levels as well as into the proximal right main pulmonary artery. Associated right heart strain. No pulmonary infarction.  Severe emphysema, multiple pulmonary nodules. S/p mechanical thrombolectomy.    Remained overnight on 8 liters fio2, last seen in the office  she was on 3 liters. Cough is better, peripheral edema the same. No clots in legs.    She was having lunch. Comfortable at rest but easily become dyspneic with activity  We discussed the need to consider lung transplant. She is willing to do so. She understands prognosis is guarded.  PAST MEDICAL HISTORY   Past Medical History:  Diagnosis Date   COPD (chronic obstructive pulmonary disease) ([name])    Discoid lupus    Eczema    Esophageal dysmotility    Genital herpes    GERD (gastroesophageal reflux disease)    HSV (herpes simplex virus) infection    Hypertension    Lumbago    Lumbar disc disease    Peripheral neuropathy    Pulmonary fibrosis ([place])    Pulmonary hypertension ([name])    Raynaud's disease    Sclerodactyly    Scleroderma ([place])    Spinal stenosis      SURGICAL HISTORY   Past Surgical History:  Procedure Laterality Date   bone removal from pinkie toe     COLONOSCOPY WITH PROPOFOL N/A 08/30/2021   Procedure: COLONOSCOPY WITH PROPOFOL;  Surgeon: [name], MD;  Location: [facility] ENDOSCOPY;  Service: Gastroenterology;  Laterality: N/A;   DILATION AND CURETTAGE OF UTERUS     PULMONARY THROMBECTOMY N/A 03/07/2022   Procedure: PULMONARY THROMBECTOMY;  Surgeon: Algernon Huxley, MD;   Location: [place] CV LAB;  Service: Cardiovascular;  Laterality: N/A;   RIGHT HEART CATH Right 09/17/2018   Procedure: RIGHT HEART CATH;  Surgeon: Teodoro Spray, MD;  Location: [place] CV LAB;  Service: Cardiovascular;  Laterality: Right;   SPLENECTOMY     TUBAL LIGATION       FAMILY HISTORY   Family History  Problem Relation Age of Onset   Uterine cancer Mother    Stroke Father    Breast cancer Neg Hx      SOCIAL HISTORY   Social History   Tobacco Use   Smoking status: Former    Packs/day: 0.50    Types: Cigarettes    Quit date: 01/08/2011    Years since quitting: 11.1   Smokeless tobacco: Never  Vaping Use   Vaping Use: Never used  Substance Use Topics   Alcohol use: No   Drug use: No     MEDICATIONS    Home Medication:    Current Medication:  Current Facility-Administered Medications:    ambrisentan (LETAIRIS) tablet 5 mg, 5 mg, Oral, Daily, Maitland Muhlbauer E, MD   amLODipine (NORVASC) tablet 10 mg, 10 mg, Oral, Daily, Dew, Erskine Squibb, MD, 10 mg at 03/13/22 0901   apixaban (ELIQUIS) tablet 10 mg, 10 mg, Oral, BID, 10 mg at 03/13/22 0901 **FOLLOWED BY** [START ON 03/16/2022] apixaban (ELIQUIS) tablet 5 mg, 5 mg,  Oral, BID, [name], [facility]   Chlorhexidine Gluconate Cloth 2 % PADS 6 each, 6 each, Topical, Q0600, Algernon Huxley, MD, 6 each at 03/13/22 0654   feeding supplement (ENSURE ENLIVE / ENSURE PLUS) liquid 237 mL, 237 mL, Oral, TID BM, Emeterio Reeve, DO, 237 mL at 03/13/22 0902   fluticasone (FLONASE) 50 MCG/ACT nasal spray 1 spray, 1 spray, Each Nare, Daily PRN, Algernon Huxley, MD, 1 spray at 03/12/22 2153   furosemide (LASIX) tablet 40 mg, 40 mg, Oral, BID, Emeterio Reeve, DO, 40 mg at 03/13/22 0901   gabapentin (NEURONTIN) tablet 600 mg, 600 mg, Oral, TID, Lucky Cowboy, Erskine Squibb, MD, 600 mg at 03/13/22 1539   hydrocortisone cream 1 %, , Topical, QID PRN, Emeterio Reeve, DO   HYDROmorphone (DILAUDID) injection 1 mg, 1 mg, Intravenous, Once  PRN, Lucky Cowboy, Erskine Squibb, MD   ipratropium-albuterol (DUONEB) 0.5-2.5 (3) MG/3ML nebulizer solution 3 mL, 3 mL, Nebulization, Q4H PRN, Emeterio Reeve, DO   methylPREDNISolone sodium succinate (SOLU-MEDROL) 125 mg/2 mL injection 80 mg, 80 mg, Intravenous, Q24H, [name], Jaecob Lowden E, MD, 80 mg at 03/12/22 2009   multivitamin with minerals tablet 1 tablet, 1 tablet, Oral, Daily, Emeterio Reeve, DO, 1 tablet at 03/13/22 0901   mycophenolate (CELLCEPT) capsule 500 mg, 500 mg, Oral, BID, Dew, Erskine Squibb, MD, 500 mg at 03/13/22 4481   Oral care mouth rinse, 15 mL, Mouth Rinse, PRN, Sharion Settler, NP   pantoprazole (PROTONIX) EC tablet 40 mg, 40 mg, Oral, Daily, Dew, Erskine Squibb, MD, 40 mg at 03/13/22 0901   senna-docusate (Senokot-S) tablet 1 tablet, 1 tablet, Oral, QHS PRN, Algernon Huxley, MD, 1 tablet at 03/09/22 1347   tadalafil (PAH) (ADCIRCA) tablet 20 mg, 20 mg, Oral, Daily, Dew, Erskine Squibb, MD, 20 mg at 03/13/22 8563    ALLERGIES   Hydroxychloroquine and Meclizine     REVIEW OF SYSTEMS    Review of Systems:  Gen:  Denies  fever, sweats, chills weigh loss  HEENT: Denies blurred vision, double vision, ear pain, eye pain, hearing loss, nose bleeds, sore throat Cardiac:  No dizziness, chest pain or heaviness, chest tightness,edema Resp:   + less cough or sputum porduction, ++ shortness of breath, no wheezing, hemoptysis,  Gi: Denies swallowing difficulty, stomach pain, nausea or vomiting, diarrhea, constipation, bowel incontinence Gu:  Denies bladder incontinence, burning urine Ext:   Denies Joint pain, stiffness  ++ swelling Skin: Denies  skin rash, easy bruising or bleeding or hives Endoc:  Denies polyuria, polydipsia , polyphagia or weight change Psych:   Denies depression, insomnia or hallucinations   Other:  All other systems negative   VS: BP 115/83 (BP Location: Right Arm)   Pulse (!) 107   Temp 98.4 F (36.9 C) (Oral)   Resp 16   Ht _0  (1.651 m)   Wt 68.4 kg   SpO2 90%   BMI  25.11 kg/m      PHYSICAL EXAM    GENERAL:NAD, no fevers, chills, no weakness no fatigue HEAD: Normocephalic, atraumatic.  EYES: Pupils equal, round, reactive to light. Extraocular muscles intact. No scleral icterus.  MOUTH: Moist mucosal membrane. Dentition intact. No abscess noted.  EAR, NOSE, THROAT: Clear without exudates. No external lesions.  NECK: Supple. No thyromegaly. No nodules. No JVD.  PULMONARY: bilateral crackles CARDIOVASCULAR: S1 and S2. Regular rate and rhythm. No murmurs, rubs, or gallops. ++ edema. Pedal pulses 2+ bilaterally.  GASTROINTESTINAL: Soft, nontender, nondistended. No masses. Positive bowel sounds. No hepatosplenomegaly.  MUSCULOSKELETAL: No swelling, clubbing, or edema. Range of motion full in all extremities.  NEUROLOGIC: Cranial nerves II through XII are intact. No gross focal neurological deficits. Sensation intact. Reflexes intact.  SKIN: No ulceration, lesions, rashes, or cyanosis. Skin warm and dry. Turgor intact.  PSYCHIATRIC: Mood, affect within normal limits. The patient is awake, alert and oriented x 3. Insight, judgment intact.       IMAGING    DG Chest Port 1 View  Result Date: 03/10/2022 CLINICAL DATA:  Leg swelling EXAM: PORTABLE CHEST 1 VIEW COMPARISON:  Chest x-ray dated March 06, 2022; chest CT dated March 07, 2022 FINDINGS: Unchanged cardiomegaly. Severe background emphysema with areas of nodularity, better evaluated on recent chest CT. No new focal consolidation. The visualized skeletal structures are unremarkable. IMPRESSION: Severe emphysema with no new focal consolidation. Electronically Signed   By: Yetta Glassman M.D.   On: 03/10/2022 10:25   PERIPHERAL VASCULAR CATHETERIZATION  Result Date: 03/07/2022 See surgical note for result.  ECHOCARDIOGRAM COMPLETE  Result Date: 03/07/2022    ECHOCARDIOGRAM REPORT   Patient Name:   CHANLER SCHREITER Date of Exam: 03/07/2022 Medical Rec #:  [id number]        Height:       65.0 in  Accession #:    [id number]       Weight:       142.4 lb Date of Birth:  Feb 20, 1961        BSA:          1.712 m Patient Age:    15 years         BP:           118/100 mmHg Patient Gender: F                HR:           95 bpm. Exam Location:  [facility] Procedure: 2D Echo, Cardiac Doppler and Color Doppler Indications:     Pulmonary Embolus I26.09  History:         Patient has no prior history of Echocardiogram examinations.                  COPD and Pulmonary HTN; Risk Factors:Hypertension.  Sonographer:     Sherrie Sport Referring Phys:  [id number] AMY N COX Diagnosing Phys: Serafina Royals MD IMPRESSIONS  1. Left ventricular ejection fraction, by estimation, is 65 to 70%. The left ventricle has normal function. The left ventricle has no regional wall motion abnormalities. Left ventricular diastolic parameters were normal.  2. Right ventricular systolic function is mildly reduced. The right ventricular size is severely enlarged. Moderately increased right ventricular wall thickness. There is severely elevated pulmonary artery systolic pressure.  3. Right atrial size was severely dilated.  4. The mitral valve is normal in structure. Trivial mitral valve regurgitation.  5. Tricuspid valve regurgitation is severe.  6. The aortic valve is normal in structure. Aortic valve regurgitation is trivial. FINDINGS  Left Ventricle: Left ventricular ejection fraction, by estimation, is 65 to 70%. The left ventricle has normal function. The left ventricle has no regional wall motion abnormalities. The left ventricular internal cavity size was small. There is no left ventricular hypertrophy. Left ventricular diastolic parameters were normal. Right Ventricle: The right ventricular size is severely enlarged. Moderately increased right ventricular wall thickness. Right ventricular systolic function is mildly reduced. There is severely elevated pulmonary artery systolic pressure. Left Atrium: Left atrial size was normal in size. Right Atrium:  Right atrial  size was severely dilated. Pericardium: There is no evidence of pericardial effusion. Mitral Valve: The mitral valve is normal in structure. Trivial mitral valve regurgitation. Tricuspid Valve: The tricuspid valve is normal in structure. Tricuspid valve regurgitation is severe. Aortic Valve: The aortic valve is normal in structure. Aortic valve regurgitation is trivial. Aortic valve mean gradient measures 2.0 mmHg. Aortic valve peak gradient measures 2.8 mmHg. Aortic valve area, by VTI measures 2.37 cm. Pulmonic Valve: The pulmonic valve was normal in structure. Pulmonic valve regurgitation is mild. Aorta: The aortic root and ascending aorta are structurally normal, with no evidence of dilitation. IAS/Shunts: No atrial level shunt detected by color flow Doppler.  LEFT VENTRICLE PLAX 2D LVIDd:         2.60 cm     Diastology LVIDs:         2.00 cm     LV e' medial:    5.87 cm/s LV PW:         1.20 cm     LV E/e' medial:  6.0 LV IVS:        1.20 cm     LV e' lateral:   11.30 cm/s LVOT diam:     2.00 cm     LV E/e' lateral: 3.1 LV SV:         25 LV SV Index:   15 LVOT Area:     3.14 cm  LV Volumes (MOD) LV vol d, MOD A4C: 17.0 ml LV vol s, MOD A4C: 10.5 ml LV SV MOD A4C:     17.0 ml RIGHT VENTRICLE RV Basal diam:  4.30 cm RV S prime:     7.72 cm/s TAPSE (M-mode): 1.6 cm LEFT ATRIUM             Index        RIGHT ATRIUM           Index LA diam:        3.40 cm 1.99 cm/m   RA Area:     42.90 cm LA Vol (A2C):   67.6 ml 39.48 ml/m  RA Volume:   218.00 ml 127.31 ml/m LA Vol (A4C):   17.7 ml 10.34 ml/m LA Biplane Vol: 35.7 ml 20.85 ml/m  AORTIC VALVE AV Area (Vmax):    2.32 cm AV Area (Vmean):   2.02 cm AV Area (VTI):     2.37 cm AV Vmax:           83.20 cm/s AV Vmean:          59.100 cm/s AV VTI:            0.106 m AV Peak Grad:      2.8 mmHg AV Mean Grad:      2.0 mmHg LVOT Vmax:         61.40 cm/s LVOT Vmean:        38.000 cm/s LVOT VTI:          0.080 m LVOT/AV VTI ratio: 0.75  AORTA Ao Root diam:  3.15 cm MITRAL VALVE               TRICUSPID VALVE MV Area (PHT): 8.92 cm    TR Peak grad:   74.0 mmHg MV Decel Time: 85 msec     TR Vmax:        430.00 cm/s MV E velocity: 35.10 cm/s MV A velocity: 74.60 cm/s  SHUNTS MV E/A ratio:  0.47        Systemic VTI:  0.08 m                            Systemic Diam: 2.00 cm Serafina Royals MD Electronically signed by Serafina Royals MD Signature Date/Time: 03/07/2022/12:58:25 PM    Final    CT CHEST W CONTRAST  Result Date: 03/07/2022 CLINICAL DATA:  Soft tissue mass. EXAM: CT CHEST WITH CONTRAST TECHNIQUE: Multidetector CT imaging of the chest was performed during intravenous contrast administration. RADIATION DOSE REDUCTION: This exam was performed according to the departmental dose-optimization program which includes automated exposure control, adjustment of the mA and/or kV according to patient size and/or use of iterative reconstruction technique. CONTRAST:  1m OMNIPAQUE IOHEXOL 300 MG/ML  SOLN COMPARISON:  CTA of the chest of March 06, 2022. FINDINGS: Cardiovascular: As noted on the exam performed yesterday, there is again noted large saddle embolus extending from proximal right pulmonary artery and through left pulmonary artery into the left lower lobe branches. This is not significantly changed compared to yesterday. Atherosclerosis of thoracic aorta is noted without aneurysm or dissection. No pericardial effusion is noted. Mild cardiomegaly is noted. Mediastinum/Nodes: Thyroid gland is unremarkable. The esophagus appears normal. 2.3 cm right hilar lymph node is noted. Lungs/Pleura: No pneumothorax or pleural effusion is noted. Emphysematous disease and scarring is noted throughout both lungs. Stable 5 mm nodule is noted in right lower lobe best seen on image number 73 of series 3. Stable 3 mm nodule is noted in left upper lobe best seen on image number 39 of series 3. Stable 3 mm nodules also seen in left upper lobe best seen on image number 49 of series 3.  Upper Abdomen: No acute abnormality. Musculoskeletal: No chest wall abnormality. No acute or significant osseous findings. IMPRESSION: There is stable large pulmonary embolus extending primarily through the left pulmonary artery and into the left lower lobe branches as described on prior exam. 2.3 cm right hilar lymph node is noted which may be inflammatory in etiology, but neoplasm cannot be excluded. Follow-up CT scan with intravenous contrast is recommended in 2-3 months to evaluate stability. Extensive emphysematous disease and scarring is noted throughout both lungs as described on prior exam. Multiple pulmonary nodules are again noted as described on prior exam performed yesterday, with the largest measuring 5 mm in the right lower lobe. No follow-up needed if patient is low-risk (and has no known or suspected primary neoplasm). Non-contrast chest CT can be considered in 12 months if patient is high-risk. This recommendation follows the consensus statement: Guidelines for Management of Incidental Pulmonary Nodules Detected on CT Images: From the Fleischner Society 2017; Radiology 2017; 284:228-243. Aortic Atherosclerosis (ICD10-I70.0) and Emphysema (ICD10-J43.9). Electronically Signed   By: JMarijo ConceptionM.D.   On: 03/07/2022 11:14   UKoreaVenous Img Lower Bilateral (DVT)  Result Date: 03/07/2022 CLINICAL DATA:  Patient with pulmonary embolism.  Evaluate for DVT. EXAM: BILATERAL LOWER EXTREMITY VENOUS DOPPLER ULTRASOUND TECHNIQUE: Gray-scale sonography with graded compression, as well as color Doppler and duplex ultrasound were performed to evaluate the lower extremity deep venous systems from the level of the common femoral vein and including the common femoral, femoral, profunda femoral, popliteal and calf veins including the posterior tibial, peroneal and gastrocnemius veins when visible. The superficial great saphenous vein was also interrogated. Spectral Doppler was utilized to evaluate flow at rest  and with distal augmentation maneuvers in the common femoral, femoral and popliteal veins. COMPARISON:  None Available. FINDINGS: RIGHT LOWER  EXTREMITY Common Femoral Vein: No evidence of thrombus. Normal compressibility, respiratory phasicity and response to augmentation. Saphenofemoral Junction: No evidence of thrombus. Normal compressibility and flow on color Doppler imaging. Profunda Femoral Vein: No evidence of thrombus. Normal compressibility and flow on color Doppler imaging. Femoral Vein: No evidence of thrombus. Normal compressibility, respiratory phasicity and response to augmentation. Popliteal Vein: No evidence of thrombus. Normal compressibility, respiratory phasicity and response to augmentation. Calf Veins: No evidence of thrombus. Normal compressibility and flow on color Doppler imaging. Other Findings:  None. LEFT LOWER EXTREMITY Common Femoral Vein: No evidence of thrombus. Normal compressibility, respiratory phasicity and response to augmentation. Saphenofemoral Junction: No evidence of thrombus. Normal compressibility and flow on color Doppler imaging. Profunda Femoral Vein: No evidence of thrombus. Normal compressibility and flow on color Doppler imaging. Femoral Vein: No evidence of thrombus. Normal compressibility, respiratory phasicity and response to augmentation. Popliteal Vein: No evidence of thrombus. Normal compressibility, respiratory phasicity and response to augmentation. Calf Veins: No evidence of thrombus. Normal compressibility and flow on color Doppler imaging. Other Findings:  None. IMPRESSION: No evidence of deep venous thrombosis in either lower extremity. Electronically Signed   By: Markus Daft M.D.   On: 03/07/2022 07:29   CT Angio Chest PE W/Cm &/Or Wo Cm  Result Date: 03/06/2022 CLINICAL DATA:  CT Abdomen Pelvis W Contrast suspected, high prob Abdominal pain, acute, nonlocalized EXAM: CT ANGIOGRAPHY CHEST WITH CONTRAST TECHNIQUE: Multidetector CT imaging of the chest was  performed using the standard protocol during bolus administration of intravenous contrast. Multiplanar CT image reconstructions and MIPs were obtained to evaluate the vascular anatomy. RADIATION DOSE REDUCTION: This exam was performed according to the departmental dose-optimization program which includes automated exposure control, adjustment of the mA and/or kV according to patient size and/or use of iterative reconstruction technique. CONTRAST:  65m OMNIPAQUE IOHEXOL 350 MG/ML SOLN COMPARISON:  CT chest 03/05/2020 FINDINGS: Cardiovascular: Satisfactory opacification of the pulmonary arteries to the segmental level. Nonocclusive saddle embolus extending to the left lower lobe segmental and subsegmental levels as well as into the proximal right main pulmonary artery. Enlarged right heart with marked enlargement of the right atria. There is increased right to left ventricular ratio of at least 1.5. Retrograde reflux of intravenous contrast into a dilated inferior vena cava and hepatic veins. The main pulmonary artery is enlarged measuring up to 3.7 cm. No significant pericardial effusion. The thoracic aorta is normal in caliber. No atherosclerotic plaque of the thoracic aorta. No coronary artery calcifications. Mediastinum/Nodes: No enlarged mediastinal, hilar, or axillary lymph nodes. Thyroid gland, trachea, and esophagus demonstrate no significant findings. Lungs/Pleura: Severe centrilobular and paraseptal emphysematous changes. Scattered areas of reticulations, scarring, and bronchiectasis. No focal consolidation. 0.8 x 0.4 cm right middle lobe pulmonary nodule. Adjacent 0.7 x 0.6 cm ground-glass subpleural nodule (6:49). 0.5 x 0.5 cm left upper lobe pulmonary nodule (6:33). Left upper lobe pulmonary micronodule (6:27). Several other scattered pulmonary micronodules. No pulmonary mass. No pleural effusion. No pneumothorax. Upper Abdomen: Please see separately dictated CT abdomen pelvis 03/06/2022 Musculoskeletal:  Subcutaneus soft tissue edema. No suspicious lytic or blastic osseous lesions. No acute displaced fracture. Multilevel degenerative changes of the spine. Chronic appearing minimal vertebral body height loss of the L4-L5 level. Review of the MIP images confirms the above findings. IMPRESSION: 1. Nonocclusive saddle pulmonary embolus extending to the left lower lobe segmental and subsegmental levels as well as into the proximal right main pulmonary artery. Associated right heart strain. No pulmonary infarction. 2.  Emphysema (ICD10-J43.9) -  severe. 3. Scattered pulmonary micronodules as well as a 0.8 x 0.4 cm right middle lobe pulmonary nodule and adjacent 0.7 x 0.6 cm ground-glass subpleural nodule. Initial follow-up with CT at 6 months is recommended to confirm persistence. If persistent, repeat CT is recommended every 2 years until 5 years of stability has been established. This recommendation follows the consensus statement: Guidelines for Management of Incidental Pulmonary Nodules Detected on CT Images: From the Fleischner Society 2017; Radiology 2017; 284:228-243. These results were called by telephone at the time of interpretation on 03/06/2022 at 3:21 pm to provider [facility] , who verbally acknowledged these results. Electronically Signed   By: Iven Finn M.D.   On: 03/06/2022 15:32   CT Abdomen Pelvis W Contrast  Result Date: 03/06/2022 CLINICAL DATA:  Abdominal pain EXAM: CT ABDOMEN AND PELVIS WITH CONTRAST TECHNIQUE: Multidetector CT imaging of the abdomen and pelvis was performed using the standard protocol following bolus administration of intravenous contrast. RADIATION DOSE REDUCTION: This exam was performed according to the departmental dose-optimization program which includes automated exposure control, adjustment of the mA and/or kV according to patient size and/or use of iterative reconstruction technique. CONTRAST:  41m OMNIPAQUE IOHEXOL 350 MG/ML SOLN COMPARISON:  None Available.  FINDINGS: Lower chest: Centrilobular and panlobular emphysema is seen in the lower lung fields. Prominence of interstitial markings in the lower lung fields suggest scarring. Heart is enlarged in size. Hepatobiliary: Liver measures 18.3 cm in length. There is fatty infiltration. There is no dilation of bile ducts. Small amount of fluid adjacent to the gallbladder may be part of ascites. There is no significant wall thickening in gallbladder. Gallbladder is not distended. Pancreas: No focal abnormalities are seen. Spleen: Spleen is not seen. Surgical clips on the left hemidiaphragms suggest possible previous splenectomy. Adrenals/Urinary Tract: There is mild hyperplasia of left adrenal. There is no hydronephrosis. There are no renal or ureteral stones. Urinary bladder is not distended. Stomach/Bowel: Stomach is not distended. Small bowel loops are not dilated. Appendix is not seen. There is no significant wall thickening in colon. Vascular/Lymphatic: Unremarkable. Reproductive: Unremarkable. Other: Small ascites is present. There is no pneumoperitoneum. Left inguinal hernia containing fat is seen. There is subcutaneous edema suggesting anasarca. Musculoskeletal: Slight decrease in height of upper end plate of body of L4 vertebra may be residual from previous injury. Degenerative changes are noted in the lumbar spine, more so at L4-L5 and L5-S1 levels. There is minimal retrolisthesis at L4-L5 level. IMPRESSION: There is no evidence of intestinal obstruction or pneumoperitoneum. There is no hydronephrosis. Enlarged fatty liver. Small ascites. Severe COPD. Lumbar spondylosis. Electronically Signed   By: PElmer PickerM.D.   On: 03/06/2022 15:23   DG Chest 2 View  Result Date: 03/06/2022 CLINICAL DATA:  Shortness of breath. EXAM: CHEST - 2 VIEW COMPARISON:  CT scan of March 05, 2020. FINDINGS: Mild cardiomegaly is noted. Emphysematous disease is noted throughout both lungs as well as coarse reticular opacities  concerning for scarring or fibrosis. No definite acute abnormality is noted. Bony thorax is unremarkable. IMPRESSION: Emphysematous changes noted bilaterally as well as coarse reticular opacities bilaterally concerning for fibrosis or scarring. No definite acute abnormality is noted. Aortic Atherosclerosis (ICD10-I70.0) and Emphysema (ICD10-J43.9). Electronically Signed   By: JMarijo ConceptionM.D.   On: 03/06/2022 14:05      ASSESSMENT/PLAN   Acute on chronic hypoxia; multifactorial, came in with bilateral pulmonary eeboli, in a lady who was not very active and significant underlying medical problems including; CTD,  pulmonary fibrosis, pulmonary hypertension, severe copd with bullae and ground glass infiltrates. May concern is there also an IPF flare. Thank God she survived the blood clots to the lung   Bilateral pulmonary emboli. S/p mechanical thromboembolectomy. She is on anticoagulation- Eliquis. Base on her overall condition, limited ambulation, high risk of repeat clots, w discussed at a minimum 6 months of eliquis, but most likely will keep her on it indefinitely. 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 However, the increase ground glass infiltrate. ? Underlying IPF flare. Agree with resuming  mycophenolate 500 mg p.o. twice daily and solumedrol 50 mg iv q 12 hrs, lasix as you doing. 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 Will discuss with rheum ? Adding esbriet.  Pulmonary hypertension. 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 Hence on adcirca. 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 Will add letaris and if need be tyvaso Copd, with bullae dz. No acute bronchospasm. Duo neg q 6 hr while awake. Check alpha-1-antitrypsin phenotype and level. Incentive spirometry Scattered micro nodules bilateral, 0.8 RML nodule, 0.7 cm LUL ground class nodule, will continue surveillance on the out  side,anca, ace pending.     Thank you for allowing me to participate in the care of this patient.   Patient/Family are satisfied with care plan and all questions have been answered.  This document was prepared using Dragon voice recognition software and may include unintentional dictation errors.     Wallene Huh, M.D.  Division of [facility]

## 2022-03-13 NOTE — Progress Notes (Signed)
Progress Note   Patient: Tammy Barrett JSH:[id number] DOB: 1961/02/25 DOA: 03/06/2022     7 DOS: the patient was seen and examined on 03/13/2022   Brief hospital course: [name] is a 61 year old female with hypertension, neuropathy, GERD, pulmonary hypertension, scleroderma, COPD, hepatosteatosis, mixed connective tissue disease, long-term use of immunosuppressant medication, pulmonary fibrosis, who presents to the emergency department 03/06/2022 for chief concerns of poor appetite. [name] 08/10: Initial vitals heart rate of 103, blood pressure 123/103, SpO2 of 72% on room air with improvement to 95% on 5 L nasal cannula. BNP was elevated at 1738.4, high sensitive troponin was 40, creatinine 1.24.  No concerns on CT abdomen/pelvis.  CTA chest (+)Nonocclusive saddle pulmonary embolus extending to the left lower lobe segmental and subsegmental levels as well as into the proximal right main pulmonary artery. Associated right heart strain. No pulmonary infarction.  Severe emphysema, multiple pulmonary nodules to follow-up outpatient. Heparin GTT per pharmacy was initiated.  Patient was admitted to hospitalist service stepdown status with vascular consult for saddle PE. EDP had already consulted vascular who recommended heparin bolus and patient to be taken to the OR for thrombectomy in the a.m.  . 08/11: thrombectomy planned. I was contacted by radiologist Dr. Denna Barrett this morning and due to abnormal appearance of presumed clot and no evidence for DVT as well as significantly dilated RA, radiology is recommending CT chest with contrast to evaluate for potential mediastinal mass as opposed to pulmonary embolus, prior to taking her for thrombectomy.  Confirmed PE.  Also noted 2.3 cm right hilar lymph node, possible inflammatory, recommending follow-up CT with IV contrast 2 to 3 months. Echocardiogram --> LVEF 65 to 70%, normal function, normal diastolic parameters.  Right ventricular systolic function mildly  reduced, moderately increased RV wall thickness, severely elevated pulmonary artery systolic pressure, right atrial size severely dilated.  Underwent thrombolysis/thrombectomy.  [name] 08/12: Remains on heparin drip.  HFNC - requiring 10 L/min this morning but improved to 5 L/min later in the day. Pt reports SOB is overall better. Transferred to progressive status.  [name] 08/13: still requiring 6 /min O2. D/c heparin gtt 48 hours after thrombectomy and transition Eliquis. RN voiced concern low UOP, continue strict I&O and encouraged po intake  . 08/14: requiring 8 L/min overnight and maintaining on 5 L/min O2 this morning, no output documented from yesterday. Rales/coarse breath sounds and JVD --> Lasix 60 mg IV x1 and restart home lasix. CXR obtained to futher eval possible underlying pneumonia - images reviewed, stable cardiomegaly and severe background emphysema no new consolidations.  [name] 08/15: paged pulmonary Dr Tammy Barrett 10:53 AM for routine consult. Pt appears fluid overloaded based on LE edema, rales, JVD but given pulm HTN and underlying lung disease will also seek pulmonary recs if any other interventions might be helpful. Continue lasix for now and strict I&O, daily weights.  [name] 08/16: still needing 9L/min, confirmed pulmonary to see her today.  . 8/17: On 8 L nasal cannula oxygen.  Pulmonary following   Assessment and Plan: * Pulmonary emboli ([name])  Vascular surgery following,s/p thrombectomy 03/07/2022  heparin GTT --> transitioned to oral Eliquis now  Echocardiogram --> LVEF 65 to 70%, normal function, normal diastolic parameters.  Right ventricular systolic function mildly reduced, moderately increased RV wall thickness, severely elevated pulmonary artery systolic pressure, right atrial size severely dilated.  DVT negative  Protein-calorie malnutrition, severe Complicates overall prognosis  (HFpEF) heart failure with preserved ejection fraction ([name]) 03/10/22 evidenced by rales, LE edema,  JVD 08/11 Echocardiogram --> LVEF 65 to 70%, normal function, normal diastolic parameters.  Right ventricular systolic function mildly reduced, moderately increased RV wall thickness, severely elevated pulmonary artery systolic pressure, right atrial size severely dilated.  Continue Lasix 40 mg p.o. twice daily  CXR no significant pulmonary edema  Pulmonology consulted 03/11/22 and following  Lymphadenopathy, mediastinal 2.3 cm right hilar lymph node, possible inflammatory, noted on CT chest 03/07/22   Outpatient follow-up CT with IV contrast 2 to 3 months.  Emphysema, spiromety in office is c/w mild obstruction ([name]) Extensive lung disease, see above for pulmonary fibrosis On IV Solu-Medrol and nebulizer along with inhalers No home inhalers Not compliant w/ home O2  Pulmonology consulted 03/11/22 and following  DuoNeb prn   Acute on chronic respiratory failure with hypoxia ([name]) Secondary to pulmonary emboli Patient at baseline wears 3 L oxygen supplementation as needed for emphysema   Continue oxygen supplementation to maintain SpO2 greater than 92%  Current [name] 8 L oxygen via nasal cannula  Current continue Lasix 40 mg p.o. twice daily  Pulmonology consulted 03/11/22 and following  Net IO Since Admission: 2,423.96 mL [03/13/22 1821]  Mixed connective tissue disease ([name])  status post Plaquenil, prednisone, Imuran  Currently on mycophenolate 500 mg p.o. twice daily  Pulmonary hypertension ([name]) RVC systolic function reduced and dilated RA. Echo as above Concerning for CHF/HFpEF on exam but findings have been stable past few days   Lasix 60 mg IV x1 given 08/14  CXR given cough to eval for pneumonia - no infectious process, no significant edema  Pulmonology for voiding  Continue Lasix 40 mg po bid, strict I&O  Pulmonary fibrosis ([name]) Pulmonary hypertension  Pulmonology following  Continue home tadalafil 20 mg daily        Subjective: Still quite  short of breath.  On 8 L oxygen by nasal cannula  Physical Exam: Vitals:   03/13/22 0353 03/13/22 0753 03/13/22 1148 03/13/22 1555  BP: 119/85 112/85 126/86 115/83  Pulse: (!) 102 97 (!) 107 (!) 107  Resp: _0 Temp: 97.8 F (36.6 C) 98.4 F (36.9 C) 98.6 F (37 C) 98.4 F (36.9 C)  TempSrc:  Oral Oral Oral  SpO2: 92% 90% 94% 90%  Weight:      Height:       61 year old female sitting in the chair having struggle breathing Lungs Rales and crackles throughout both lungs Cardiovascular regular rate and rhythm Abdomen soft, benign Neuro alert and awake, nonfocal Psychiatry normal mood and affect Data Reviewed:  Albumin 2.3  Family Communication: None  Disposition: Status is: Inpatient Remains inpatient appropriate because: Still on 8 L oxygen by nasal cannula   Planned Discharge Destination: Skilled nursing facility    DVT prophylaxis-Eliquis Time spent: 35 minutes  Author: Max Sane, MD 03/13/2022 6:21 PM  For on call review [email or web address].

## 2022-03-13 NOTE — Progress Notes (Signed)
Occupational Therapy Treatment Patient Details Name: Tammy Barrett MRN: [id number] DOB: 1961/04/21 Today's Date: 03/13/2022   History of present illness Pt is a 61 y.o. female presenting to hospital 03/06/22 with c/o decreased appetite, some difficulty swallowing, and leg swelling.  Pt admitted with PE (large saddle pulmonary embolism with R heart strain), and acute on chronic respiratory failure with hypoxia.  S/p thrombectomy of saddle PE 03/07/22.  PMH includes htn, lupus, scleroderma, pulmonary fibrosis, pulmonary htn, mixed connective tissue disease, esophageal dysmotility, COPD, HSV, peripheral neuropathy, and Raynaud's disease.  O2 use as needed.   OT comments  Ms. Soza made good effort today but continues to be limited by her breathing challenges. She was able to perform transfers to/from recliner and BSC, to ambulate ~ 100 ft, to perform grooming in standing at sink, but reported slight dizziness with ambulation and needed to take standing rest breaks. On 8 L O2 [facility], O2 sats dropped to 77% with activity, increased to 85% with sitting. Provided educ re: energy conservation strategies, activity modifications. Will continue to follow acutely.   Recommendations for follow up therapy are one component of a multi-disciplinary discharge planning process, led by the attending physician.  Recommendations may be updated based on patient status, additional functional criteria and insurance authorization.    Follow Up Recommendations  Skilled nursing-short term rehab (<3 hours/day)    Assistance Recommended at Discharge Intermittent Supervision/Assistance  Patient can return home with the following  A little help with walking and/or transfers;A little help with bathing/dressing/bathroom;Assistance with cooking/housework;Help with stairs or ramp for entrance;Assist for transportation   Equipment Recommendations       Recommendations for Other Services      Precautions / Restrictions  Precautions Precautions: Fall Precaution Comments: Mod Fall Risk Restrictions Weight Bearing Restrictions: No       Mobility Bed Mobility               General bed mobility comments: Pt received/left in recliner    Transfers Overall transfer level: Needs assistance Equipment used: 1 person hand held assist Transfers: Sit to/from Stand, Bed to chair/wheelchair/BSC Sit to Stand: Supervision     Step pivot transfers: Supervision           Balance Overall balance assessment: Needs assistance Sitting-balance support: No upper extremity supported, Feet supported Sitting balance-Leahy Scale: Good     Standing balance support: During functional activity, Single extremity supported, Bilateral upper extremity supported Standing balance-Leahy Scale: Good Standing balance comment: Able to reach beyond BOS during grooming, single UE support on sink cabinet                           ADL either performed or assessed with clinical judgement   ADL Overall ADL's : Needs assistance/impaired     Grooming: Wash/dry hands;Wash/dry face;Standing;Supervision/safety                   Toilet Transfer: Supervision/safety;BSC/3in1;Stand-pivot   Writer and Hygiene: Supervision/safety;Sitting/lateral lean         General ADL Comments: SUPV/CGA for safety with OOB fxl mobility    Extremity/Trunk Assessment Upper Extremity Assessment Upper Extremity Assessment: Generalized weakness   Lower Extremity Assessment Lower Extremity Assessment: Generalized weakness   Cervical / Trunk Assessment Cervical / Trunk Assessment: Normal    Vision       Perception     Praxis      Cognition Arousal/Alertness: Awake/alert Behavior During Therapy: WFL for tasks  assessed/performed Overall Cognitive Status: Within Functional Limits for tasks assessed                                          Exercises Other Exercises Other  Exercises: Educ re: meaningful occupations, activity modifications, ECS    Shoulder Instructions       General Comments      Pertinent Vitals/ Pain       Pain Assessment Pain Assessment: No/denies pain  Home Living                                          Prior Functioning/Environment              Frequency  Min 2X/week        Progress Toward Goals  OT Goals(current goals can now be found in the care plan section)  Progress towards OT goals: Progressing toward goals  Acute Rehab OT Goals OT Goal Formulation: With patient Time For Goal Achievement: 03/24/22 Potential to Achieve Goals: Good  Plan Discharge plan remains appropriate;Frequency remains appropriate    Co-evaluation                 AM-PAC OT "6 Clicks" Daily Activity     Outcome Measure   Help from another person eating meals?: None Help from another person taking care of personal grooming?: A Little Help from another person toileting, which includes using toliet, bedpan, or urinal?: A Little Help from another person bathing (including washing, rinsing, drying)?: A Little Help from another person to put on and taking off regular upper body clothing?: A Little Help from another person to put on and taking off regular lower body clothing?: A Little 6 Click Score: 19    End of Session Equipment Utilized During Treatment: Rolling walker (2 wheels);Oxygen  OT Visit Diagnosis: Unsteadiness on feet (R26.81);Muscle weakness (generalized) (M62.81)   Activity Tolerance Patient tolerated treatment well;Treatment limited secondary to medical complications (Comment) (SOB)   Patient Left with call bell/phone within reach;in chair   Nurse Communication          Time: 8016-5537 OT Time Calculation (min): 33 min  Charges: OT General Charges $OT Visit: 1 Visit OT Treatments $Self Care/Home Management : 23-37 mins  Josiah Lobo, PhD, MS, OTR/L 03/13/22, 3:36 PM

## 2022-03-13 NOTE — Plan of Care (Signed)

## 2022-03-13 NOTE — TOC Progression Note (Signed)
Transition of Care [facility]) - Progression Note    Patient Details  Name: NIASIA LANPHEAR MRN: [id number] Date of Birth: [birth year]  Transition of Care [facility]) CM/SW [place], LCSW Phone Number: 03/13/2022, 8:28 AM  Clinical Narrative:  Patient still on 9 L of oxygen. TOC continues to follow for improvement in oxygen requirements so SNF referral can be sent out.   Expected Discharge Plan: [place] Barriers to Discharge: Continued Medical Work up  Expected Discharge Plan and Services Expected Discharge Plan: [place] Choice: [place] arrangements for the past 2 months: Single Family Home                                       Social Determinants of Health (SDOH) Interventions    Readmission Risk Interventions     No data to display

## 2022-03-13 NOTE — Assessment & Plan Note (Signed)
Complicates overall prognosis

## 2022-03-13 NOTE — Consult Note (Signed)
Consultation Note Date: 03/13/2022   Patient Name: Tammy Barrett  DOB: September 04, 1960  MRN: [id number]  Age / Sex: 61 y.o., female  PCP: Dion Body, MD Referring Physician: Max Sane, MD  Reason for Consultation: Establishing goals of care  HPI/Patient Profile: 61 y.o. female  with past medical history of HTN, pulmonary HTN, COPD, scleroderma, Raynaud's disease, pulmonary fibrosis, and neuropathy admitted on 03/06/2022 with shortness of breath with exertion.   Patient is being treated for large saddle pulmonary embolism with right heart strain.  8/11, patient underwent mechanical thromboembolectomy.  Pulmonary and palliative medicine has been consulted.  PMT was consulted to discuss goals of care.  Clinical Assessment and Goals of Care: I have reviewed medical records including EPIC notes, labs and imaging, assessed the patient and then met with patient at bedside to discuss diagnosis prognosis, GOC, EOL wishes, disposition and options.  I introduced Palliative Medicine as specialized medical care for people living with serious illness. It focuses on providing relief from the symptoms and stress of a serious illness. The goal is to improve quality of life for both the patient and the family.  We discussed a brief life review of the patient.  Patient was widowed the day after her birthday last year.  She has 1 daughter who lives in [place].  Patient was working part-time in Charity fundraiser at [employer].  Mourning, grief, and bereavement discussed.  Therapeutic silence and active listening provided for patient to share her thoughts and emotions regarding the loss of her husband and her current medical situation.  As far as functional and nutritional status PTA patient shares she had no issues with functional, nutritional, or cognitive abilities.  She lives independently and completed all ADLs without  difficulty.  She endorses shortness of breath of the last several weeks that worsened to the point where she "stopped being hardheaded" and let her friend/Deacon at her church bring her into the hospital.  We discussed patient's current illness and what it means in the larger context of patient's on-going co-morbidities.  Natural disease trajectory and expectations at EOL were discussed. I attempted to elicit values and goals of care important to the patient.  She says she wants to get better so that she continue to do things.  She values her independence and is hopeful that she can regain her strength and ability to remain independent at home.  Advance directives, concepts specific to code status, artificial feeding and hydration, and rehospitalization were considered and discussed.  Patient states it is overwhelming to think about this right now.   Discussed surrogate decision maker.  In the event patient is unable to speak for herself, patient says that she would like her sister to be her surrogate decision maker.  Discussed with patient the value writing down her wishes in an advanced care planning document.  She says others have asked her to make a living will but she shares that it seems like such as a process that she just has not sat down to do it.  Discussed patient could have advanced directive to review and potentially complete while in the hospital.  She shares she is not ready for that right now.   Discussed with patient the importance of continued conversation with family and the medical providers regarding overall plan of care and treatment options, ensuring decisions are within the context of the patient's values and GOCs.   Briefly discussed plan to go to a skilled nursing facility for rehab.  Patient is in agreement to SNF with rehab as the next step towards gaining her independence back.  Questions and concerns were addressed.  Patient was encouraged to call with questions or concerns.   Palliative medicine team will continue to follow the patient throughout her hospitalization.  I am on service and plan on rounding on the patient again tomorrow to continue goals of care discussions.  Primary Decision Maker PATIENT  Code Status/Advance Care Planning: Full code  Prognosis:   Unable to determine  Discharge Planning: [place] for rehab with Palliative care service follow-up  Primary Diagnoses: Present on Admission:  Pulmonary emboli ([place])  Pulmonary fibrosis ([place])  Pulmonary hypertension ([place])  Mixed connective tissue disease ([place])  Essential hypertension  GERD (gastroesophageal reflux disease)  Acute on chronic respiratory failure with hypoxia ([place])   Physical Exam Vitals reviewed.  Constitutional:      Appearance: Normal appearance. She is normal weight.  HENT:     Head: Normocephalic.     Mouth/Throat:     Mouth: Mucous membranes are moist.  Eyes:     Pupils: Pupils are equal, round, and reactive to light.  Cardiovascular:     Rate and Rhythm: Normal rate.     Pulses: Normal pulses.  Pulmonary:     Effort: Pulmonary effort is normal.     Comments: Dyspnea at rest with extended conversation Abdominal:     Palpations: Abdomen is soft.  Musculoskeletal:        General: Normal range of motion.  Skin:    General: Skin is warm and dry.  Neurological:     Mental Status: She is alert and oriented to person, place, and time.  Psychiatric:        Mood and Affect: Mood normal.        Behavior: Behavior normal.        Thought Content: Thought content normal.        Judgment: Judgment normal.     Palliative Assessment/Data: 60%     Thank you for this consult. Palliative medicine will continue to follow and assist holistically.   Time Total: 75 minutes Greater than 50%  of this time was spent counseling and coordinating care related to the above assessment and plan.  Signed by: Jordan Hawks, DNP, FNP-BC Palliative Medicine     Please contact Palliative Medicine Team phone at [phone] for questions and concerns.  For individual provider: See Shea Evans

## 2022-03-13 NOTE — Progress Notes (Signed)
Mobility Specialist - Progress Note     03/13/22 1403  Mobility  Activity Transferred to/from [employer] [facility]  Level of Assistance Standby assist, set-up cues, supervision of patient - no hands on  Assistive Device BSC  Distance Ambulated (ft) 2 ft  Activity Response Tolerated well  $Mobility charge 1 Mobility   Pt sitting in chair upon entry. Pt utilizing [place] 8L. Pt transferred from chair to [facility] with SBA. Pt left on BSC. OT entry upon exit.   [place]ie [place] Mobility Specialist 03/13/22 2:15 PM

## 2022-03-13 NOTE — Progress Notes (Signed)
Nutrition Follow-up  DOCUMENTATION CODES:   Severe malnutrition in context of chronic illness  INTERVENTION:   -Continue Ensure Enlive po TID, each supplement provides 350 kcal and 20 grams of protein -Continue MVI with minerals daily -Continue with liberalized diet of regular  NUTRITION DIAGNOSIS:   Severe Malnutrition related to chronic illness (COPD, scleroderma) as evidenced by moderate fat depletion, severe fat depletion, moderate muscle depletion, severe muscle depletion.  Ongoing  GOAL:   Patient will meet greater than or equal to 90% of their needs  Progressing   MONITOR:   PO intake, Supplement acceptance  REASON FOR ASSESSMENT:   Malnutrition Screening Tool    ASSESSMENT:   Pt with PMH hypertension, neuropathy, GERD, pulmonary hypertension, scleroderma, COPD, hepatosteatosis, mixed connective tissue disease, long-term use of immunosuppressant medication, pulmonary fibrosis, who presents for chief concerns of poor appetite.  8/11- s/p thrombectomy  Reviewed I/O's: +970 ml x 24 hours and +2.4 L since admission  UOP: 350 ml x 24 hours  Pt receiving nursing care at time of visit.   Per MD notes, pt not stable for discharge secondary to oxygenation.   Pt with improved oral intake. Noted meal completions 50-100%. Pt is consuming Ensure Enlive supplements.    Medications reviewed and include lasix.   Labs reviewed: Na: 134, CBGS: 126 (inpatient orders for glycemic control are none).    Diet Order:   Diet Order             Diet regular Room service appropriate? Yes; Fluid consistency: Thin  Diet effective now                   EDUCATION NEEDS:   Education needs have been addressed  Skin:  Skin Assessment: Skin Integrity Issues: Skin Integrity Issues:: Stage II Stage II: rt groin  Last BM:  03/09/22  Height:   Ht Readings from Last 1 Encounters:  03/10/22 5' 5" (1.651 m)    Weight:   Wt Readings from Last 1 Encounters:  03/10/22  68.4 kg    Ideal Body Weight:  56.8 kg  BMI:  Body mass index is 25.11 kg/m.  Estimated Nutritional Needs:   Kcal:  2050-2250  Protein:  105-120 grams  Fluid:  > 2 L    Loistine Chance, RD, LDN, [place] Registered Dietitian II Certified Diabetes Care and Education Specialist Please refer to [facility] for RD and/or RD on-call/weekend/after hours pager

## 2022-03-14 DIAGNOSIS — J9601 Acute respiratory failure with hypoxia: Secondary | ICD-10-CM | POA: Diagnosis not present

## 2022-03-14 DIAGNOSIS — I2602 Saddle embolus of pulmonary artery with acute cor pulmonale: Secondary | ICD-10-CM | POA: Diagnosis not present

## 2022-03-14 DIAGNOSIS — I5032 Chronic diastolic (congestive) heart failure: Secondary | ICD-10-CM

## 2022-03-14 DIAGNOSIS — J439 Emphysema, unspecified: Secondary | ICD-10-CM | POA: Diagnosis not present

## 2022-03-14 DIAGNOSIS — I10 Essential (primary) hypertension: Secondary | ICD-10-CM | POA: Diagnosis not present

## 2022-03-14 LAB — BASIC METABOLIC PANEL
Anion gap: 6 (ref 5–15)
BUN: 31 mg/dL — ABNORMAL HIGH (ref 6–20)
CO2: 26 mmol/L (ref 22–32)
Calcium: 8.5 mg/dL — ABNORMAL LOW (ref 8.9–10.3)
Chloride: 103 mmol/L (ref 98–111)
Creatinine, Ser: 0.69 mg/dL (ref 0.44–1.00)
GFR, Estimated: >60 mL/min (ref >60)
Glucose, Bld: 131 mg/dL — ABNORMAL HIGH (ref 70–99)
Potassium: 5.2 mmol/L — ABNORMAL HIGH (ref 3.5–5.1)
Sodium: 135 mmol/L (ref 135–145)

## 2022-03-14 LAB — CBC
HCT: 42.5 % (ref 36.0–46.0)
Hemoglobin: 14 g/dL (ref 12.0–15.0)
MCH: 31.1 pg (ref 26.0–34.0)
MCHC: 32.9 g/dL (ref 30.0–36.0)
MCV: 94.4 fL (ref 80.0–100.0)
Platelets: 260 10*3/uL (ref 150–400)
RBC: 4.5 MIL/uL (ref 3.87–5.11)
RDW: 17.7 % — ABNORMAL HIGH (ref 11.5–15.5)
WBC: 6.9 10*3/uL (ref 4.0–10.5)
nRBC: 0.3 % — ABNORMAL HIGH (ref 0.0–0.2)

## 2022-03-14 LAB — ANCA PROFILE
Anti-MPO Antibodies: <0.2 units (ref 0.0–0.9)
Anti-PR3 Antibodies: <0.2 units (ref 0.0–0.9)
Atypical P-ANCA titer: <1:20 {titer}
C-ANCA: <1:20 {titer}
P-ANCA: <1:20 {titer}

## 2022-03-14 MED ORDER — AMBRISENTAN 5 MG PO TABS
5.0000 mg | ORAL_TABLET | Freq: Every day | ORAL | Status: AC
  Administered 2022-03-14 – 2022-03-20 (×7): 5 mg via ORAL
  Filled 2022-03-14 (×10): qty 1

## 2022-03-14 MED ORDER — FUROSEMIDE 10 MG/ML IJ SOLN
40.0000 mg | INTRAMUSCULAR | Status: AC
  Administered 2022-03-14: 40 mg via INTRAVENOUS
  Filled 2022-03-14: qty 4

## 2022-03-14 MED ORDER — SALINE SPRAY 0.65 % NA SOLN
1.0000 | NASAL | Status: DC | PRN
  Filled 2022-03-14 (×2): qty 44

## 2022-03-14 NOTE — Assessment & Plan Note (Signed)
Complicates overall prognosis.

## 2022-03-14 NOTE — Assessment & Plan Note (Signed)
Extensive lung disease, see above for pulmonary fibrosis On IV Solu-Medrol and nebulizer along with inhalers No home inhalers Not compliant w/ home O2  Pulmonology following  DuoNeb prn

## 2022-03-14 NOTE — Assessment & Plan Note (Signed)
Pulmonary hypertension  Pulmonology following  Continue home tadalafil 20 mg daily 

## 2022-03-14 NOTE — Care Management Important Message (Signed)
Important Message  Patient Details  Name: Tammy Barrett MRN: [id number] Date of Birth: 1960-09-01   Medicare Important Message Given:  Yes     Dannette Barbara 03/14/2022, 11:37 AM

## 2022-03-14 NOTE — Progress Notes (Signed)
Pulmonary Medicine          Date: 03/14/2022,   MRN# [id number] Tammy Barrett 01-Oct-1960       HISTORY OF PRESENT ILLNESS   Brighter day for her. The high flow set has helped tremendously the dry and burning from the [place] o2. Fio2 is less ( 35 %) this am. No new complaints. Palliative has seen. She will need nursing facility rehab.    PAST MEDICAL HISTORY   Past Medical History:  Diagnosis Date   COPD (chronic obstructive pulmonary disease) ([name])    Discoid lupus    Eczema    Esophageal dysmotility    Genital herpes    GERD (gastroesophageal reflux disease)    HSV (herpes simplex virus) infection    Hypertension    Lumbago    Lumbar disc disease    Peripheral neuropathy    Pulmonary fibrosis ([place])    Pulmonary hypertension ([name])    Raynaud's disease    Sclerodactyly    Scleroderma ([place])    Spinal stenosis      SURGICAL HISTORY   Past Surgical History:  Procedure Laterality Date   bone removal from pinkie toe     COLONOSCOPY WITH PROPOFOL N/A 08/30/2021   Procedure: COLONOSCOPY WITH PROPOFOL;  Surgeon: [name], MD;  Location: [facility];  Service: Gastroenterology;  Laterality: N/A;   DILATION AND CURETTAGE OF UTERUS     PULMONARY THROMBECTOMY N/A 03/07/2022   Procedure: PULMONARY THROMBECTOMY;  Surgeon: Algernon Huxley, MD;  Location: [facility];  Service: Cardiovascular;  Laterality: N/A;   RIGHT HEART CATH Right 09/17/2018   Procedure: RIGHT HEART CATH;  Surgeon: Teodoro Spray, MD;  Location: [facility];  Service: Cardiovascular;  Laterality: Right;   SPLENECTOMY     TUBAL LIGATION       FAMILY HISTORY   Family History  Problem Relation Age of Onset   Uterine cancer Mother    Stroke Father    Breast cancer Neg Hx      SOCIAL HISTORY   Social History   Tobacco Use   Smoking status: Former    Packs/day: 0.50    Types: Cigarettes    Quit date: 01/08/2011    Years since quitting: 11.1   Smokeless tobacco:  Never  Vaping Use   Vaping Use: Never used  Substance Use Topics   Alcohol use: No   Drug use: No     MEDICATIONS    Home Medication:    Current Medication:  Current Facility-Administered Medications:    ambrisentan (LETAIRIS) tablet 5 mg, 5 mg, Oral, Daily, Kellar Westberg E, MD   amLODipine (NORVASC) tablet 10 mg, 10 mg, Oral, Daily, Dew, Jason S, MD, 10 mg at 03/14/22 0930   apixaban (ELIQUIS) tablet 10 mg, 10 mg, Oral, BID, 10 mg at 03/14/22 0930 **FOLLOWED BY** [START ON 03/16/2022] apixaban (ELIQUIS) tablet 5 mg, 5 mg, Oral, BID, [name], [facility]   Chlorhexidine Gluconate Cloth 2 % PADS 6 each, 6 each, Topical, Q0600, Algernon Huxley, MD, 6 each at 03/14/22 0432   feeding supplement (ENSURE ENLIVE / ENSURE PLUS) liquid 237 mL, 237 mL, Oral, TID BM, [name], [name], DO, 237 mL at 03/14/22 0931   fluticasone (FLONASE) 50 MCG/ACT nasal spray 1 spray, 1 spray, Each Nare, Daily PRN, Algernon Huxley, MD, 1 spray at 03/12/22 2153   furosemide (LASIX) tablet 40 mg, 40 mg, Oral, BID, Emeterio Reeve, DO, 40 mg at 03/14/22 [phone]  gabapentin (NEURONTIN) tablet 600 mg, 600 mg, Oral, TID, Dew, Erskine Squibb, MD, 600 mg at 03/14/22 0930   hydrocortisone cream 1 %, , Topical, QID PRN, Emeterio Reeve, DO   HYDROmorphone (DILAUDID) injection 1 mg, 1 mg, Intravenous, Once PRN, Lucky Cowboy, Erskine Squibb, MD   ipratropium-albuterol (DUONEB) 0.5-2.5 (3) MG/3ML nebulizer solution 3 mL, 3 mL, Nebulization, Q4H PRN, Emeterio Reeve, DO   methylPREDNISolone sodium succinate (SOLU-MEDROL) 125 mg/2 mL injection 80 mg, 80 mg, Intravenous, Q24H, [name], Haroun Cotham E, MD, 80 mg at 03/13/22 2000   multivitamin with minerals tablet 1 tablet, 1 tablet, Oral, Daily, Emeterio Reeve, DO, 1 tablet at 03/14/22 5974   mycophenolate (CELLCEPT) capsule 500 mg, 500 mg, Oral, BID, Lucky Cowboy, Erskine Squibb, MD, 500 mg at 03/14/22 1638   Oral care mouth rinse, 15 mL, Mouth Rinse, PRN, Sharion Settler, NP   pantoprazole (PROTONIX) EC  tablet 40 mg, 40 mg, Oral, Daily, Dew, Erskine Squibb, MD, 40 mg at 03/14/22 0930   senna-docusate (Senokot-S) tablet 1 tablet, 1 tablet, Oral, QHS PRN, Algernon Huxley, MD, 1 tablet at 03/09/22 1347   sodium chloride (OCEAN) 0.65 % nasal spray 1 spray, 1 spray, Each Nare, PRN, Max Sane, MD   tadalafil (PAH) (ADCIRCA) tablet 20 mg, 20 mg, Oral, Daily, Dew, Erskine Squibb, MD, 20 mg at 03/14/22 4536    ALLERGIES   Hydroxychloroquine and Meclizine     REVIEW OF SYSTEMS    Review of Systems:  Gen:  Denies  fever, sweats, chills weigh loss  HEENT: Denies blurred vision, double vision, ear pain, eye pain, hearing loss, nose bleeds, sore throat Cardiac:  No dizziness, chest pain or heaviness, chest tightness,edema Resp:   Denies cough or sputum porduction, shortness of breath,wheezing, hemoptysis,  Gi: Denies swallowing difficulty, stomach pain, nausea or vomiting, diarrhea, constipation, bowel incontinence Gu:  Denies bladder incontinence, burning urine Ext:   Denies Joint pain, stiffness or swelling Skin: Denies  skin rash, easy bruising or bleeding or hives Endoc:  Denies polyuria, polydipsia , polyphagia or weight change Psych:   Denies depression, insomnia or hallucinations   Other:  All other systems negative   VS: BP 119/77 (BP Location: Left Arm)   Pulse (!) 103   Temp 98.7 F (37.1 C)   Resp 17   Ht _0  (1.651 m)   Wt 68.4 kg   SpO2 90%   BMI 25.11 kg/m      PHYSICAL EXAM    GENERAL:NAD, no fevers, chills, no weakness no fatigue HEAD: Normocephalic, atraumatic.  EYES: Pupils equal, round, reactive to light. Extraocular muscles intact. No scleral icterus.  MOUTH: Moist mucosal membrane. Dentition intact. No abscess noted.  EAR, NOSE, THROAT: Clear without exudates. No external lesions.  NECK: Supple. No thyromegaly. No nodules. No JVD.  PULMONARY: Diffuse crackles CARDIOVASCULAR: S1 and S2. Regular rate and rhythm. No murmurs, rubs, or gallops. No edema. Pedal pulses 2+  bilaterally.  GASTROINTESTINAL: Soft, nontender, nondistended. No masses. Positive bowel sounds. No hepatosplenomegaly.  MUSCULOSKELETAL: No swelling, clubbing, ++ edema. Range of motion full in all extremities.  NEUROLOGIC: Cranial nerves II through XII are intact. No gross focal neurological deficits. Sensation intact. Reflexes intact.  SKIN: No ulceration, lesions, rashes, or cyanosis. Skin warm and dry. Turgor intact.  PSYCHIATRIC: Mood, affect within normal limits. The patient is awake, alert and oriented x 3. Insight, judgment intact.       IMAGING    DG Chest Port 1 View  Result Date: 03/10/2022 CLINICAL DATA:  Leg  swelling EXAM: PORTABLE CHEST 1 VIEW COMPARISON:  Chest x-ray dated March 06, 2022; chest CT dated March 07, 2022 FINDINGS: Unchanged cardiomegaly. Severe background emphysema with areas of nodularity, better evaluated on recent chest CT. No new focal consolidation. The visualized skeletal structures are unremarkable. IMPRESSION: Severe emphysema with no new focal consolidation. Electronically Signed   By: Yetta Glassman M.D.   On: 03/10/2022 10:25   PERIPHERAL VASCULAR CATHETERIZATION  Result Date: 03/07/2022 See surgical note for result.  ECHOCARDIOGRAM COMPLETE  Result Date: 03/07/2022    ECHOCARDIOGRAM REPORT   Patient Name:   TRACYE SZUCH Date of Exam: 03/07/2022 Medical Rec #:  [id number]        Height:       65.0 in Accession #:    [id number]       Weight:       142.4 lb Date of Birth:  1961/04/23        BSA:          1.712 m Patient Age:    61 years         BP:           118/100 mmHg Patient Gender: F                HR:           95 bpm. Exam Location:  [facility] Procedure: 2D Echo, Cardiac Doppler and Color Doppler Indications:     Pulmonary Embolus I26.09  History:         Patient has no prior history of Echocardiogram examinations.                  COPD and Pulmonary HTN; Risk Factors:Hypertension.  Sonographer:     Sherrie Sport Referring Phys:  [id number] AMY N COX  Diagnosing Phys: Serafina Royals MD IMPRESSIONS  1. Left ventricular ejection fraction, by estimation, is 65 to 70%. The left ventricle has normal function. The left ventricle has no regional wall motion abnormalities. Left ventricular diastolic parameters were normal.  2. Right ventricular systolic function is mildly reduced. The right ventricular size is severely enlarged. Moderately increased right ventricular wall thickness. There is severely elevated pulmonary artery systolic pressure.  3. Right atrial size was severely dilated.  4. The mitral valve is normal in structure. Trivial mitral valve regurgitation.  5. Tricuspid valve regurgitation is severe.  6. The aortic valve is normal in structure. Aortic valve regurgitation is trivial. FINDINGS  Left Ventricle: Left ventricular ejection fraction, by estimation, is 65 to 70%. The left ventricle has normal function. The left ventricle has no regional wall motion abnormalities. The left ventricular internal cavity size was small. There is no left ventricular hypertrophy. Left ventricular diastolic parameters were normal. Right Ventricle: The right ventricular size is severely enlarged. Moderately increased right ventricular wall thickness. Right ventricular systolic function is mildly reduced. There is severely elevated pulmonary artery systolic pressure. Left Atrium: Left atrial size was normal in size. Right Atrium: Right atrial size was severely dilated. Pericardium: There is no evidence of pericardial effusion. Mitral Valve: The mitral valve is normal in structure. Trivial mitral valve regurgitation. Tricuspid Valve: The tricuspid valve is normal in structure. Tricuspid valve regurgitation is severe. Aortic Valve: The aortic valve is normal in structure. Aortic valve regurgitation is trivial. Aortic valve mean gradient measures 2.0 mmHg. Aortic valve peak gradient measures 2.8 mmHg. Aortic valve area, by VTI measures 2.37 cm. Pulmonic Valve: The pulmonic valve  was normal in structure. Pulmonic valve regurgitation  is mild. Aorta: The aortic root and ascending aorta are structurally normal, with no evidence of dilitation. IAS/Shunts: No atrial level shunt detected by color flow Doppler.  LEFT VENTRICLE PLAX 2D LVIDd:         2.60 cm     Diastology LVIDs:         2.00 cm     LV e' medial:    5.87 cm/s LV PW:         1.20 cm     LV E/e' medial:  6.0 LV IVS:        1.20 cm     LV e' lateral:   11.30 cm/s LVOT diam:     2.00 cm     LV E/e' lateral: 3.1 LV SV:         25 LV SV Index:   15 LVOT Area:     3.14 cm  LV Volumes (MOD) LV vol d, MOD A4C: 17.0 ml LV vol s, MOD A4C: 10.5 ml LV SV MOD A4C:     17.0 ml RIGHT VENTRICLE RV Basal diam:  4.30 cm RV S prime:     7.72 cm/s TAPSE (M-mode): 1.6 cm LEFT ATRIUM             Index        RIGHT ATRIUM           Index LA diam:        3.40 cm 1.99 cm/m   RA Area:     42.90 cm LA Vol (A2C):   67.6 ml 39.48 ml/m  RA Volume:   218.00 ml 127.31 ml/m LA Vol (A4C):   17.7 ml 10.34 ml/m LA Biplane Vol: 35.7 ml 20.85 ml/m  AORTIC VALVE AV Area (Vmax):    2.32 cm AV Area (Vmean):   2.02 cm AV Area (VTI):     2.37 cm AV Vmax:           83.20 cm/s AV Vmean:          59.100 cm/s AV VTI:            0.106 m AV Peak Grad:      2.8 mmHg AV Mean Grad:      2.0 mmHg LVOT Vmax:         61.40 cm/s LVOT Vmean:        38.000 cm/s LVOT VTI:          0.080 m LVOT/AV VTI ratio: 0.75  AORTA Ao Root diam: 3.15 cm MITRAL VALVE               TRICUSPID VALVE MV Area (PHT): 8.92 cm    TR Peak grad:   74.0 mmHg MV Decel Time: 85 msec     TR Vmax:        430.00 cm/s MV E velocity: 35.10 cm/s MV A velocity: 74.60 cm/s  SHUNTS MV E/A ratio:  0.47        Systemic VTI:  0.08 m                            Systemic Diam: 2.00 cm Serafina Royals MD Electronically signed by Serafina Royals MD Signature Date/Time: 03/07/2022/12:58:25 PM    Final    CT CHEST W CONTRAST  Result Date: 03/07/2022 CLINICAL DATA:  Soft tissue mass. EXAM: CT CHEST WITH CONTRAST TECHNIQUE:  Multidetector CT imaging of the chest was performed during intravenous contrast administration. RADIATION DOSE  REDUCTION: This exam was performed according to the departmental dose-optimization program which includes automated exposure control, adjustment of the mA and/or kV according to patient size and/or use of iterative reconstruction technique. CONTRAST:  32m OMNIPAQUE IOHEXOL 300 MG/ML  SOLN COMPARISON:  CTA of the chest of March 06, 2022. FINDINGS: Cardiovascular: As noted on the exam performed yesterday, there is again noted large saddle embolus extending from proximal right pulmonary artery and through left pulmonary artery into the left lower lobe branches. This is not significantly changed compared to yesterday. Atherosclerosis of thoracic aorta is noted without aneurysm or dissection. No pericardial effusion is noted. Mild cardiomegaly is noted. Mediastinum/Nodes: Thyroid gland is unremarkable. The esophagus appears normal. 2.3 cm right hilar lymph node is noted. Lungs/Pleura: No pneumothorax or pleural effusion is noted. Emphysematous disease and scarring is noted throughout both lungs. Stable 5 mm nodule is noted in right lower lobe best seen on image number 73 of series 3. Stable 3 mm nodule is noted in left upper lobe best seen on image number 39 of series 3. Stable 3 mm nodules also seen in left upper lobe best seen on image number 49 of series 3. Upper Abdomen: No acute abnormality. Musculoskeletal: No chest wall abnormality. No acute or significant osseous findings. IMPRESSION: There is stable large pulmonary embolus extending primarily through the left pulmonary artery and into the left lower lobe branches as described on prior exam. 2.3 cm right hilar lymph node is noted which may be inflammatory in etiology, but neoplasm cannot be excluded. Follow-up CT scan with intravenous contrast is recommended in 2-3 months to evaluate stability. Extensive emphysematous disease and scarring is noted  throughout both lungs as described on prior exam. Multiple pulmonary nodules are again noted as described on prior exam performed yesterday, with the largest measuring 5 mm in the right lower lobe. No follow-up needed if patient is low-risk (and has no known or suspected primary neoplasm). Non-contrast chest CT can be considered in 12 months if patient is high-risk. This recommendation follows the consensus statement: Guidelines for Management of Incidental Pulmonary Nodules Detected on CT Images: From the Fleischner Society 2017; Radiology 2017; 284:228-243. Aortic Atherosclerosis (ICD10-I70.0) and Emphysema (ICD10-J43.9). Electronically Signed   By: JMarijo ConceptionM.D.   On: 03/07/2022 11:14   UK[place]Venous Img Lower Bilateral (DVT)  Result Date: 03/07/2022 CLINICAL DATA:  Patient with pulmonary embolism.  Evaluate for DVT. EXAM: BILATERAL LOWER EXTREMITY VENOUS DOPPLER ULTRASOUND TECHNIQUE: Gray-scale sonography with graded compression, as well as color Doppler and duplex ultrasound were performed to evaluate the lower extremity deep venous systems from the level of the common femoral vein and including the common femoral, femoral, profunda femoral, popliteal and calf veins including the posterior tibial, peroneal and gastrocnemius veins when visible. The superficial great saphenous vein was also interrogated. Spectral Doppler was utilized to evaluate flow at rest and with distal augmentation maneuvers in the common femoral, femoral and popliteal veins. COMPARISON:  None Available. FINDINGS: RIGHT LOWER EXTREMITY Common Femoral Vein: No evidence of thrombus. Normal compressibility, respiratory phasicity and response to augmentation. Saphenofemoral Junction: No evidence of thrombus. Normal compressibility and flow on color Doppler imaging. Profunda Femoral Vein: No evidence of thrombus. Normal compressibility and flow on color Doppler imaging. Femoral Vein: No evidence of thrombus. Normal compressibility,  respiratory phasicity and response to augmentation. Popliteal Vein: No evidence of thrombus. Normal compressibility, respiratory phasicity and response to augmentation. Calf Veins: No evidence of thrombus. Normal compressibility and flow on color Doppler imaging.  Other Findings:  None. LEFT LOWER EXTREMITY Common Femoral Vein: No evidence of thrombus. Normal compressibility, respiratory phasicity and response to augmentation. Saphenofemoral Junction: No evidence of thrombus. Normal compressibility and flow on color Doppler imaging. Profunda Femoral Vein: No evidence of thrombus. Normal compressibility and flow on color Doppler imaging. Femoral Vein: No evidence of thrombus. Normal compressibility, respiratory phasicity and response to augmentation. Popliteal Vein: No evidence of thrombus. Normal compressibility, respiratory phasicity and response to augmentation. Calf Veins: No evidence of thrombus. Normal compressibility and flow on color Doppler imaging. Other Findings:  None. IMPRESSION: No evidence of deep venous thrombosis in either lower extremity. Electronically Signed   By: Markus Daft M.D.   On: 03/07/2022 07:29   CT Angio Chest PE W/Cm &/Or Wo Cm  Result Date: 03/06/2022 CLINICAL DATA:  CT Abdomen Pelvis W Contrast suspected, high prob Abdominal pain, acute, nonlocalized EXAM: CT ANGIOGRAPHY CHEST WITH CONTRAST TECHNIQUE: Multidetector CT imaging of the chest was performed using the standard protocol during bolus administration of intravenous contrast. Multiplanar CT image reconstructions and MIPs were obtained to evaluate the vascular anatomy. RADIATION DOSE REDUCTION: This exam was performed according to the departmental dose-optimization program which includes automated exposure control, adjustment of the mA and/or kV according to patient size and/or use of iterative reconstruction technique. CONTRAST:  33m OMNIPAQUE IOHEXOL 350 MG/ML SOLN COMPARISON:  CT chest 03/05/2020 FINDINGS: Cardiovascular:  Satisfactory opacification of the pulmonary arteries to the segmental level. Nonocclusive saddle embolus extending to the left lower lobe segmental and subsegmental levels as well as into the proximal right main pulmonary artery. Enlarged right heart with marked enlargement of the right atria. There is increased right to left ventricular ratio of at least 1.5. Retrograde reflux of intravenous contrast into a dilated inferior vena cava and hepatic veins. The main pulmonary artery is enlarged measuring up to 3.7 cm. No significant pericardial effusion. The thoracic aorta is normal in caliber. No atherosclerotic plaque of the thoracic aorta. No coronary artery calcifications. Mediastinum/Nodes: No enlarged mediastinal, hilar, or axillary lymph nodes. Thyroid gland, trachea, and esophagus demonstrate no significant findings. Lungs/Pleura: Severe centrilobular and paraseptal emphysematous changes. Scattered areas of reticulations, scarring, and bronchiectasis. No focal consolidation. 0.8 x 0.4 cm right middle lobe pulmonary nodule. Adjacent 0.7 x 0.6 cm ground-glass subpleural nodule (6:49). 0.5 x 0.5 cm left upper lobe pulmonary nodule (6:33). Left upper lobe pulmonary micronodule (6:27). Several other scattered pulmonary micronodules. No pulmonary mass. No pleural effusion. No pneumothorax. Upper Abdomen: Please see separately dictated CT abdomen pelvis 03/06/2022 Musculoskeletal: Subcutaneus soft tissue edema. No suspicious lytic or blastic osseous lesions. No acute displaced fracture. Multilevel degenerative changes of the spine. Chronic appearing minimal vertebral body height loss of the L4-L5 level. Review of the MIP images confirms the above findings. IMPRESSION: 1. Nonocclusive saddle pulmonary embolus extending to the left lower lobe segmental and subsegmental levels as well as into the proximal right main pulmonary artery. Associated right heart strain. No pulmonary infarction. 2.  Emphysema (ICD10-J43.9) -  severe. 3. Scattered pulmonary micronodules as well as a 0.8 x 0.4 cm right middle lobe pulmonary nodule and adjacent 0.7 x 0.6 cm ground-glass subpleural nodule. Initial follow-up with CT at 6 months is recommended to confirm persistence. If persistent, repeat CT is recommended every 2 years until 5 years of stability has been established. This recommendation follows the consensus statement: Guidelines for Management of Incidental Pulmonary Nodules Detected on CT Images: From the Fleischner Society 2017; Radiology 2017; 284:228-243. These results were called by  telephone at the time of interpretation on 03/06/2022 at 3:21 pm to provider [facility] , who verbally acknowledged these results. Electronically Signed   By: Iven Finn M.D.   On: 03/06/2022 15:32   CT Abdomen Pelvis W Contrast  Result Date: 03/06/2022 CLINICAL DATA:  Abdominal pain EXAM: CT ABDOMEN AND PELVIS WITH CONTRAST TECHNIQUE: Multidetector CT imaging of the abdomen and pelvis was performed using the standard protocol following bolus administration of intravenous contrast. RADIATION DOSE REDUCTION: This exam was performed according to the departmental dose-optimization program which includes automated exposure control, adjustment of the mA and/or kV according to patient size and/or use of iterative reconstruction technique. CONTRAST:  39m OMNIPAQUE IOHEXOL 350 MG/ML SOLN COMPARISON:  None Available. FINDINGS: Lower chest: Centrilobular and panlobular emphysema is seen in the lower lung fields. Prominence of interstitial markings in the lower lung fields suggest scarring. Heart is enlarged in size. Hepatobiliary: Liver measures 18.3 cm in length. There is fatty infiltration. There is no dilation of bile ducts. Small amount of fluid adjacent to the gallbladder may be part of ascites. There is no significant wall thickening in gallbladder. Gallbladder is not distended. Pancreas: No focal abnormalities are seen. Spleen: Spleen is not seen.  Surgical clips on the left hemidiaphragms suggest possible previous splenectomy. Adrenals/Urinary Tract: There is mild hyperplasia of left adrenal. There is no hydronephrosis. There are no renal or ureteral stones. Urinary bladder is not distended. Stomach/Bowel: Stomach is not distended. Small bowel loops are not dilated. Appendix is not seen. There is no significant wall thickening in colon. Vascular/Lymphatic: Unremarkable. Reproductive: Unremarkable. Other: Small ascites is present. There is no pneumoperitoneum. Left inguinal hernia containing fat is seen. There is subcutaneous edema suggesting anasarca. Musculoskeletal: Slight decrease in height of upper end plate of body of L4 vertebra may be residual from previous injury. Degenerative changes are noted in the lumbar spine, more so at L4-L5 and L5-S1 levels. There is minimal retrolisthesis at L4-L5 level. IMPRESSION: There is no evidence of intestinal obstruction or pneumoperitoneum. There is no hydronephrosis. Enlarged fatty liver. Small ascites. Severe COPD. Lumbar spondylosis. Electronically Signed   By: PElmer PickerM.D.   On: 03/06/2022 15:23   DG Chest 2 View  Result Date: 03/06/2022 CLINICAL DATA:  Shortness of breath. EXAM: CHEST - 2 VIEW COMPARISON:  CT scan of March 05, 2020. FINDINGS: Mild cardiomegaly is noted. Emphysematous disease is noted throughout both lungs as well as coarse reticular opacities concerning for scarring or fibrosis. No definite acute abnormality is noted. Bony thorax is unremarkable. IMPRESSION: Emphysematous changes noted bilaterally as well as coarse reticular opacities bilaterally concerning for fibrosis or scarring. No definite acute abnormality is noted. Aortic Atherosclerosis (ICD10-I70.0) and Emphysema (ICD10-J43.9). Electronically Signed   By: JMarijo ConceptionM.D.   On: 03/06/2022 14:05      ASSESSMENT/PLAN   Acute on chronic hypoxia; multifactorial, came in with bilateral pulmonary eeboli, in a lady  who was not very active and significant underlying medical problems including; CTD, pulmonary fibrosis, pulmonary hypertension, severe copd with bullae and ground glass infiltrates. May concern is there also an IPF flare. Thank God she survived the blood clots to the lung   Bilateral pulmonary emboli. S/p mechanical thromboembolectomy. She is on anticoagulation- Eliquis. Base on her overall condition, limited ambulation, high risk of repeat clots, we discussed  most likely will keep her on it indefinitely. 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 However, the  increase ground glass infiltrate. ? Underlying IPF flare. Agree with resuming  mycophenolate 500 mg p.o. twice daily and solumedrol 50 mg iv q 12 hrs, lasix as you doing. 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 Will discuss with rheum ? Adding esbriet.  Pulmonary hypertension. 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 Hence on adcirca. 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 Will add letaris and if need be tyvaso Copd, with bullae dz. Stable No acute bronchospasm. Duo neg q 6 hr while awake. Incentive spirometry Scattered micro nodules bilateral, 0.8 RML nodule, 0.7 cm LUL ground class nodule, will continue surveillance on the out side,anca, ace pending.   Discuss d/c plans with attending     Thank you for allowing me to participate in the care of this patient.   Patient/Family are satisfied with care plan and all questions have been answered.  This document was prepared using Dragon voice recognition software and may include unintentional dictation errors.     Wallene Huh, M.D.  Division of [place]

## 2022-03-14 NOTE — Assessment & Plan Note (Signed)
status post Plaquenil, prednisone, Imuran  Currently on mycophenolate 500 mg p.o. twice daily.  Solu-Medrol 80 mg IV daily

## 2022-03-14 NOTE — Assessment & Plan Note (Signed)
RVC systolic function reduced and dilated RA. Echo Concerning for CHF/HFpEF on exam but findings have been stable past few days   Lasix 60 mg IV x1 given 08/14  CXR given cough to eval for pneumonia - no infectious process, no significant edema  Pulmonology following, he has started patient on Ambrisentan and tadalafil   Continue Lasix 40 mg po bid, strict I&O

## 2022-03-14 NOTE — Assessment & Plan Note (Signed)
   Vascular surgery following,s/p thrombectomy 03/07/2022  heparin GTT --> transitioned to oral Eliquis now  Echocardiogram --> LVEF 65 to 70%, normal function, normal diastolic parameters.  Right ventricular systolic function mildly reduced, moderately increased RV wall thickness, severely elevated pulmonary artery systolic pressure, right atrial size severely dilated.  DVT negative.

## 2022-03-14 NOTE — Progress Notes (Addendum)
Progress Note   Patient: Tammy Barrett XNT:[id number] DOB: 1961/03/11 DOA: 03/06/2022     8 DOS: the patient was seen and examined on 03/14/2022   Brief hospital course: [name] is a 61 year old female with hypertension, neuropathy, GERD, pulmonary hypertension, scleroderma, COPD, hepatosteatosis, mixed connective tissue disease, long-term use of immunosuppressant medication, pulmonary fibrosis, who presents to the emergency department 03/06/2022 for chief concerns of poor appetite. 08/10: Initial vitals heart rate of 103, blood pressure 123/103, SpO2 of 72% on room air with improvement to 95% on 5 L nasal cannula. BNP was elevated at 1738.4, high sensitive troponin was 40, creatinine 1.24.  No concerns on CT abdomen/pelvis.  CTA chest (+)Nonocclusive saddle pulmonary embolus extending to the left lower lobe segmental and subsegmental levels as well as into the proximal right main pulmonary artery. Associated right heart strain. No pulmonary infarction.  Severe emphysema, multiple pulmonary nodules to follow-up outpatient. Heparin GTT per pharmacy was initiated.  Patient was admitted to hospitalist service stepdown status with vascular consult for saddle PE. EDP had already consulted vascular who recommended heparin bolus and patient to be taken to the OR for thrombectomy in the a.m.  08/11: thrombectomy planned. I was contacted by radiologist Dr. Denna Haggard this morning and due to abnormal appearance of presumed clot and no evidence for DVT as well as significantly dilated RA, radiology is recommending CT chest with contrast to evaluate for potential mediastinal mass as opposed to pulmonary embolus, prior to taking her for thrombectomy.  Confirmed PE.  Also noted 2.3 cm right hilar lymph node, possible inflammatory, recommending follow-up CT with IV contrast 2 to 3 months. Echocardiogram --> LVEF 65 to 70%, normal function, normal diastolic parameters.  Right ventricular systolic function mildly  reduced, moderately increased RV wall thickness, severely elevated pulmonary artery systolic pressure, right atrial size severely dilated.  Underwent thrombolysis/thrombectomy.  08/12: Remains on heparin drip.  HFNC - requiring 10 L/min this morning but improved to 5 L/min later in the day. Pt reports SOB is overall better. Transferred to progressive status.  08/13: still requiring 6 /min O2. D/c heparin gtt 48 hours after thrombectomy and transition Eliquis. RN voiced concern low UOP, continue strict I&O and encouraged po intake  08/14: requiring 8 L/min overnight and maintaining on 5 L/min O2 this morning, no output documented from yesterday. Rales/coarse breath sounds and JVD --> Lasix 60 mg IV x1 and restart home lasix. CXR obtained to futher eval possible underlying pneumonia - images reviewed, stable cardiomegaly and severe background emphysema no new consolidations.  08/15: paged pulmonary Dr [name] 10:53 AM for routine consult. Pt appears fluid overloaded based on LE edema, rales, JVD but given pulm HTN and underlying lung disease will also seek pulmonary recs if any other interventions might be helpful. Continue lasix for now and strict I&O, daily weights.  08/16: still needing 9L/min, confirmed pulmonary to see her today.  8/17: On 8 L nasal cannula oxygen.  Pulmonary following 8/18: on high flow with FiO2 of 35% and tolerating well with sat in mid-high 90's   Assessment and Plan: * Pulmonary emboli ([name]) Vascular surgery following,s/p thrombectomy 03/07/2022 heparin GTT --> transitioned to oral Eliquis now Echocardiogram --> LVEF 65 to 70%, normal function, normal diastolic parameters.  Right ventricular systolic function mildly reduced, moderately increased RV wall thickness, severely elevated pulmonary artery systolic pressure, right atrial size severely dilated. DVT negative.  Protein-calorie malnutrition, severe Complicates overall prognosis.  (HFpEF) heart failure with preserved  ejection fraction ([place]) 03/14/22 evidenced  by rales, LE edema, JVD 08/11 Echocardiogram --> LVEF 65 to 70%, normal function, normal diastolic parameters.  Right ventricular systolic function mildly reduced, moderately increased RV wall thickness, severely elevated pulmonary artery systolic pressure, right atrial size severely dilated. Continue Lasix 40 mg p.o. twice daily CXR no significant pulmonary edema Pulmonology following  I's and O's does not seem accurate.  I have reached out to nurse for accurate I's and O's  Lymphadenopathy, mediastinal 2.3 cm right hilar lymph node, possible inflammatory, noted on CT chest 03/07/22  Outpatient follow-up CT with IV contrast 2 to 3 months  Emphysema, spiromety in office is c/w mild obstruction ([place]) Extensive lung disease, see above for pulmonary fibrosis On IV Solu-Medrol and nebulizer along with inhalers No home inhalers Not compliant w/ home O2 Pulmonology following DuoNeb prn   Acute on chronic respiratory failure with hypoxia ([place]) Secondary to pulmonary emboli Patient at baseline wears 3 L oxygen supplementation as needed for emphysema  Continue oxygen supplementation to maintain SpO2 greater than 92% Respiratory was able to transition her to high flow 35% FiO2 as nasal cannula 8 to 9 L was burning her nose Current continue Lasix 40 mg p.o. twice daily Pulmonology following  Net IO Since Admission: 2,902.96 mL [03/14/22 1652]   Her I's and O's does not seem accurate  Mixed connective tissue disease ([name]) status post Plaquenil, prednisone, Imuran Currently on mycophenolate 500 mg p.o. twice daily.  Solu-Medrol 80 mg IV daily  Pulmonary hypertension ([name]) RVC systolic function reduced and dilated RA. Echo Concerning for CHF/HFpEF on exam but findings have been stable past few days  Lasix 60 mg IV x1 given 08/14 CXR given cough to eval for pneumonia - no infectious process, no significant edema Pulmonology following, he has  started patient on Ambrisentan and tadalafil  Continue Lasix 40 mg po bid, strict I&O  Pulmonary fibrosis ([place]) Pulmonary hypertension Pulmonology following Continue home tadalafil 20 mg daily.        Subjective: Seen in the chair complaining of her nose burning when she was on nasal cannula oxygen and agreeable to high flow   Physical Exam: Vitals:   03/14/22 0859 03/14/22 1046 03/14/22 1159 03/14/22 1624  BP: (!) 129/97  119/77 109/72  Pulse: 91  (!) 103 (!) 106  Resp: _0 Temp: 98.1 F (36.7 C)  98.7 F (37.1 C) 98.5 F (36.9 C)  TempSrc: Oral     SpO2: (!) 88% 99% 90% (!) 81%  Weight:      Height:       61 year old female sitting in the chair having minimal struggle breathing Lungs Rales and crackles throughout both lungs Cardiovascular regular rate and rhythm Abdomen soft, benign Neuro alert and awake, nonfocal Psychiatry normal mood and affect Data Reviewed:  Potassium 5.2  Family Communication: left voice mail for Sister. Couldn't get in touch  Disposition: Status is: Inpatient Remains inpatient appropriate because: Still very high oxygen requirement   Planned Discharge Destination: Skilled nursing facility    DVT prophylaxis-Eliquis Time spent: 35 minutes  Author: Max Sane, MD 03/14/2022 4:53 PM  For on call review [email or web address].

## 2022-03-14 NOTE — Progress Notes (Signed)
Physical Therapy Treatment Patient Details Name: Tammy Barrett MRN: [id number] DOB: January 09, 1961 Today's Date: 03/14/2022   History of Present Illness Pt is a 61 y.o. female presenting to hospital 03/06/22 with c/o decreased appetite, some difficulty swallowing, and leg swelling.  Pt admitted with PE (large saddle pulmonary embolism with R heart strain), and acute on chronic respiratory failure with hypoxia.  S/p thrombectomy of saddle PE 03/07/22.  PMH includes htn, lupus, scleroderma, pulmonary fibrosis, pulmonary htn, mixed connective tissue disease, esophageal dysmotility, COPD, HSV, peripheral neuropathy, and Raynaud's disease.  O2 use as needed.    PT Comments    Pt resting in recliner upon PT arrival; agreeable to PT activities.  During session pt CGA with transfers and taking steps in place (x10 reps B LE's) with UE support on RW.  Pt requiring sitting rest breaks between activities d/t SOB and O2 desaturation.  O2 sats 88-89% on 32 L/min HFNC at rest beginning/end of session and O2 sats decreased to 80% with activity requiring a few minutes to rest and recover (focusing on pursed lip breathing to increase O2 sats)--nurse notified.  Impaired activity tolerance and generalized weakness noted.  Will continue to focus on strengthening, endurance, balance, and progressive functional mobility during hospitalization.    Recommendations for follow up therapy are one component of a multi-disciplinary discharge planning process, led by the attending physician.  Recommendations may be updated based on patient status, additional functional criteria and insurance authorization.  Follow Up Recommendations  Skilled nursing-short term rehab (<3 hours/day) Can patient physically be transported by private vehicle: Yes   Assistance Recommended at Discharge Frequent or constant Supervision/Assistance  Patient can return home with the following A little help with walking and/or transfers;A little help with  bathing/dressing/bathroom;Assistance with cooking/housework;Assist for transportation;Help with stairs or ramp for entrance   Equipment Recommendations  Rolling walker (2 wheels);BSC/3in1    Recommendations for Other Services OT consult     Precautions / Restrictions Precautions Precautions: Fall Precaution Comments: Mod Fall Risk Restrictions Weight Bearing Restrictions: No     Mobility  Bed Mobility               General bed mobility comments: Deferred (pt in recliner beginning/end of session)    Transfers Overall transfer level: Needs assistance Equipment used: Rolling walker (2 wheels) Transfers: Sit to/from Stand Sit to Stand: Min guard           General transfer comment: x1 trial; x5 trials sit to stand with RW use; vc's for UE/LE placement    Ambulation/Gait Ambulation/Gait assistance: Min guard Gait Distance (Feet):  (pt took 10 steps in place with B LE's) Assistive device: Rolling walker (2 wheels)   Gait velocity: decreased     General Gait Details: steady with RW use; decreased B LE foot clearance   Stairs             Wheelchair Mobility    Modified Rankin (Stroke Patients Only)       Balance Overall balance assessment: Needs assistance Sitting-balance support: No upper extremity supported, Feet supported Sitting balance-Leahy Scale: Good Sitting balance - Comments: steady sitting reaching within BOS   Standing balance support: Bilateral upper extremity supported, During functional activity, Reliant on assistive device for balance Standing balance-Leahy Scale: Good Standing balance comment: steady taking steps in place with RW use                            Cognition Arousal/Alertness:  Awake/alert Behavior During Therapy: WFL for tasks assessed/performed Overall Cognitive Status: Within Functional Limits for tasks assessed                                          Exercises      General  Comments  Nursing cleared pt for participation in physical therapy.  Pt agreeable to PT session.      Pertinent Vitals/Pain Pain Assessment Pain Assessment: No/denies pain HR 103-110 bpm during sessions activities.    Home Living                          Prior Function            PT Goals (current goals can now be found in the care plan section) Acute Rehab PT Goals Patient Stated Goal: to go home PT Goal Formulation: With patient Time For Goal Achievement: 03/24/22 Potential to Achieve Goals: Good Progress towards PT goals: Progressing toward goals    Frequency    Min 2X/week      PT Plan Current plan remains appropriate    Co-evaluation              AM-PAC PT "6 Clicks" Mobility   Outcome Measure  Help needed turning from your back to your side while in a flat bed without using bedrails?: None Help needed moving from lying on your back to sitting on the side of a flat bed without using bedrails?: A Little Help needed moving to and from a bed to a chair (including a wheelchair)?: A Little Help needed standing up from a chair using your arms (e.g., wheelchair or bedside chair)?: A Little Help needed to walk in hospital room?: A Little Help needed climbing 3-5 steps with a railing? : A Lot 6 Click Score: 18    End of Session Equipment Utilized During Treatment: Oxygen (32 L/min via heated HFNC) Activity Tolerance: Patient tolerated treatment well Patient left: in chair;with call bell/phone within reach (no chair alarm in place upon PT arrival) Nurse Communication: Mobility status;Other (comment) (pt's O2 status) PT Visit Diagnosis: Unsteadiness on feet (R26.81);Muscle weakness (generalized) (M62.81);Other abnormalities of gait and mobility (R26.89)     Time: 4961-1643 PT Time Calculation (min) (ACUTE ONLY): 23 min  Charges:  $Therapeutic Exercise: 8-22 mins $Therapeutic Activity: 8-22 mins                     Leitha Bleak, PT 03/14/22,  1:42 PM

## 2022-03-14 NOTE — Plan of Care (Signed)
  Problem: Education: Goal: Knowledge of General Education information will improve Description: Including pain rating scale, medication(s)/side effects and non-pharmacologic comfort measures Outcome: Progressing   Problem: Health Behavior/Discharge Planning: Goal: Ability to manage health-related needs will improve Outcome: Progressing

## 2022-03-14 NOTE — Consult Note (Signed)
Pharmacy Consult for Pulmonary Hypertension Treatment   Indication - Initiation of PAH medication while hospitalized  Patient is 61 y.o. and will be newly started on Ambrisentan Milda Smart).   In order to use this medication as an inpatient, monitoring parameters per REMS requirements must be met.  Chronic therapy will under the supervision of [name] ([phone]) who is enrolled in the REMS program and is initiating therapy.  On admission pregnancy risk has been assessed and patient is postmenopausal and negative pregnancy test. Hepatic function has been evaluated. AST/ALT appropriate to continue medication at this time.      Latest Ref Rng & Units 03/13/2022    7:59 AM 03/06/2022    1:41 PM 03/06/2022   12:18 PM  Hepatic Function  Total Protein 6.5 - 8.1 g/dL 6.4   7.9   Albumin 3.5 - 5.0 g/dL 2.3   3.2   AST 15 - 41 U/L 27   40   ALT 0 - 44 U/L 13   14   Alk Phosphatase 38 - 126 U/L 309   424   Total Bilirubin 0.3 - 1.2 mg/dL 0.7   2.7   Bilirubin, Direct 0.0 - 0.2 mg/dL 0.3  0.9       The patient has been educated on Pregnancy Risk and Hepatotoxicity  and REMS form has been signed and submitted to REMS program.   Authorization code: [id number]. Exp 03/21/2022 if not used.   If any question arise or pregnancy is identified during hospitalization, contact for bosentan and macitentan: [phone]; ambrisentan: [phone].  Thank for you allowing [place] to participate in the care of this patient.

## 2022-03-14 NOTE — Assessment & Plan Note (Signed)
2.3 cm right hilar lymph node, possible inflammatory, noted on CT chest 03/07/22   Outpatient follow-up CT with IV contrast 2 to 3 months

## 2022-03-14 NOTE — Assessment & Plan Note (Signed)
03/14/22 evidenced by rales, LE edema, JVD 08/11 Echocardiogram --> LVEF 65 to 70%, normal function, normal diastolic parameters.  Right ventricular systolic function mildly reduced, moderately increased RV wall thickness, severely elevated pulmonary artery systolic pressure, right atrial size severely dilated.  Continue Lasix 40 mg p.o. twice daily  CXR no significant pulmonary edema  Pulmonology following  I's and O's does not seem accurate.  I have reached out to nurse for accurate I's and O's

## 2022-03-14 NOTE — Assessment & Plan Note (Addendum)
Secondary to pulmonary emboli Patient at baseline wears 3 L oxygen supplementation as needed for emphysema   Continue oxygen supplementation to maintain SpO2 greater than 92%  Respiratory was able to transition her to high flow 35% FiO2 as nasal cannula 8 to 9 L was burning her nose  Current continue Lasix 40 mg p.o. twice daily  Pulmonology following  Net IO Since Admission: 2,902.96 mL [03/14/22 1652]   Her I's and O's does not seem accurate

## 2022-03-14 NOTE — Progress Notes (Signed)
Palliative Care Progress Note, Assessment & Plan   Patient Name: Tammy Barrett       Date: 03/14/2022 DOB: Dec 23, 1960  Age: 61 y.o. MRN#: [id number] Attending Physician: Max Sane, MD Primary Care Physician: Dion Body, MD Admit Date: 03/06/2022  Reason for Consultation/Follow-up: Establishing goals of care  Subjective: Patient is moving from bedside commode back to the recliner as I enter the room.  She acknowledges my presence and is able to make her wishes known.  She is extremely short of breath but able to make one-word responses to me.  She is now on high flow nasal cannula which she says they have turned on not too long ago this morning.  She denies pain.  No family/friends present at bedside.  HPI: 61 y.o. female  with past medical history of HTN, pulmonary HTN, COPD, scleroderma, Raynaud's disease, pulmonary fibrosis, and neuropathy admitted on 03/06/2022 with shortness of breath with exertion.    Patient is being treated for large saddle pulmonary embolism with right heart strain.  8/11, patient underwent mechanical thromboembolectomy.  Pulmonary and palliative medicine has been consulted.   PMT was consulted to discuss goals of care.60 y.o. female  with past medical history of HTN, pulmonary HTN, COPD, scleroderma, Raynaud's disease, pulmonary fibrosis, and neuropathy admitted on 03/06/2022 with shortness of breath with exertion.    Patient is being treated for large saddle pulmonary embolism with right heart strain.  8/11, patient underwent mechanical thromboembolectomy.  Pulmonary and palliative medicine has been consulted.   PMT was consulted to discuss goals of care.61 y.o. female  with past medical history of HTN, pulmonary HTN, COPD, scleroderma, Raynaud's disease, pulmonary  fibrosis, and neuropathy admitted on 03/06/2022 with shortness of breath with exertion.    Patient is being treated for large saddle pulmonary embolism with right heart strain.  8/11, patient underwent mechanical thromboembolectomy.  Pulmonary and palliative medicine has been consulted.   PMT was consulted to discuss goals of care.  Summary of counseling/coordination of care: After reviewing the patient's chart and assessing the patient at bedside, I spoke with patient regards to patient's care planning documentation.  She was winded and shared she needed time to recover from moving back and forth to the bedside commode.  I left a MOST form and advanced directive paperwork for patient to review.  She shared she knows she needs to complete this but just does not want to think about it right now.  We discussed that patient can complete advance directives for healthcare anytime throughout her hospitalization.   Outpatient readjusts to recliner and ensure call light and other personal care items were within reach.  She appreciated my visit.  Full code remains.  Patient's sister would be her surrogate decision-maker if patient is unable to speak for herself.  Patient declines to further outline her goals/wishes as far as scope of treatments in the future.  Patient has PMT contact information and was encouraged to call with any future palliative needs.  PMT will shadow the patient's chart and reengage at patient/family's request, if goals change, or if patient's health declines.  Code Status: Full code  Prognosis: Unable to determine  Discharge Planning: To Be Determined  Physical Exam  Vitals reviewed.  Constitutional:      General: She is not in acute distress.    Appearance: Normal appearance. She is not ill-appearing.  HENT:     Head: Normocephalic.     Mouth/Throat:     Mouth: Mucous membranes are moist.  Eyes:     Pupils: Pupils are equal, round, and reactive to light.   Cardiovascular:     Rate and Rhythm: Normal rate.     Pulses: Normal pulses.  Pulmonary:     Comments: Dyspnea at rest Abdominal:     Palpations: Abdomen is soft.  Musculoskeletal:        General: Normal range of motion.  Skin:    General: Skin is warm and dry.  Neurological:     Mental Status: She is alert and oriented to person, place, and time.  Psychiatric:        Mood and Affect: Mood normal.        Behavior: Behavior normal.        Thought Content: Thought content normal.        Judgment: Judgment normal.             Palliative Assessment/Data: 60%    Total Time 25 minutes  Greater than 50%  of this time was spent counseling and coordinating care related to the above assessment and plan.  Thank you for allowing the Palliative Medicine Team to assist in the care of this patient.  [place] Ilsa Iha, FNP-BC Palliative Medicine Team Team Phone # [phone]

## 2022-03-15 DIAGNOSIS — I2602 Saddle embolus of pulmonary artery with acute cor pulmonale: Secondary | ICD-10-CM | POA: Diagnosis not present

## 2022-03-15 LAB — BASIC METABOLIC PANEL
Anion gap: 8 (ref 5–15)
BUN: 35 mg/dL — ABNORMAL HIGH (ref 6–20)
CO2: 28 mmol/L (ref 22–32)
Calcium: 8.2 mg/dL — ABNORMAL LOW (ref 8.9–10.3)
Chloride: 100 mmol/L (ref 98–111)
Creatinine, Ser: 0.74 mg/dL (ref 0.44–1.00)
GFR, Estimated: >60 mL/min (ref >60)
Glucose, Bld: 114 mg/dL — ABNORMAL HIGH (ref 70–99)
Potassium: 4.5 mmol/L (ref 3.5–5.1)
Sodium: 136 mmol/L (ref 135–145)

## 2022-03-15 LAB — CBC
HCT: 40.9 % (ref 36.0–46.0)
Hemoglobin: 13.4 g/dL (ref 12.0–15.0)
MCH: 30.9 pg (ref 26.0–34.0)
MCHC: 32.8 g/dL (ref 30.0–36.0)
MCV: 94.2 fL (ref 80.0–100.0)
Platelets: 291 10*3/uL (ref 150–400)
RBC: 4.34 MIL/uL (ref 3.87–5.11)
RDW: 17.4 % — ABNORMAL HIGH (ref 11.5–15.5)
WBC: 5 10*3/uL (ref 4.0–10.5)
nRBC: 0 % (ref 0.0–0.2)

## 2022-03-15 LAB — FUNGITELL, SERUM

## 2022-03-15 MED ORDER — DM-GUAIFENESIN ER 30-600 MG PO TB12
1.0000 | ORAL_TABLET | Freq: Two times a day (BID) | ORAL | Status: DC
  Administered 2022-03-15 – 2022-03-24 (×19): 1 via ORAL
  Filled 2022-03-15 (×20): qty 1

## 2022-03-15 NOTE — Plan of Care (Signed)
  Problem: Health Behavior/Discharge Planning: Goal: Ability to manage health-related needs will improve Outcome: Progressing   Problem: Clinical Measurements: Goal: Ability to maintain clinical measurements within normal limits will improve Outcome: Progressing

## 2022-03-15 NOTE — Progress Notes (Signed)
Pulmonary Medicine          Date: 03/15/2022,   MRN# [id number] Tammy Barrett 08/28/1960       HISTORY OF PRESENT ILLNESS   In bed side chair, still quite dyspneic, fio2 up to 45 %, leg edema is down. Tolerating meds. Less nasal drying and burning. No fever or chills, no sweats. No calf pain.   PAST MEDICAL HISTORY   Past Medical History:  Diagnosis Date   COPD (chronic obstructive pulmonary disease) ([name])    Discoid lupus    Eczema    Esophageal dysmotility    Genital herpes    GERD (gastroesophageal reflux disease)    HSV (herpes simplex virus) infection    Hypertension    Lumbago    Lumbar disc disease    Peripheral neuropathy    Pulmonary fibrosis ([place])    Pulmonary hypertension ([name])    Raynaud's disease    Sclerodactyly    Scleroderma ([place])    Spinal stenosis      SURGICAL HISTORY   Past Surgical History:  Procedure Laterality Date   bone removal from pinkie toe     COLONOSCOPY WITH PROPOFOL N/A 08/30/2021   Procedure: COLONOSCOPY WITH PROPOFOL;  Surgeon: [name], MD;  Location: [facility] ENDOSCOPY;  Service: Gastroenterology;  Laterality: N/A;   DILATION AND CURETTAGE OF UTERUS     PULMONARY THROMBECTOMY N/A 03/07/2022   Procedure: PULMONARY THROMBECTOMY;  Surgeon: Algernon Huxley, MD;  Location: [place] [facility];  Service: Cardiovascular;  Laterality: N/A;   RIGHT HEART CATH Right 09/17/2018   Procedure: RIGHT HEART CATH;  Surgeon: Teodoro Spray, MD;  Location: [place] [facility];  Service: Cardiovascular;  Laterality: Right;   SPLENECTOMY     TUBAL LIGATION       FAMILY HISTORY   Family History  Problem Relation Age of Onset   Uterine cancer Mother    Stroke Father    Breast cancer Neg Hx      SOCIAL HISTORY   Social History   Tobacco Use   Smoking status: Former    Packs/day: 0.50    Types: Cigarettes    Quit date: 01/08/2011    Years since quitting: 11.1   Smokeless tobacco: Never  Vaping Use   Vaping Use: Never used   Substance Use Topics   Alcohol use: No   Drug use: No     MEDICATIONS    Home Medication:    Current Medication:  Current Facility-Administered Medications:    ambrisentan (LETAIRIS) tablet 5 mg, 5 mg, Oral, Daily, [name], Yamilee Harmes E, MD, 5 mg at 03/15/22 1226   amLODipine (NORVASC) tablet 10 mg, 10 mg, Oral, Daily, Dew, Erskine Squibb, MD, 10 mg at 03/15/22 0917   apixaban (ELIQUIS) tablet 10 mg, 10 mg, Oral, BID, 10 mg at 03/15/22 0917 **FOLLOWED BY** [START ON 03/16/2022] apixaban (ELIQUIS) tablet 5 mg, 5 mg, Oral, BID, [name], [facility]   Chlorhexidine Gluconate Cloth 2 % PADS 6 each, 6 each, Topical, Q0600, Algernon Huxley, MD, 6 each at 03/15/22 0646   dextromethorphan-guaiFENesin (MUCINEX DM) 30-600 MG per 12 hr tablet 1 tablet, 1 tablet, Oral, BID, Manuella Ghazi, Pratik D, DO, 1 tablet at 03/15/22 1226   feeding supplement (ENSURE ENLIVE / ENSURE PLUS) liquid 237 mL, 237 mL, Oral, TID BM, [name], [name], DO, 237 mL at 03/15/22 1226   fluticasone (FLONASE) 50 MCG/ACT nasal spray 1 spray, 1 spray, Each Nare, Daily PRN, Dew, Erskine Squibb, MD, 1  spray at 03/12/22 2153   furosemide (LASIX) tablet 40 mg, 40 mg, Oral, BID, Emeterio Reeve, DO, 40 mg at 03/15/22 1610   gabapentin (NEURONTIN) tablet 600 mg, 600 mg, Oral, TID, Lucky Cowboy, Erskine Squibb, MD, 600 mg at 03/15/22 9604   hydrocortisone cream 1 %, , Topical, QID PRN, Emeterio Reeve, DO   HYDROmorphone (DILAUDID) injection 1 mg, 1 mg, Intravenous, Once PRN, Lucky Cowboy, Erskine Squibb, MD   ipratropium-albuterol (DUONEB) 0.5-2.5 (3) MG/3ML nebulizer solution 3 mL, 3 mL, Nebulization, Q4H PRN, Emeterio Reeve, DO   methylPREDNISolone sodium succinate (SOLU-MEDROL) 125 mg/2 mL injection 80 mg, 80 mg, Intravenous, Q24H, [name], Leeann Bady E, MD, 80 mg at 03/14/22 1936   multivitamin with minerals tablet 1 tablet, 1 tablet, Oral, Daily, Emeterio Reeve, DO, 1 tablet at 03/15/22 5409   mycophenolate (CELLCEPT) capsule 500 mg, 500 mg, Oral, BID, Algernon Huxley, MD, 500  mg at 03/15/22 8119   Oral care mouth rinse, 15 mL, Mouth Rinse, PRN, Sharion Settler, NP   pantoprazole (PROTONIX) EC tablet 40 mg, 40 mg, Oral, Daily, Dew, Erskine Squibb, MD, 40 mg at 03/15/22 1478   senna-docusate (Senokot-S) tablet 1 tablet, 1 tablet, Oral, QHS PRN, Algernon Huxley, MD, 1 tablet at 03/09/22 1347   sodium chloride (OCEAN) 0.65 % nasal spray 1 spray, 1 spray, Each Nare, PRN, Max Sane, MD   tadalafil (PAH) (ADCIRCA) tablet 20 mg, 20 mg, Oral, Daily, Dew, Erskine Squibb, MD, 20 mg at 03/15/22 0916    ALLERGIES   Hydroxychloroquine and Meclizine     REVIEW OF SYSTEMS    Review of Systems:  Gen:  Denies  fever, sweats, chills weigh loss  HEENT: Denies blurred vision, double vision, ear pain, eye pain, hearing loss, nose bleeds, sore throat Cardiac:  No dizziness, chest pain or heaviness, chest tightness,edema Resp:   ++ cough or sputum porduction, ++ shortness of breath, -wheezing, hemoptysis,  Gi: Denies swallowing difficulty, stomach pain, nausea or vomiting, diarrhea, constipation, bowel incontinence Gu:  Denies bladder incontinence, burning urine Ext:   Denies Joint pain, stiffness,  + swelling Skin: Denies  skin rash, easy bruising or bleeding or hives Endoc:  Denies polyuria, polydipsia , polyphagia or weight change Psych:   Denies depression, insomnia or hallucinations   Other:  All other systems negative   VS: BP 113/78 (BP Location: Left Arm)   Pulse 94   Temp 97.6 F (36.4 C) (Oral)   Resp 18   Ht _0  (1.651 m)   Wt 68.4 kg   SpO2 97%   BMI 25.11 kg/m      PHYSICAL EXAM    GENERAL:NAD, no fevers, chills, no weakness no fatigue HEAD: Normocephalic, atraumatic.  EYES: Pupils equal, round, reactive to light. Extraocular muscles intact. No scleral icterus.  MOUTH: Moist mucosal membrane. Dentition intact. No abscess noted.  EAR, NOSE, THROAT: Clear without exudates. No external lesions.  NECK: Supple. No thyromegaly. No nodules. No JVD.  PULMONARY:  bilateral crackless CARDIOVASCULAR: S1 and S2. Regular rate and rhythm. No murmurs, rubs, or gallops. No edema. Pedal pulses 2+ bilaterally.  GASTROINTESTINAL: Soft, nontender, nondistended. No masses. Positive bowel sounds. No hepatosplenomegaly.  MUSCULOSKELETAL: No swelling, clubbing, less edema. Range of motion full in all extremities.  NEUROLOGIC: Cranial nerves II through XII are intact. No gross focal neurological deficits. Sensation intact. Reflexes intact.  SKIN: No ulceration, lesions, rashes, or cyanosis. Skin warm and dry. Turgor intact.  PSYCHIATRIC: Mood, affect within normal limits. The patient is awake, alert and oriented  x 3. Insight, judgment intact.       IMAGING    DG Chest Port 1 View  Result Date: 03/10/2022 CLINICAL DATA:  Leg swelling EXAM: PORTABLE CHEST 1 VIEW COMPARISON:  Chest x-ray dated March 06, 2022; chest CT dated March 07, 2022 FINDINGS: Unchanged cardiomegaly. Severe background emphysema with areas of nodularity, better evaluated on recent chest CT. No new focal consolidation. The visualized skeletal structures are unremarkable. IMPRESSION: Severe emphysema with no new focal consolidation. Electronically Signed   By: Yetta Glassman M.D.   On: 03/10/2022 10:25   PERIPHERAL VASCULAR CATHETERIZATION  Result Date: 03/07/2022 See surgical note for result.  ECHOCARDIOGRAM COMPLETE  Result Date: 03/07/2022    ECHOCARDIOGRAM REPORT   Patient Name:   Tammy Barrett Date of Exam: 03/07/2022 Medical Rec #:  [id number]        Height:       65.0 in Accession #:    [id number]       Weight:       142.4 lb Date of Birth:  1961-02-05        BSA:          1.712 m Patient Age:    8 years         BP:           118/100 mmHg Patient Gender: F                HR:           95 bpm. Exam Location:  [facility] Procedure: 2D Echo, Cardiac Doppler and Color Doppler Indications:     Pulmonary Embolus I26.09  History:         Patient has no prior history of Echocardiogram examinations.                   COPD and Pulmonary HTN; Risk Factors:Hypertension.  Sonographer:     Sherrie Sport Referring Phys:  [id number] AMY N COX Diagnosing Phys: Serafina Royals MD IMPRESSIONS  1. Left ventricular ejection fraction, by estimation, is 65 to 70%. The left ventricle has normal function. The left ventricle has no regional wall motion abnormalities. Left ventricular diastolic parameters were normal.  2. Right ventricular systolic function is mildly reduced. The right ventricular size is severely enlarged. Moderately increased right ventricular wall thickness. There is severely elevated pulmonary artery systolic pressure.  3. Right atrial size was severely dilated.  4. The mitral valve is normal in structure. Trivial mitral valve regurgitation.  5. Tricuspid valve regurgitation is severe.  6. The aortic valve is normal in structure. Aortic valve regurgitation is trivial. FINDINGS  Left Ventricle: Left ventricular ejection fraction, by estimation, is 65 to 70%. The left ventricle has normal function. The left ventricle has no regional wall motion abnormalities. The left ventricular internal cavity size was small. There is no left ventricular hypertrophy. Left ventricular diastolic parameters were normal. Right Ventricle: The right ventricular size is severely enlarged. Moderately increased right ventricular wall thickness. Right ventricular systolic function is mildly reduced. There is severely elevated pulmonary artery systolic pressure. Left Atrium: Left atrial size was normal in size. Right Atrium: Right atrial size was severely dilated. Pericardium: There is no evidence of pericardial effusion. Mitral Valve: The mitral valve is normal in structure. Trivial mitral valve regurgitation. Tricuspid Valve: The tricuspid valve is normal in structure. Tricuspid valve regurgitation is severe. Aortic Valve: The aortic valve is normal in structure. Aortic valve regurgitation is trivial. Aortic valve mean gradient measures 2.0  mmHg. Aortic valve peak gradient measures 2.8 mmHg. Aortic valve area, by VTI measures 2.37 cm. Pulmonic Valve: The pulmonic valve was normal in structure. Pulmonic valve regurgitation is mild. Aorta: The aortic root and ascending aorta are structurally normal, with no evidence of dilitation. IAS/Shunts: No atrial level shunt detected by color flow Doppler.  LEFT VENTRICLE PLAX 2D LVIDd:         2.60 cm     Diastology LVIDs:         2.00 cm     LV e' medial:    5.87 cm/s LV PW:         1.20 cm     LV E/e' medial:  6.0 LV IVS:        1.20 cm     LV e' lateral:   11.30 cm/s LVOT diam:     2.00 cm     LV E/e' lateral: 3.1 LV SV:         25 LV SV Index:   15 LVOT Area:     3.14 cm  LV Volumes (MOD) LV vol d, MOD A4C: 17.0 ml LV vol s, MOD A4C: 10.5 ml LV SV MOD A4C:     17.0 ml RIGHT VENTRICLE RV Basal diam:  4.30 cm RV S prime:     7.72 cm/s TAPSE (M-mode): 1.6 cm LEFT ATRIUM             Index        RIGHT ATRIUM           Index LA diam:        3.40 cm 1.99 cm/m   RA Area:     42.90 cm LA Vol (A2C):   67.6 ml 39.48 ml/m  RA Volume:   218.00 ml 127.31 ml/m LA Vol (A4C):   17.7 ml 10.34 ml/m LA Biplane Vol: 35.7 ml 20.85 ml/m  AORTIC VALVE AV Area (Vmax):    2.32 cm AV Area (Vmean):   2.02 cm AV Area (VTI):     2.37 cm AV Vmax:           83.20 cm/s AV Vmean:          59.100 cm/s AV VTI:            0.106 m AV Peak Grad:      2.8 mmHg AV Mean Grad:      2.0 mmHg LVOT Vmax:         61.40 cm/s LVOT Vmean:        38.000 cm/s LVOT VTI:          0.080 m LVOT/AV VTI ratio: 0.75  AORTA Ao Root diam: 3.15 cm MITRAL VALVE               TRICUSPID VALVE MV Area (PHT): 8.92 cm    TR Peak grad:   74.0 mmHg MV Decel Time: 85 msec     TR Vmax:        430.00 cm/s MV E velocity: 35.10 cm/s MV A velocity: 74.60 cm/s  SHUNTS MV E/A ratio:  0.47        Systemic VTI:  0.08 m                            Systemic Diam: 2.00 cm Serafina Royals MD Electronically signed by Serafina Royals MD Signature Date/Time: 03/07/2022/12:58:25 PM     Final    CT CHEST W CONTRAST  Result  Date: 03/07/2022 CLINICAL DATA:  Soft tissue mass. EXAM: CT CHEST WITH CONTRAST TECHNIQUE: Multidetector CT imaging of the chest was performed during intravenous contrast administration. RADIATION DOSE REDUCTION: This exam was performed according to the departmental dose-optimization program which includes automated exposure control, adjustment of the mA and/or kV according to patient size and/or use of iterative reconstruction technique. CONTRAST:  68m OMNIPAQUE IOHEXOL 300 MG/ML  SOLN COMPARISON:  CTA of the chest of March 06, 2022. FINDINGS: Cardiovascular: As noted on the exam performed yesterday, there is again noted large saddle embolus extending from proximal right pulmonary artery and through left pulmonary artery into the left lower lobe branches. This is not significantly changed compared to yesterday. Atherosclerosis of thoracic aorta is noted without aneurysm or dissection. No pericardial effusion is noted. Mild cardiomegaly is noted. Mediastinum/Nodes: Thyroid gland is unremarkable. The esophagus appears normal. 2.3 cm right hilar lymph node is noted. Lungs/Pleura: No pneumothorax or pleural effusion is noted. Emphysematous disease and scarring is noted throughout both lungs. Stable 5 mm nodule is noted in right lower lobe best seen on image number 73 of series 3. Stable 3 mm nodule is noted in left upper lobe best seen on image number 39 of series 3. Stable 3 mm nodules also seen in left upper lobe best seen on image number 49 of series 3. Upper Abdomen: No acute abnormality. Musculoskeletal: No chest wall abnormality. No acute or significant osseous findings. IMPRESSION: There is stable large pulmonary embolus extending primarily through the left pulmonary artery and into the left lower lobe branches as described on prior exam. 2.3 cm right hilar lymph node is noted which may be inflammatory in etiology, but neoplasm cannot be excluded. Follow-up CT scan with  intravenous contrast is recommended in 2-3 months to evaluate stability. Extensive emphysematous disease and scarring is noted throughout both lungs as described on prior exam. Multiple pulmonary nodules are again noted as described on prior exam performed yesterday, with the largest measuring 5 mm in the right lower lobe. No follow-up needed if patient is low-risk (and has no known or suspected primary neoplasm). Non-contrast chest CT can be considered in 12 months if patient is high-risk. This recommendation follows the consensus statement: Guidelines for Management of Incidental Pulmonary Nodules Detected on CT Images: From the Fleischner Society 2017; Radiology 2017; 284:228-243. Aortic Atherosclerosis (ICD10-I70.0) and Emphysema (ICD10-J43.9). Electronically Signed   By: JMarijo ConceptionM.D.   On: 03/07/2022 11:14   [place]Venous Img Lower Bilateral (DVT)  Result Date: 03/07/2022 CLINICAL DATA:  Patient with pulmonary embolism.  Evaluate for DVT. EXAM: BILATERAL LOWER EXTREMITY VENOUS DOPPLER ULTRASOUND TECHNIQUE: Gray-scale sonography with graded compression, as well as color Doppler and duplex ultrasound were performed to evaluate the lower extremity deep venous systems from the level of the common femoral vein and including the common femoral, femoral, profunda femoral, popliteal and calf veins including the posterior tibial, peroneal and gastrocnemius veins when visible. The superficial great saphenous vein was also interrogated. Spectral Doppler was utilized to evaluate flow at rest and with distal augmentation maneuvers in the common femoral, femoral and popliteal veins. COMPARISON:  None Available. FINDINGS: RIGHT LOWER EXTREMITY Common Femoral Vein: No evidence of thrombus. Normal compressibility, respiratory phasicity and response to augmentation. Saphenofemoral Junction: No evidence of thrombus. Normal compressibility and flow on color Doppler imaging. Profunda Femoral Vein: No evidence of thrombus.  Normal compressibility and flow on color Doppler imaging. Femoral Vein: No evidence of thrombus. Normal compressibility, respiratory phasicity and response to augmentation.  Popliteal Vein: No evidence of thrombus. Normal compressibility, respiratory phasicity and response to augmentation. Calf Veins: No evidence of thrombus. Normal compressibility and flow on color Doppler imaging. Other Findings:  None. LEFT LOWER EXTREMITY Common Femoral Vein: No evidence of thrombus. Normal compressibility, respiratory phasicity and response to augmentation. Saphenofemoral Junction: No evidence of thrombus. Normal compressibility and flow on color Doppler imaging. Profunda Femoral Vein: No evidence of thrombus. Normal compressibility and flow on color Doppler imaging. Femoral Vein: No evidence of thrombus. Normal compressibility, respiratory phasicity and response to augmentation. Popliteal Vein: No evidence of thrombus. Normal compressibility, respiratory phasicity and response to augmentation. Calf Veins: No evidence of thrombus. Normal compressibility and flow on color Doppler imaging. Other Findings:  None. IMPRESSION: No evidence of deep venous thrombosis in either lower extremity. Electronically Signed   By: Markus Daft M.D.   On: 03/07/2022 07:29   CT Angio Chest PE W/Cm &/Or Wo Cm  Result Date: 03/06/2022 CLINICAL DATA:  CT Abdomen Pelvis W Contrast suspected, high prob Abdominal pain, acute, nonlocalized EXAM: CT ANGIOGRAPHY CHEST WITH CONTRAST TECHNIQUE: Multidetector CT imaging of the chest was performed using the standard protocol during bolus administration of intravenous contrast. Multiplanar CT image reconstructions and MIPs were obtained to evaluate the vascular anatomy. RADIATION DOSE REDUCTION: This exam was performed according to the departmental dose-optimization program which includes automated exposure control, adjustment of the mA and/or kV according to patient size and/or use of iterative reconstruction  technique. CONTRAST:  63m OMNIPAQUE IOHEXOL 350 MG/ML SOLN COMPARISON:  CT chest 03/05/2020 FINDINGS: Cardiovascular: Satisfactory opacification of the pulmonary arteries to the segmental level. Nonocclusive saddle embolus extending to the left lower lobe segmental and subsegmental levels as well as into the proximal right main pulmonary artery. Enlarged right heart with marked enlargement of the right atria. There is increased right to left ventricular ratio of at least 1.5. Retrograde reflux of intravenous contrast into a dilated inferior vena cava and hepatic veins. The main pulmonary artery is enlarged measuring up to 3.7 cm. No significant pericardial effusion. The thoracic aorta is normal in caliber. No atherosclerotic plaque of the thoracic aorta. No coronary artery calcifications. Mediastinum/Nodes: No enlarged mediastinal, hilar, or axillary lymph nodes. Thyroid gland, trachea, and esophagus demonstrate no significant findings. Lungs/Pleura: Severe centrilobular and paraseptal emphysematous changes. Scattered areas of reticulations, scarring, and bronchiectasis. No focal consolidation. 0.8 x 0.4 cm right middle lobe pulmonary nodule. Adjacent 0.7 x 0.6 cm ground-glass subpleural nodule (6:49). 0.5 x 0.5 cm left upper lobe pulmonary nodule (6:33). Left upper lobe pulmonary micronodule (6:27). Several other scattered pulmonary micronodules. No pulmonary mass. No pleural effusion. No pneumothorax. Upper Abdomen: Please see separately dictated CT abdomen pelvis 03/06/2022 Musculoskeletal: Subcutaneus soft tissue edema. No suspicious lytic or blastic osseous lesions. No acute displaced fracture. Multilevel degenerative changes of the spine. Chronic appearing minimal vertebral body height loss of the L4-L5 level. Review of the MIP images confirms the above findings. IMPRESSION: 1. Nonocclusive saddle pulmonary embolus extending to the left lower lobe segmental and subsegmental levels as well as into the proximal  right main pulmonary artery. Associated right heart strain. No pulmonary infarction. 2.  Emphysema (ICD10-J43.9) - severe. 3. Scattered pulmonary micronodules as well as a 0.8 x 0.4 cm right middle lobe pulmonary nodule and adjacent 0.7 x 0.6 cm ground-glass subpleural nodule. Initial follow-up with CT at 6 months is recommended to confirm persistence. If persistent, repeat CT is recommended every 2 years until 5 years of stability has been established. This recommendation  follows the consensus statement: Guidelines for Management of Incidental Pulmonary Nodules Detected on CT Images: From the Fleischner Society 2017; Radiology 2017; 284:228-243. These results were called by telephone at the time of interpretation on 03/06/2022 at 3:21 pm to provider [facility] , who verbally acknowledged these results. Electronically Signed   By: Iven Finn M.D.   On: 03/06/2022 15:32   CT Abdomen Pelvis W Contrast  Result Date: 03/06/2022 CLINICAL DATA:  Abdominal pain EXAM: CT ABDOMEN AND PELVIS WITH CONTRAST TECHNIQUE: Multidetector CT imaging of the abdomen and pelvis was performed using the standard protocol following bolus administration of intravenous contrast. RADIATION DOSE REDUCTION: This exam was performed according to the departmental dose-optimization program which includes automated exposure control, adjustment of the mA and/or kV according to patient size and/or use of iterative reconstruction technique. CONTRAST:  30m OMNIPAQUE IOHEXOL 350 MG/ML SOLN COMPARISON:  None Available. FINDINGS: Lower chest: Centrilobular and panlobular emphysema is seen in the lower lung fields. Prominence of interstitial markings in the lower lung fields suggest scarring. Heart is enlarged in size. Hepatobiliary: Liver measures 18.3 cm in length. There is fatty infiltration. There is no dilation of bile ducts. Small amount of fluid adjacent to the gallbladder may be part of ascites. There is no significant wall thickening in  gallbladder. Gallbladder is not distended. Pancreas: No focal abnormalities are seen. Spleen: Spleen is not seen. Surgical clips on the left hemidiaphragms suggest possible previous splenectomy. Adrenals/Urinary Tract: There is mild hyperplasia of left adrenal. There is no hydronephrosis. There are no renal or ureteral stones. Urinary bladder is not distended. Stomach/Bowel: Stomach is not distended. Small bowel loops are not dilated. Appendix is not seen. There is no significant wall thickening in colon. Vascular/Lymphatic: Unremarkable. Reproductive: Unremarkable. Other: Small ascites is present. There is no pneumoperitoneum. Left inguinal hernia containing fat is seen. There is subcutaneous edema suggesting anasarca. Musculoskeletal: Slight decrease in height of upper end plate of body of L4 vertebra may be residual from previous injury. Degenerative changes are noted in the lumbar spine, more so at L4-L5 and L5-S1 levels. There is minimal retrolisthesis at L4-L5 level. IMPRESSION: There is no evidence of intestinal obstruction or pneumoperitoneum. There is no hydronephrosis. Enlarged fatty liver. Small ascites. Severe COPD. Lumbar spondylosis. Electronically Signed   By: PElmer PickerM.D.   On: 03/06/2022 15:23   DG Chest 2 View  Result Date: 03/06/2022 CLINICAL DATA:  Shortness of breath. EXAM: CHEST - 2 VIEW COMPARISON:  CT scan of March 05, 2020. FINDINGS: Mild cardiomegaly is noted. Emphysematous disease is noted throughout both lungs as well as coarse reticular opacities concerning for scarring or fibrosis. No definite acute abnormality is noted. Bony thorax is unremarkable. IMPRESSION: Emphysematous changes noted bilaterally as well as coarse reticular opacities bilaterally concerning for fibrosis or scarring. No definite acute abnormality is noted. Aortic Atherosclerosis (ICD10-I70.0) and Emphysema (ICD10-J43.9). Electronically Signed   By: JMarijo ConceptionM.D.   On: 03/06/2022 14:05    Anca  neg  ASSESSMENT/PLAN   Acute on chronic hypoxia; multifactorial, came in with bilateral pulmonary eeboli, in a lady who was not very active and significant underlying medical problems including; CTD, pulmonary fibrosis, pulmonary hypertension, severe copd with bullae and ground glass infiltrates. May concern is there also an IPF flare. Thank God she survived the blood clots to the lung   Bilateral pulmonary emboli. S/p mechanical thromboembolectomy. She is on anticoagulation- Eliquis. Base on her overall condition, limited ambulation, high risk of repeat clots, we discussed  most likely will keep her on it indefinitely. 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 However, the increase ground glass infiltrate. ? Underlying IPF flare. Agree with resuming  mycophenolate 500 mg p.o. twice daily and solumedrol 50 mg iv q 12 hrs, lasix as you doing. 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 Will discuss with rheum ? Adding esbriet.  Pulmonary hypertension. 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 Hence on adcirca. 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 Will add letaris and if need be tyvaso Copd, with bullae dz. Stable No acute bronchospasm. Duo neg q 6 hr while awake. Incentive spirometry Scattered micro nodules bilateral, 0.8 RML nodule, 0.7 cm LUL ground class nodule, will continue surveillance on the out side  Thank you for allowing me to participate in the care of this patient.   Patient/Family are satisfied with care plan and all questions have been answered.  This document was prepared using Dragon voice recognition software and may include unintentional dictation errors.     Wallene Huh, M.D.  Division of [place]

## 2022-03-15 NOTE — Progress Notes (Signed)
HFNC adjusted to 40 lpm & 45% due to desat into 80's on previous setting

## 2022-03-15 NOTE — Progress Notes (Signed)
61 y o f FC alert and oriented this shift. Entry point in Right groin is intact. This shift she is currently on Heated HFNC at 40 L/min at 45% due to Oxygen desaturation to 85% with exertion to the [facility]. Prior to this she was on [place] at [place]. Patient is DVT negative and is currently waiting for SNF placement. Peripheral IV dressing change completed for Left and Right Antecubital 22 G PIV. Patient had a bowel movement this shift.

## 2022-03-15 NOTE — Progress Notes (Addendum)
PROGRESS NOTE    IVEE POELLNITZ  JSE:[id number] DOB: March 19, 1961 DOA: 03/06/2022 PCP: Dion Body, MD   Brief Narrative:  Ms. Nataly Pacifico is a 61 year old female with hypertension, neuropathy, GERD, pulmonary hypertension, scleroderma, COPD, hepatosteatosis, mixed connective tissue disease, long-term use of immunosuppressant medication, pulmonary fibrosis, who presents to the emergency department 03/06/2022 for chief concerns of poor appetite. 08/10: Initial vitals heart rate of 103, blood pressure 123/103, SpO2 of 72% on room air with improvement to 95% on 5 L nasal cannula. BNP was elevated at 1738.4, high sensitive troponin was 40, creatinine 1.24.  No concerns on CT abdomen/pelvis.  CTA chest (+)Nonocclusive saddle pulmonary embolus extending to the left lower lobe segmental and subsegmental levels as well as into the proximal right main pulmonary artery. Associated right heart strain. No pulmonary infarction.  Severe emphysema, multiple pulmonary nodules to follow-up outpatient. Heparin GTT per pharmacy was initiated.  Patient was admitted to hospitalist service stepdown status with vascular consult for saddle PE. EDP had already consulted vascular who recommended heparin bolus and patient to be taken to the OR for thrombectomy in the a.m.  08/11: thrombectomy planned. I was contacted by radiologist Dr. Denna Haggard this morning and due to abnormal appearance of presumed clot and no evidence for DVT as well as significantly dilated RA, radiology is recommending CT chest with contrast to evaluate for potential mediastinal mass as opposed to pulmonary embolus, prior to taking her for thrombectomy.  Confirmed PE.  Also noted 2.3 cm right hilar lymph node, possible inflammatory, recommending follow-up CT with IV contrast 2 to 3 months. Echocardiogram --> LVEF 65 to 70%, normal function, normal diastolic parameters.  Right ventricular systolic function mildly reduced, moderately increased RV wall  thickness, severely elevated pulmonary artery systolic pressure, right atrial size severely dilated.  Underwent thrombolysis/thrombectomy.  08/12: Remains on heparin drip.  HFNC - requiring 10 L/min this morning but improved to 5 L/min later in the day. Pt reports SOB is overall better. Transferred to progressive status.  08/13: still requiring 6 /min O2. D/c heparin gtt 48 hours after thrombectomy and transition Eliquis. RN voiced concern low UOP, continue strict I&O and encouraged po intake  08/14: requiring 8 L/min overnight and maintaining on 5 L/min O2 this morning, no output documented from yesterday. Rales/coarse breath sounds and JVD --> Lasix 60 mg IV x1 and restart home lasix. CXR obtained to futher eval possible underlying pneumonia - images reviewed, stable cardiomegaly and severe background emphysema no new consolidations.  08/15: paged pulmonary Dr [name] 10:53 AM for routine consult. Pt appears fluid overloaded based on LE edema, rales, JVD but given pulm HTN and underlying lung disease will also seek pulmonary recs if any other interventions might be helpful. Continue lasix for now and strict I&O, daily weights.  08/16: still needing 9L/min, confirmed pulmonary to see her today.  8/17: On 8 L nasal cannula oxygen.  Pulmonary following 8/18: on high flow with FiO2 of 35% and tolerating well with sat in mid-high 90's 8/19: Continues on high flow with FiO2 45%.  Continues to have cough with whitish sputum production.    Assessment & Plan:   Principal Problem:   Pulmonary emboli ([name]) Active Problems:   Essential hypertension   Pulmonary fibrosis ([name])   Pulmonary hypertension ([name])   Mixed connective tissue disease ([name])   GERD (gastroesophageal reflux disease)   Acute on chronic respiratory failure with hypoxia ([name])   Emphysema, spiromety in office is c/w mild obstruction ([name])   Lymphadenopathy, mediastinal  Pressure injury of skin   (HFpEF) heart failure with preserved  ejection fraction ([name])   Protein-calorie malnutrition, severe  Assessment and Plan: * Pulmonary emboli ([place]) Vascular surgery following,s/p thrombectomy 03/07/2022 heparin GTT --> transitioned to oral Eliquis now Echocardiogram --> LVEF 65 to 70%, normal function, normal diastolic parameters.  Right ventricular systolic function mildly reduced, moderately increased RV wall thickness, severely elevated pulmonary artery systolic pressure, right atrial size severely dilated. DVT negative.  Protein-calorie malnutrition, severe Complicates overall prognosis.  (HFpEF) heart failure with preserved ejection fraction ([place]) 03/14/22 evidenced by rales, LE edema, JVD 08/11 Echocardiogram --> LVEF 65 to 70%, normal function, normal diastolic parameters.  Right ventricular systolic function mildly reduced, moderately increased RV wall thickness, severely elevated pulmonary artery systolic pressure, right atrial size severely dilated. Continue Lasix 40 mg p.o. twice daily CXR no significant pulmonary edema Pulmonology following  I's and O's does not seem accurate.  I have reached out to nurse for accurate I's and O's  Lymphadenopathy, mediastinal 2.3 cm right hilar lymph node, possible inflammatory, noted on CT chest 03/07/22  Outpatient follow-up CT with IV contrast 2 to 3 months  Emphysema, spiromety in office is c/w mild obstruction ([place]) Extensive lung disease, see above for pulmonary fibrosis On IV Solu-Medrol and nebulizer along with inhalers No home inhalers Not compliant w/ home O2 Pulmonology following DuoNeb prn   Acute on chronic respiratory failure with hypoxia ([place]) Secondary to pulmonary emboli Patient at baseline wears 3 L oxygen supplementation as needed for emphysema  Continue oxygen supplementation to maintain SpO2 greater than 92% Respiratory was able to transition her to high flow 35% FiO2 as nasal cannula 8 to 9 L was burning her nose Current continue Lasix 40 mg p.o.  twice daily Pulmonology following  Net IO Since Admission: 2,902.96 mL [03/14/22 1652]   Her I's and O's does not seem accurate  Mixed connective tissue disease ([name]) status post Plaquenil, prednisone, Imuran Currently on mycophenolate 500 mg p.o. twice daily.  Solu-Medrol 80 mg IV daily  Pulmonary hypertension ([name]) RVC systolic function reduced and dilated RA. Echo Concerning for CHF/HFpEF on exam but findings have been stable past few days  Lasix 60 mg IV x1 given 08/14 CXR given cough to eval for pneumonia - no infectious process, no significant edema Pulmonology following, he has started patient on Ambrisentan and tadalafil  Continue Lasix 40 mg po bid, strict I&O  Pulmonary fibrosis ([place]) Pulmonary hypertension Pulmonology following Continue home tadalafil 20 mg daily.    DVT prophylaxis: Eliquis Code Status: Full Family Communication: Discussed with sister on phone 8/19 Disposition Plan: Continue to wean oxygen requirements prior to discharge Status is: Inpatient Remains inpatient appropriate because: Need for ongoing IV medications.   Nutritional Assessment:  The patient's BMI is: Body mass index is 25.11 kg/m.[name]  Seen by dietician.  I agree with the assessment and plan as outlined below:  Nutrition Status: Nutrition Problem: Severe Malnutrition Etiology: chronic illness (COPD, scleroderma) Signs/Symptoms: moderate fat depletion, severe fat depletion, moderate muscle depletion, severe muscle depletion Interventions: Ensure Enlive (each supplement provides 350kcal and 20 grams of protein), MVI  .    Skin Assessment:  I have examined the patient's skin and I agree with the wound assessment as performed by the wound care RN as outlined below:  Pressure Injury 03/08/22 Groin Anterior;Proximal;Right Stage 2 -  Partial thickness loss of dermis presenting as a shallow open injury with a red, pink wound bed without slough. 3.5 cm X 3.5 cm X  0 open area at right  groin vascular access site (Active)  03/08/22 1930  Location: Groin  Location Orientation: Anterior;Proximal;Right  Staging: Stage 2 -  Partial thickness loss of dermis presenting as a shallow open injury with a red, pink wound bed without slough.  Wound Description (Comments): 3.5 cm X 3.5 cm X 0 open area at right groin vascular access site  Present on Admission: No  Dressing Type Gauze (Comment) 03/15/22 0802    Consultants:  Pulmonology Palliative care  Procedures:  See above  Antimicrobials:  Anti-infectives (From admission, onward)    Start     Dose/Rate Route Frequency Ordered Stop   03/07/22 1432  ceFAZolin (ANCEF) IVPB 1 g/50 mL premix  Status:  Discontinued        over 30 Minutes  Continuous PRN 03/07/22 1439 03/07/22 1536   03/07/22 0804  ceFAZolin (ANCEF) IVPB 2g/100 mL premix  Status:  Discontinued        2 g 200 mL/hr over 30 Minutes Intravenous 30 min pre-op 03/07/22 0804 03/07/22 1536      Subjective: Patient seen and evaluated today with requesting something to help with her cough. No acute concerns or events noted overnight.  Objective: Vitals:   03/14/22 2355 03/15/22 0340 03/15/22 0759 03/15/22 0830  BP:  110/81 113/81   Pulse:  96 91   Resp:  20 18   Temp:  97.6 F (36.4 C) 98.8 F (37.1 C)   TempSrc:   Oral   SpO2: 94% 97% 97% 98%  Weight:      Height:        Intake/Output Summary (Last 24 hours) at 03/15/2022 1057 Last data filed at 03/15/2022 0344 Gross per 24 hour  Intake 240 ml  Output 400 ml  Net -160 ml   Filed Weights   03/06/22 1700 03/07/22 1216 03/10/22 1743  Weight: 64.6 kg 63.5 kg 68.4 kg    Examination:  General exam: Appears calm and comfortable  Respiratory system: Clear to auscultation. Respiratory effort normal.  Currently on high flow nasal cannula oxygen FiO2 45%. Cardiovascular system: S1 & S2 heard, RRR.  Gastrointestinal system: Abdomen is soft Central nervous system: Alert and awake Extremities: No  edema Skin: No significant lesions noted Psychiatry: Flat affect.    Data Reviewed: I have personally reviewed following labs and imaging studies  CBC: Recent Labs  Lab 03/09/22 0530 03/10/22 0308 03/13/22 0442 03/14/22 0431 03/15/22 0700  WBC 5.8 5.8 4.2 6.9 5.0  HGB 14.6 13.5 13.8 14.0 13.4  HCT 45.9 41.8 41.2 42.5 40.9  MCV 99.4 96.3 93.8 94.4 94.2  PLT 104* 146* 216 260 939   Basic Metabolic Panel: Recent Labs  Lab 03/11/22 0441 03/12/22 0523 03/13/22 0442 03/14/22 0431 03/15/22 0700  NA 133* 136 134* 135 136  K 4.4 4.5 4.6 5.2* 4.5  CL 105 105 102 103 100  CO2 _0 GLUCOSE 92 105* 186* 131* 114*  BUN 30* 29* 28* 31* 35*  CREATININE 0.94 0.96 0.82 0.69 0.74  CALCIUM 8.0* 8.1* 8.2* 8.5* 8.2*   GFR: Estimated Creatinine Clearance: 72.7 mL/min (by C-G formula based on SCr of 0.74 mg/dL). Liver Function Tests: Recent Labs  Lab 03/13/22 0759  AST 27  ALT 13  ALKPHOS 309*  BILITOT 0.7  PROT 6.4*  ALBUMIN 2.3*   No results for input(s): "LIPASE", "AMYLASE" in the last 168 hours. No results for input(s): "AMMONIA" in the last 168 hours. Coagulation Profile: No results for input(s): "  INR", "PROTIME" in the last 168 hours. Cardiac Enzymes: No results for input(s): "CKTOTAL", "CKMB", "CKMBINDEX", "TROPONINI" in the last 168 hours. BNP (last 3 results) No results for input(s): "PROBNP" in the last 8760 hours. HbA1C: No results for input(s): "HGBA1C" in the last 72 hours. CBG: Recent Labs  Lab 03/08/22 1201 03/08/22 1631  GLUCAP 135* 126*   Lipid Profile: No results for input(s): "CHOL", "HDL", "LDLCALC", "TRIG", "CHOLHDL", "LDLDIRECT" in the last 72 hours. Thyroid Function Tests: No results for input(s): "TSH", "T4TOTAL", "FREET4", "T3FREE", "THYROIDAB" in the last 72 hours. Anemia Panel: No results for input(s): "VITAMINB12", "FOLATE", "FERRITIN", "TIBC", "IRON", "RETICCTPCT" in the last 72 hours. Sepsis Labs: No results for input(s):  "PROCALCITON", "LATICACIDVEN" in the last 168 hours.  Recent Results (from the past 240 hour(s))  MRSA Next Gen by PCR, Nasal     Status: None   Collection Time: 03/06/22  5:07 PM   Specimen: Nasal Mucosa; Nasal Swab  Result Value Ref Range Status   MRSA by PCR Next Gen NOT DETECTED NOT DETECTED Final    Comment: (NOTE) The GeneXpert MRSA Assay (FDA approved for NASAL specimens only), is one component of a comprehensive MRSA colonization surveillance program. It is not intended to diagnose MRSA infection nor to guide or monitor treatment for MRSA infections. Test performance is not FDA approved in patients less than 50 years old. Performed at [facility], [address], [place], [place] [address]          Radiology Studies: No results found.      Scheduled Meds:  ambrisentan  5 mg Oral Daily   amLODipine  10 mg Oral Daily   apixaban  10 mg Oral BID   Followed by   Derrill Memo ON 03/16/2022] apixaban  5 mg Oral BID   Chlorhexidine Gluconate Cloth  6 each Topical Q0600   dextromethorphan-guaiFENesin  1 tablet Oral BID   feeding supplement  237 mL Oral TID BM   furosemide  40 mg Oral BID   gabapentin  600 mg Oral TID   methylPREDNISolone (SOLU-MEDROL) injection  80 mg Intravenous Q24H   multivitamin with minerals  1 tablet Oral Daily   mycophenolate  500 mg Oral BID   pantoprazole  40 mg Oral Daily   tadalafil (PAH)  20 mg Oral Daily     LOS: 9 days    Time spent: 35 minutes    Keyoni Lapinski Darleen Crocker, DO [facility]  If 7PM-7AM, please contact night-coverage [email or web address] 03/15/2022, 10:57 AM

## 2022-03-16 DIAGNOSIS — I5032 Chronic diastolic (congestive) heart failure: Secondary | ICD-10-CM | POA: Diagnosis not present

## 2022-03-16 DIAGNOSIS — I2602 Saddle embolus of pulmonary artery with acute cor pulmonale: Secondary | ICD-10-CM | POA: Diagnosis not present

## 2022-03-16 DIAGNOSIS — J9621 Acute and chronic respiratory failure with hypoxia: Secondary | ICD-10-CM | POA: Diagnosis not present

## 2022-03-16 DIAGNOSIS — J439 Emphysema, unspecified: Secondary | ICD-10-CM | POA: Diagnosis not present

## 2022-03-16 DIAGNOSIS — E43 Unspecified severe protein-calorie malnutrition: Secondary | ICD-10-CM

## 2022-03-16 NOTE — Assessment & Plan Note (Signed)
Seems stable.  Pulmonology following, he has started patient on Ambrisentan and tadalafil   Continue Lasix 40 mg po bid, strict I&O

## 2022-03-16 NOTE — Assessment & Plan Note (Signed)
Pulmonary hypertension  Pulmonology following  Continue home tadalafil 20 mg daily 

## 2022-03-16 NOTE — Assessment & Plan Note (Signed)
Complicates overall prognosis. -Dietitian consult

## 2022-03-16 NOTE — Progress Notes (Signed)
Occupational Therapy Treatment Patient Details Name: Tammy Barrett MRN: [id number] DOB: Jun 24, 1961 Today's Date: 03/16/2022   History of present illness Pt is a 61 y.o. female presenting to hospital 03/06/22 with c/o decreased appetite, some difficulty swallowing, and leg swelling.  Pt admitted with PE (large saddle pulmonary embolism with R heart strain), and acute on chronic respiratory failure with hypoxia.  S/p thrombectomy of saddle PE 03/07/22.  PMH includes htn, lupus, scleroderma, pulmonary fibrosis, pulmonary htn, mixed connective tissue disease, esophageal dysmotility, COPD, HSV, peripheral neuropathy, and Raynaud's disease.  O2 use as needed.   OT comments  Pt seen for OT tx this date. Pt pleasant, agreeable, and denies complaints. Up in recliner. Pt completed ADL transfers from recliner with RW requiring supervision. Pt tolerated standing within RW frame with tray table at counter height for grooming tasks in standing with setup and supervision for safety. SpO2 remained 90-92% while standing for grooming tasks, HR low 100's. Pt educated in activity pacing and benefits of rest breaks to support safety/indep with ADL/mobility. Pt marched in place ~62mn without LOB or SOB, HR 102, SpO2 95% on 40L O2. Pt endorsing 6-7/10 PRE with all activity. RN notified. Pt progressing well towards goals. Continues to require high level of O2. Continue to recommend SNF at this time but hopeful to update d/c recommendation as pt weans from O2 and able to tolerate more activity.    Recommendations for follow up therapy are one component of a multi-disciplinary discharge planning process, led by the attending physician.  Recommendations may be updated based on patient status, additional functional criteria and insurance authorization.    Follow Up Recommendations  Skilled nursing-short term rehab (<3 hours/day)    Assistance Recommended at Discharge Intermittent Supervision/Assistance  Patient can return home  with the following  A little help with walking and/or transfers;A little help with bathing/dressing/bathroom;Assistance with cooking/housework;Help with stairs or ramp for entrance;Assist for transportation   Equipment Recommendations  Tub/shower seat    Recommendations for Other Services      Precautions / Restrictions Precautions Precautions: Fall Precaution Comments: Mod Fall Risk Restrictions Weight Bearing Restrictions: No       Mobility Bed Mobility               General bed mobility comments: Deferred (pt in recliner beginning/end of session)    Transfers Overall transfer level: Needs assistance Equipment used: Rolling walker (2 wheels) Transfers: Sit to/from Stand Sit to Stand: Supervision                 Balance Overall balance assessment: Needs assistance Sitting-balance support: No upper extremity supported, Feet supported Sitting balance-Leahy Scale: Good Sitting balance - Comments: no LOB during grooming tasks standing without UE support   Standing balance support: No upper extremity supported, Single extremity supported, During functional activity Standing balance-Leahy Scale: Fair                             ADL either performed or assessed with clinical judgement   ADL Overall ADL's : Needs assistance/impaired     Grooming: Wash/dry face;Oral care;Standing;Set up;Supervision/safety Grooming Details (indicate cue type and reason): Pt completed grooming tasks standing within RW frame with tray table at counter height due to constraints in O2 lines/leads. Supv for safety when bending slightly over table to rinse/spit after brushing teeth.  Extremity/Trunk Assessment              Vision       Perception     Praxis      Cognition Arousal/Alertness: Awake/alert Behavior During Therapy: WFL for tasks assessed/performed Overall Cognitive Status: Within Functional Limits  for tasks assessed                                          Exercises Other Exercises Other Exercises: Pt educated in activity pacing and benefits of rest breaks to support safety/indep with ADL/mobility. Pt marched in place ~36mn without LOB or SOB, HR 102, SpO2 95% on 40L O2. SpO2 remained 90-92% while standing for grooming tasks, HR low 100's. Pt endorsing 6-7/10 PRE.    Shoulder Instructions       General Comments      Pertinent Vitals/ Pain       Pain Assessment Pain Assessment: No/denies pain  Home Living                                          Prior Functioning/Environment              Frequency  Min 2X/week        Progress Toward Goals  OT Goals(current goals can now be found in the care plan section)  Progress towards OT goals: Progressing toward goals  Acute Rehab OT Goals Patient Stated Goal: to get stronger OT Goal Formulation: With patient Time For Goal Achievement: 03/24/22 Potential to Achieve Goals: Good  Plan Discharge plan remains appropriate;Frequency remains appropriate    Co-evaluation                 AM-PAC OT "6 Clicks" Daily Activity     Outcome Measure   Help from another person eating meals?: None Help from another person taking care of personal grooming?: A Little Help from another person toileting, which includes using toliet, bedpan, or urinal?: A Little Help from another person bathing (including washing, rinsing, drying)?: A Little Help from another person to put on and taking off regular upper body clothing?: A Little Help from another person to put on and taking off regular lower body clothing?: A Little 6 Click Score: 19    End of Session Equipment Utilized During Treatment: Rolling walker (2 wheels);Oxygen  OT Visit Diagnosis: Unsteadiness on feet (R26.81);Muscle weakness (generalized) (M62.81)   Activity Tolerance Patient tolerated treatment well   Patient Left in  chair;with call bell/phone within reach   Nurse Communication          Time: [id number] Time Calculation (min): 20 min  Charges: OT General Charges $OT Visit: 1 Visit OT Treatments $Self Care/Home Management : 8-22 mins  JArdeth Perfect, MPH, MS, OTR/L ascom [phone]/20/23, 10:27 AM

## 2022-03-16 NOTE — Assessment & Plan Note (Signed)
03/16/22 evidenced by rales, LE edema, JVD 08/11 Echocardiogram --> LVEF 65 to 70%, normal function, normal diastolic parameters.  Right ventricular systolic function mildly reduced, moderately increased RV wall thickness, severely elevated pulmonary artery systolic pressure, right atrial size severely dilated.  Continue Lasix 40 mg p.o. twice daily  CXR no significant pulmonary edema  Pulmonology following

## 2022-03-16 NOTE — Assessment & Plan Note (Signed)
Secondary to pulmonary emboli Patient at baseline wears 3 L oxygen supplementation as needed for emphysema   Continue oxygen supplementation to maintain SpO2 greater than 92%  Able to transition her to a liter of oxygen  Current continue Lasix 40 mg p.o. twice daily  Pulmonology following

## 2022-03-16 NOTE — Plan of Care (Signed)
  Problem: Education: Goal: Knowledge of General Education information will improve Description: Including pain rating scale, medication(s)/side effects and non-pharmacologic comfort measures Outcome: Progressing

## 2022-03-16 NOTE — Plan of Care (Signed)
  Problem: Education: Goal: Knowledge of General Education information will improve Description: Including pain rating scale, medication(s)/side effects and non-pharmacologic comfort measures 03/16/2022 1039 by Emmaline Life, RN Outcome: Progressing 03/16/2022 1037 by Emmaline Life, RN Outcome: Progressing   Problem: Health Behavior/Discharge Planning: Goal: Ability to manage health-related needs will improve Outcome: Progressing

## 2022-03-16 NOTE — Assessment & Plan Note (Signed)
   status post Plaquenil, prednisone, Imuran  Currently on mycophenolate 500 mg p.o. twice daily.  Solu-Medrol 80 mg IV daily

## 2022-03-16 NOTE — Assessment & Plan Note (Signed)
   S/p mechanical thrombectomy by vascular surgery.  Initially treated with heparin now transition to Eliquis.  Echocardiogram --> LVEF 65 to 70%, normal function, normal diastolic parameters.  Right ventricular systolic function mildly reduced, moderately increased RV wall thickness, severely elevated pulmonary artery systolic pressure, right atrial size severely dilated.  DVT negative.

## 2022-03-16 NOTE — Progress Notes (Signed)
Progress Note   Patient: Tammy Barrett HEN:[id number] DOB: 1961/07/07 DOA: 03/06/2022     10 DOS: the patient was seen and examined on 03/16/2022   Brief hospital course: [name] is a 61 year old female with hypertension, neuropathy, GERD, pulmonary hypertension, scleroderma, COPD, hepatosteatosis, mixed connective tissue disease, long-term use of immunosuppressant medication, pulmonary fibrosis, who presents to the emergency department 03/06/2022 for chief concerns of poor appetite. Tammy Barrett 08/10: Initial vitals heart rate of 103, blood pressure 123/103, SpO2 of 72% on room air with improvement to 95% on 5 L nasal cannula. BNP was elevated at 1738.4, high sensitive troponin was 40, creatinine 1.24.  No concerns on CT abdomen/pelvis.  CTA chest (+)Nonocclusive saddle pulmonary embolus extending to the left lower lobe segmental and subsegmental levels as well as into the proximal right main pulmonary artery. Associated right heart strain. No pulmonary infarction.  Severe emphysema, multiple pulmonary nodules to follow-up outpatient. Heparin GTT per pharmacy was initiated.  Patient was admitted to hospitalist service stepdown status with vascular consult for saddle PE. EDP had already consulted vascular who recommended heparin bolus and patient to be taken to the OR for thrombectomy in the a.m.  . 08/11: thrombectomy planned. I was contacted by radiologist Dr. Denna Haggard this morning and due to abnormal appearance of presumed clot and no evidence for DVT as well as significantly dilated RA, radiology is recommending CT chest with contrast to evaluate for potential mediastinal mass as opposed to pulmonary embolus, prior to taking her for thrombectomy.  Confirmed PE.  Also noted 2.3 cm right hilar lymph node, possible inflammatory, recommending follow-up CT with IV contrast 2 to 3 months. Echocardiogram --> LVEF 65 to 70%, normal function, normal diastolic parameters.  Right ventricular systolic function  mildly reduced, moderately increased RV wall thickness, severely elevated pulmonary artery systolic pressure, right atrial size severely dilated.  Underwent thrombolysis/thrombectomy.  Tammy Barrett 08/12: Remains on heparin drip.  HFNC - requiring 10 L/min this morning but improved to 5 L/min later in the day. Pt reports SOB is overall better. Transferred to progressive status.  Tammy Barrett 08/13: still requiring 6 /min O2. D/c heparin gtt 48 hours after thrombectomy and transition Eliquis. RN voiced concern low UOP, continue strict I&O and encouraged po intake  . 08/14: requiring 8 L/min overnight and maintaining on 5 L/min O2 this morning, no output documented from yesterday. Rales/coarse breath sounds and JVD --> Lasix 60 mg IV x1 and restart home lasix. CXR obtained to futher eval possible underlying pneumonia - images reviewed, stable cardiomegaly and severe background emphysema no new consolidations.  Tammy Barrett 08/15: paged pulmonary Dr [name] 10:53 AM for routine consult. Pt appears fluid overloaded based on LE edema, rales, JVD but given pulm HTN and underlying lung disease will also seek pulmonary recs if any other interventions might be helpful. Continue lasix for now and strict I&O, daily weights.  Tammy Barrett 08/16: still needing 9L/min, confirmed pulmonary to see her today.  . 8/17: On 8 L nasal cannula oxygen.  Pulmonary following . 8/18: on high flow with FiO2 of 35% and tolerating well with sat in mid-high 90's . 8/19: Continues on high flow with FiO2 45%.  Continues to have cough with whitish sputum production. . 8/20: Able to wean back to 8 L of oxygen.  Uses 3 L at baseline.  PT/OT are recommending SNF   Assessment and Plan: * Pulmonary emboli ([name])  S/p mechanical thrombectomy by vascular surgery.  Initially treated with heparin now transition to Eliquis.  Echocardiogram -->  LVEF 65 to 70%, normal function, normal diastolic parameters.  Right ventricular systolic function mildly reduced, moderately increased RV wall  thickness, severely elevated pulmonary artery systolic pressure, right atrial size severely dilated.  DVT negative.  Acute on chronic respiratory failure with hypoxia ([name]) Secondary to pulmonary emboli Patient at baseline wears 3 L oxygen supplementation as needed for emphysema   Continue oxygen supplementation to maintain SpO2 greater than 92%  Able to transition her to a liter of oxygen  Current continue Lasix 40 mg p.o. twice daily  Pulmonology following    Pulmonary fibrosis ([place]) Pulmonary hypertension  Pulmonology following  Continue home tadalafil 20 mg daily.  Pulmonary hypertension ([name]) Seems stable.  Pulmonology following, he has started patient on Ambrisentan and tadalafil   Continue Lasix 40 mg po bid, strict I&O  Mixed connective tissue disease ([name])  status post Plaquenil, prednisone, Imuran  Currently on mycophenolate 500 mg p.o. twice daily.  Solu-Medrol 80 mg IV daily  Lymphadenopathy, mediastinal 2.3 cm right hilar lymph node, possible inflammatory, noted on CT chest 03/07/22   Outpatient follow-up CT with IV contrast 2 to 3 months  (HFpEF) heart failure with preserved ejection fraction ([place]) 03/16/22 evidenced by rales, LE edema, JVD 08/11 Echocardiogram --> LVEF 65 to 70%, normal function, normal diastolic parameters.  Right ventricular systolic function mildly reduced, moderately increased RV wall thickness, severely elevated pulmonary artery systolic pressure, right atrial size severely dilated.  Continue Lasix 40 mg p.o. twice daily  CXR no significant pulmonary edema  Pulmonology following    Protein-calorie malnutrition, severe Complicates overall prognosis. -Dietitian consult  Emphysema, spiromety in office is c/w mild obstruction ([place]) Extensive lung disease, see above for pulmonary fibrosis On IV Solu-Medrol and nebulizer along with inhalers No home inhalers Not compliant w/ home O2  Pulmonology following  DuoNeb prn          Subjective: Patient was seen and examined today.  Sitting in chair comfortably.  No new complaints.  Physical Exam: Vitals:   03/16/22 0830 03/16/22 0833 03/16/22 1209 03/16/22 1500  BP: 112/87  105/75   Pulse: 80  99   Resp: 16  16   Temp: 97.8 F (36.6 C)  98.6 F (37 C)   TempSrc: Oral  Oral   SpO2: 98% 95% 97% 94%  Weight:      Height:       General.  Malnourished lady, in no acute distress. Pulmonary.  Bilateral crackles, normal respiratory effort. CV.  Regular rate and rhythm, no JVD, rub or murmur. Abdomen.  Soft, nontender, nondistended, BS positive. CNS.  Alert and oriented .  No focal neurologic deficit. Extremities.  2+ LE edema, no cyanosis, pulses intact and symmetrical. Psychiatry.  Judgment and insight appears normal.  Data Reviewed: Prior data reviewed.  Family Communication:   Disposition: Status is: Inpatient Remains inpatient appropriate because: Severity of illness   Planned Discharge Destination: Skilled nursing facility  DVT prophylaxis.  Eliquis Time spent: 50 minutes  This record has been created using Systems analyst. Errors have been sought and corrected,but may not always be located. Such creation errors do not reflect on the standard of care.  Author: Lorella Nimrod, MD 03/16/2022 3:59 PM  For on call review [email or web address].

## 2022-03-17 DIAGNOSIS — I2602 Saddle embolus of pulmonary artery with acute cor pulmonale: Secondary | ICD-10-CM | POA: Diagnosis not present

## 2022-03-17 LAB — ALPHA-1-ANTITRYPSIN PHENOTYP: A-1 Antitrypsin, Ser: 243 mg/dL — ABNORMAL HIGH (ref 101–187)

## 2022-03-17 MED ORDER — STERILE WATER FOR INJECTION IJ SOLN
INTRAMUSCULAR | Status: AC
  Filled 2022-03-17: qty 10

## 2022-03-17 NOTE — Progress Notes (Signed)
Progress Note   Patient: Tammy Barrett CVE:[id number] DOB: [birth year] DOA: 03/06/2022     11 DOS: the patient was seen and examined on 03/17/2022   Brief hospital course: [name] is a 61 year old female with hypertension, neuropathy, GERD, pulmonary hypertension, scleroderma, COPD, hepatosteatosis, mixed connective tissue disease, long-term use of immunosuppressant medication, pulmonary fibrosis, who presents to the emergency department 03/06/2022 for chief concerns of poor appetite. [name] 08/10: Initial vitals heart rate of 103, blood pressure 123/103, SpO2 of 72% on room air with improvement to 95% on 5 L nasal cannula. BNP was elevated at 1738.4, high sensitive troponin was 40, creatinine 1.24.  No concerns on CT abdomen/pelvis.  CTA chest (+)Nonocclusive saddle pulmonary embolus extending to the left lower lobe segmental and subsegmental levels as well as into the proximal right main pulmonary artery. Associated right heart strain. No pulmonary infarction.  Severe emphysema, multiple pulmonary nodules to follow-up outpatient. Heparin GTT per pharmacy was initiated.  Patient was admitted to hospitalist service stepdown status with vascular consult for saddle PE. EDP had already consulted vascular who recommended heparin bolus and patient to be taken to the OR for thrombectomy in the a.m.  . 08/11: thrombectomy planned. I was contacted by radiologist Dr. Denna Haggard this morning and due to abnormal appearance of presumed clot and no evidence for DVT as well as significantly dilated RA, radiology is recommending CT chest with contrast to evaluate for potential mediastinal mass as opposed to pulmonary embolus, prior to taking her for thrombectomy.  Confirmed PE.  Also noted 2.3 cm right hilar lymph node, possible inflammatory, recommending follow-up CT with IV contrast 2 to 3 months. Echocardiogram --> LVEF 65 to 70%, normal function, normal diastolic parameters.  Right ventricular systolic function  mildly reduced, moderately increased RV wall thickness, severely elevated pulmonary artery systolic pressure, right atrial size severely dilated.  Underwent thrombolysis/thrombectomy.  [name] 08/12: Remains on heparin drip.  HFNC - requiring 10 L/min this morning but improved to 5 L/min later in the day. Pt reports SOB is overall better. Transferred to progressive status.  [name] 08/13: still requiring 6 /min O2. D/c heparin gtt 48 hours after thrombectomy and transition Eliquis. RN voiced concern low UOP, continue strict I&O and encouraged po intake  . 08/14: requiring 8 L/min overnight and maintaining on 5 L/min O2 this morning, no output documented from yesterday. Rales/coarse breath sounds and JVD --> Lasix 60 mg IV x1 and restart home lasix. CXR obtained to futher eval possible underlying pneumonia - images reviewed, stable cardiomegaly and severe background emphysema no new consolidations.  [name] 08/15: paged pulmonary Dr [name] 10:53 AM for routine consult. Pt appears fluid overloaded based on LE edema, rales, JVD but given pulm HTN and underlying lung disease will also seek pulmonary recs if any other interventions might be helpful. Continue lasix for now and strict I&O, daily weights.  [name] 08/16: still needing 9L/min, confirmed pulmonary to see her today.  . 8/17: On 8 L nasal cannula oxygen.  Pulmonary following . 8/18: on high flow with FiO2 of 35% and tolerating well with sat in mid-high 90's . 8/19: Continues on high flow with FiO2 45%.  Continues to have cough with whitish sputum production. . 8/20: Able to wean back to 8 L of oxygen.  Uses 3 L at baseline.  PT/OT are recommending SNF . 8/21: Clinically seems stable, at 7 L of oxygen.   Assessment and Plan: * Pulmonary emboli ([name])  S/p mechanical thrombectomy by vascular surgery.  Initially  treated with heparin now transition to Eliquis.  Echocardiogram --> LVEF 65 to 70%, normal function, normal diastolic parameters.  Right ventricular systolic  function mildly reduced, moderately increased RV wall thickness, severely elevated pulmonary artery systolic pressure, right atrial size severely dilated.  DVT negative.  Acute on chronic respiratory failure with hypoxia ([name]) Secondary to pulmonary emboli Patient at baseline wears 3 L oxygen, currently on 7 L supplementation as needed for emphysema   Continue oxygen supplementation to maintain SpO2 greater than 92%  Try weaning as tolerated  Current continue Lasix 40 mg p.o. twice daily  Pulmonology following    Pulmonary fibrosis ([place]) Pulmonary hypertension  Pulmonology following  Continue home tadalafil 20 mg daily.  Pulmonary hypertension ([name]) Seems stable.  Pulmonology following, he has started patient on Ambrisentan and tadalafil   Continue Lasix 40 mg po bid, strict I&O  Mixed connective tissue disease ([name])  status post Plaquenil, prednisone, Imuran  Currently on mycophenolate 500 mg p.o. twice daily.  Solu-Medrol 80 mg IV daily  Lymphadenopathy, mediastinal 2.3 cm right hilar lymph node, possible inflammatory, noted on CT chest 03/07/22   Outpatient follow-up CT with IV contrast 2 to 3 months  (HFpEF) heart failure with preserved ejection fraction ([place]) 03/16/22 evidenced by rales, LE edema, JVD 08/11 Echocardiogram --> LVEF 65 to 70%, normal function, normal diastolic parameters.  Right ventricular systolic function mildly reduced, moderately increased RV wall thickness, severely elevated pulmonary artery systolic pressure, right atrial size severely dilated.  Continue Lasix 40 mg p.o. twice daily  CXR no significant pulmonary edema  Pulmonology following    Protein-calorie malnutrition, severe Complicates overall prognosis. -Dietitian consult  Emphysema, spiromety in office is c/w mild obstruction ([place]) Extensive lung disease, see above for pulmonary fibrosis On IV Solu-Medrol and nebulizer along with inhalers No home inhalers Not compliant  w/ home O2  Pulmonology following  DuoNeb prn      Subjective: Patient was sitting comfortably in chair when seen today.  Denies any complaints.  She wants to go to rehab.  Physical Exam: Vitals:   03/17/22 0359 03/17/22 0837 03/17/22 0907 03/17/22 1231  BP: 113/76 110/84  102/67  Pulse: 94 85  (!) 101  Resp: 16   20  Temp: 98.6 F (37 C) (!) 97.3 F (36.3 C)  98.7 F (37.1 C)  TempSrc: Oral Axillary  Oral  SpO2: 95% 96% 93% 91%  Weight:      Height:       General.  Well-developed lady, in no acute distress. Pulmonary.  Lungs clear bilaterally, normal respiratory effort. CV.  Regular rate and rhythm, no JVD, rub or murmur. Abdomen.  Soft, nontender, nondistended, BS positive. CNS.  Alert and oriented .  No focal neurologic deficit. Extremities.  1+ LE edema, no cyanosis, pulses intact and symmetrical. Psychiatry.  Judgment and insight appears normal.  Data Reviewed: Prior data reviewed  Family Communication: Discussed with patient  Disposition: Status is: Inpatient Remains inpatient appropriate because: Severity of illness   Planned Discharge Destination: Skilled nursing facility  DVT prophylaxis.  Eliquis Time spent: 45 minutes  This record has been created using Systems analyst. Errors have been sought and corrected,but may not always be located. Such creation errors do not reflect on the standard of care.  Author: Lorella Nimrod, MD 03/17/2022 3:31 PM  For on call review [email or web address].

## 2022-03-17 NOTE — Assessment & Plan Note (Signed)
   S/p mechanical thrombectomy by vascular surgery.  Initially treated with heparin now transition to Eliquis.  Echocardiogram --> LVEF 65 to 70%, normal function, normal diastolic parameters.  Right ventricular systolic function mildly reduced, moderately increased RV wall thickness, severely elevated pulmonary artery systolic pressure, right atrial size severely dilated.  DVT negative.

## 2022-03-17 NOTE — TOC Progression Note (Signed)
Transition of Care [facility]) - Progression Note    Patient Details  Name: Tammy Barrett MRN: [id number] Date of Birth: Dec 02, 1960  [facility] CM/SW [place], LCSW Phone Number: 03/17/2022, 9:41 AM  Clinical Narrative:  TOC continues to follow for improvement in oxygen requirements prior to sending out SNF referral. Patient is currently on HFNC 7 L.   Expected Discharge Plan: [place] Barriers to Discharge: Continued Medical Work up  Expected Discharge Plan and Services Expected Discharge Plan: [place] Choice: [place] arrangements for the past 2 months: Single Family Home                                       Social Determinants of Health (SDOH) Interventions    Readmission Risk Interventions     No data to display

## 2022-03-17 NOTE — Assessment & Plan Note (Signed)
Secondary to pulmonary emboli Patient at baseline wears 3 L oxygen, currently on 7 L supplementation as needed for emphysema   Continue oxygen supplementation to maintain SpO2 greater than 92%  Try weaning as tolerated  Current continue Lasix 40 mg p.o. twice daily  Pulmonology following

## 2022-03-17 NOTE — Progress Notes (Signed)
Physical Therapy Treatment Patient Details Name: Tammy Barrett MRN: [id number] DOB: Apr 05, 1961 Today's Date: 03/17/2022   History of Present Illness Pt is a 61 y.o. female presenting to hospital 03/06/22 with c/o decreased appetite, some difficulty swallowing, and leg swelling.  Pt admitted with PE (large saddle pulmonary embolism with R heart strain), and acute on chronic respiratory failure with hypoxia.  S/p thrombectomy of saddle PE 03/07/22.  PMH includes htn, lupus, scleroderma, pulmonary fibrosis, pulmonary htn, mixed connective tissue disease, esophageal dysmotility, COPD, HSV, peripheral neuropathy, and Raynaud's disease.  O2 use as needed.    PT Comments    Pt in chair. Stood at chairside with min guard/supervision.  Standing ex x 10 with occasional standing rest breaks and RW support.  At end of ex she begins to become SOB.  Sats 80 on 7lpm HFNC.  Pt slowly increased with deep breathing in sitting back to 88% when family comes in.  Pt opts to visit with granddaughters as it is her [name] today.  Respiratory status remains primary barrier for increasing mobility.   Recommendations for follow up therapy are one component of a multi-disciplinary discharge planning process, led by the attending physician.  Recommendations may be updated based on patient status, additional functional criteria and insurance authorization.  Follow Up Recommendations  Skilled nursing-short term rehab (<3 hours/day)     Assistance Recommended at Discharge Frequent or constant Supervision/Assistance  Patient can return home with the following A little help with walking and/or transfers;A little help with bathing/dressing/bathroom;Assistance with cooking/housework;Assist for transportation;Help with stairs or ramp for entrance   Equipment Recommendations  Rolling walker (2 wheels);BSC/3in1    Recommendations for Other Services       Precautions / Restrictions Precautions Precautions: Fall Precaution  Comments: Mod Fall Risk Restrictions Weight Bearing Restrictions: No     Mobility  Bed Mobility               General bed mobility comments: Deferred (pt in recliner beginning/end of session)    Transfers Overall transfer level: Needs assistance Equipment used: Rolling walker (2 wheels) Transfers: Sit to/from Stand Sit to Stand: Supervision                Ambulation/Gait                   Stairs             Wheelchair Mobility    Modified Rankin (Stroke Patients Only)       Balance Overall balance assessment: Needs assistance Sitting-balance support: No upper extremity supported, Feet supported Sitting balance-Leahy Scale: Good Sitting balance - Comments: no LOB during grooming tasks standing without UE support   Standing balance support: During functional activity, Bilateral upper extremity supported Standing balance-Leahy Scale: Fair Standing balance comment: steady taking steps in place with RW use                            Cognition Arousal/Alertness: Awake/alert Behavior During Therapy: WFL for tasks assessed/performed Overall Cognitive Status: Within Functional Limits for tasks assessed                                          Exercises Other Exercises Other Exercises: standing ex x 10 with walker support    General Comments        Pertinent Vitals/Pain Pain  Assessment Pain Assessment: No/denies pain    Home Living                          Prior Function            PT Goals (current goals can now be found in the care plan section) Progress towards PT goals: Progressing toward goals    Frequency    Min 2X/week      PT Plan Current plan remains appropriate    Co-evaluation              AM-PAC PT "6 Clicks" Mobility   Outcome Measure  Help needed turning from your back to your side while in a flat bed without using bedrails?: None Help needed moving from  lying on your back to sitting on the side of a flat bed without using bedrails?: A Little Help needed moving to and from a bed to a chair (including a wheelchair)?: A Little Help needed standing up from a chair using your arms (e.g., wheelchair or bedside chair)?: A Little Help needed to walk in hospital room?: A Little Help needed climbing 3-5 steps with a railing? : A Lot 6 Click Score: 18    End of Session Equipment Utilized During Treatment: Gait belt;Oxygen Activity Tolerance: Patient tolerated treatment well Patient left: in chair;with call bell/phone within reach;with family/visitor present Nurse Communication: Mobility status;Other (comment) PT Visit Diagnosis: Unsteadiness on feet (R26.81);Muscle weakness (generalized) (M62.81);Other abnormalities of gait and mobility (R26.89)     Time: 7445-1460 PT Time Calculation (min) (ACUTE ONLY): 13 min  Charges:  $Therapeutic Exercise: 8-22 mins                   Chesley Noon, PTA 03/17/22, 4:17 PM

## 2022-03-17 NOTE — Progress Notes (Signed)
Pulmonary Medicine          Date: 03/17/2022,   MRN# [id number] Tammy Barrett [birth year]     A  HISTORY OF PRESENT ILLNESS   This she is gettering better over all, fio2 down to 7 liters. Still has a way to go. Reiterated that rehab is best for her on d/c. She can go when the be is avaoliable with close out patient f/u visits.   PAST MEDICAL HISTORY   Past Medical History:  Diagnosis Date   COPD (chronic obstructive pulmonary disease) ([name])    Discoid lupus    Eczema    Esophageal dysmotility    Genital herpes    GERD (gastroesophageal reflux disease)    HSV (herpes simplex virus) infection    Hypertension    Lumbago    Lumbar disc disease    Peripheral neuropathy    Pulmonary fibrosis ([place])    Pulmonary hypertension ([name])    Raynaud's disease    Sclerodactyly    Scleroderma ([place])    Spinal stenosis      SURGICAL HISTORY   Past Surgical History:  Procedure Laterality Date   bone removal from pinkie toe     COLONOSCOPY WITH PROPOFOL N/A 08/30/2021   Procedure: COLONOSCOPY WITH PROPOFOL;  Surgeon: [name], MD;  Location: [facility] ENDOSCOPY;  Service: Gastroenterology;  Laterality: N/A;   DILATION AND CURETTAGE OF UTERUS     PULMONARY THROMBECTOMY N/A 03/07/2022   Procedure: PULMONARY THROMBECTOMY;  Surgeon: Algernon Huxley, MD;  Location: [place] [facility];  Service: Cardiovascular;  Laterality: N/A;   RIGHT HEART CATH Right 09/17/2018   Procedure: RIGHT HEART CATH;  Surgeon: Teodoro Spray, MD;  Location: [place] [facility];  Service: Cardiovascular;  Laterality: Right;   SPLENECTOMY     TUBAL LIGATION       FAMILY HISTORY   Family History  Problem Relation Age of Onset   Uterine cancer Mother    Stroke Father    Breast cancer Neg Hx      SOCIAL HISTORY   Social History   Tobacco Use   Smoking status: Former    Packs/day: 0.50    Types: Cigarettes    Quit date: 01/08/2011    Years since quitting: 11.1   Smokeless tobacco: Never   Vaping Use   Vaping Use: Never used  Substance Use Topics   Alcohol use: No   Drug use: No     MEDICATIONS    Home Medication:    Current Medication:  Current Facility-Administered Medications:    ambrisentan (LETAIRIS) tablet 5 mg, 5 mg, Oral, Daily, [name], Izaiyah Kleinman E, MD, 5 mg at 03/17/22 0838   amLODipine (NORVASC) tablet 10 mg, 10 mg, Oral, Daily, Dew, Erskine Squibb, MD, 10 mg at 03/17/22 2458   [COMPLETED] apixaban (ELIQUIS) tablet 10 mg, 10 mg, Oral, BID, 10 mg at 03/15/22 2043 **FOLLOWED BY** apixaban (ELIQUIS) tablet 5 mg, 5 mg, Oral, BID, [name], RPH, 5 mg at 03/17/22 0998   Chlorhexidine Gluconate Cloth 2 % PADS 6 each, 6 each, Topical, Q0600, Algernon Huxley, MD, 6 each at 03/17/22 0952   dextromethorphan-guaiFENesin (MUCINEX DM) 30-600 MG per 12 hr tablet 1 tablet, 1 tablet, Oral, BID, Manuella Ghazi, Pratik D, DO, 1 tablet at 03/17/22 0837   feeding supplement (ENSURE ENLIVE / ENSURE PLUS) liquid 237 mL, 237 mL, Oral, TID BM, [name], [name], DO, 237 mL at 03/17/22 0836   fluticasone (FLONASE) 50 MCG/ACT nasal spray 1  spray, 1 spray, Each Nare, Daily PRN, Algernon Huxley, MD, 1 spray at 03/12/22 2153   furosemide (LASIX) tablet 40 mg, 40 mg, Oral, BID, Emeterio Reeve, DO, 40 mg at 03/17/22 9244   gabapentin (NEURONTIN) tablet 600 mg, 600 mg, Oral, TID, Lucky Cowboy, Erskine Squibb, MD, 600 mg at 03/17/22 6286   hydrocortisone cream 1 %, , Topical, QID PRN, Emeterio Reeve, DO   HYDROmorphone (DILAUDID) injection 1 mg, 1 mg, Intravenous, Once PRN, Lucky Cowboy, Erskine Squibb, MD   ipratropium-albuterol (DUONEB) 0.5-2.5 (3) MG/3ML nebulizer solution 3 mL, 3 mL, Nebulization, Q4H PRN, Emeterio Reeve, DO   methylPREDNISolone sodium succinate (SOLU-MEDROL) 125 mg/2 mL injection 80 mg, 80 mg, Intravenous, Q24H, Erby Pian, MD, 80 mg at 03/16/22 2127   multivitamin with minerals tablet 1 tablet, 1 tablet, Oral, Daily, Emeterio Reeve, DO, 1 tablet at 03/17/22 3817   mycophenolate (CELLCEPT)  capsule 500 mg, 500 mg, Oral, BID, Algernon Huxley, MD, 500 mg at 03/17/22 7116   Oral care mouth rinse, 15 mL, Mouth Rinse, PRN, Sharion Settler, NP   pantoprazole (PROTONIX) EC tablet 40 mg, 40 mg, Oral, Daily, Dew, Erskine Squibb, MD, 40 mg at 03/17/22 0836   senna-docusate (Senokot-S) tablet 1 tablet, 1 tablet, Oral, QHS PRN, Algernon Huxley, MD, 1 tablet at 03/09/22 1347   sodium chloride (OCEAN) 0.65 % nasal spray 1 spray, 1 spray, Each Nare, PRN, Max Sane, MD   tadalafil (PAH) (ADCIRCA) tablet 20 mg, 20 mg, Oral, Daily, Dew, Erskine Squibb, MD, 20 mg at 03/17/22 5790    ALLERGIES   Hydroxychloroquine and Meclizine     REVIEW OF SYSTEMS    Review of Systems:  Gen:  Denies  fever, sweats, chills weigh loss  HEENT: Denies blurred vision, double vision, ear pain, eye pain, hearing loss, nose bleeds, sore throat Cardiac:  No dizziness, chest pain or heaviness, chest tightness,edema Resp:   less cough or sputum porduction, less shortness of breath, no wheezing, hemoptysis,  Gi: Denies swallowing difficulty, stomach pain, nausea or vomiting, diarrhea, constipation, bowel incontinence Gu:  Denies bladder incontinence, burning urine Ext:   Denies Joint pain, stiffness, less swelling Skin: Denies  skin rash, easy bruising or bleeding or hives Endoc:  Denies polyuria, polydipsia , polyphagia or weight change Psych:   Denies depression, insomnia or hallucinations   Other:  All other systems negative   VS: BP 102/67 (BP Location: Left Arm)   Pulse (!) 101   Temp 98.7 F (37.1 C) (Oral)   Resp 20   Ht _0  (1.651 m)   Wt 68.4 kg   SpO2 91%   BMI 25.11 kg/m      PHYSICAL EXAM    GENERAL:NAD, no fevers, chills, no weakness no fatigue HEAD: Normocephalic, atraumatic.  EYES: Pupils equal, round, reactive to light. Extraocular muscles intact. No scleral icterus.  MOUTH: Moist mucosal membrane. Dentition intact. No abscess noted.  EAR, NOSE, THROAT: Clear without exudates. No external  lesions.  NECK: Supple. No thyromegaly. No nodules. No JVD.  PULMONARY: Diffuse crackles CARDIOVASCULAR: S1 and S2. Regular rate and rhythm. No murmurs, rubs, or gallops. + edema. Pedal pulses 2+ bilaterally.  GASTROINTESTINAL: Soft, nontender, nondistended. No masses. Positive bowel sounds. No hepatosplenomegaly.  MUSCULOSKELETAL: No swelling, clubbing, or edema. Range of motion full in all extremities.  NEUROLOGIC: Cranial nerves II through XII are intact. No gross focal neurological deficits. Sensation intact. Reflexes intact.  SKIN: No ulceration, lesions, rashes, or cyanosis. Skin warm and dry. Turgor intact.  PSYCHIATRIC: Mood, affect within normal limits. The patient is awake, alert and oriented x 3. Insight, judgment intact.       IMAGING    DG Chest Port 1 View  Result Date: 03/10/2022 CLINICAL DATA:  Leg swelling EXAM: PORTABLE CHEST 1 VIEW COMPARISON:  Chest x-ray dated March 06, 2022; chest CT dated March 07, 2022 FINDINGS: Unchanged cardiomegaly. Severe background emphysema with areas of nodularity, better evaluated on recent chest CT. No new focal consolidation. The visualized skeletal structures are unremarkable. IMPRESSION: Severe emphysema with no new focal consolidation. Electronically Signed   By: Yetta Glassman M.D.   On: 03/10/2022 10:25   PERIPHERAL VASCULAR CATHETERIZATION  Result Date: 03/07/2022 See surgical note for result.  ECHOCARDIOGRAM COMPLETE  Result Date: 03/07/2022    ECHOCARDIOGRAM REPORT   Patient Name:   Tammy Barrett Date of Exam: 03/07/2022 Medical Rec #:  [id number]        Height:       65.0 in Accession #:    [id number]       Weight:       142.4 lb Date of Birth:  [birth year]        BSA:          1.712 m Patient Age:    61 years         BP:           118/100 mmHg Patient Gender: F                HR:           95 bpm. Exam Location:  [facility] Procedure: 2D Echo, Cardiac Doppler and Color Doppler Indications:     Pulmonary Embolus I26.09  History:          Patient has no prior history of Echocardiogram examinations.                  COPD and Pulmonary HTN; Risk Factors:Hypertension.  Sonographer:     Sherrie Sport Referring Phys:  [id number] AMY N COX Diagnosing Phys: Serafina Royals MD IMPRESSIONS  1. Left ventricular ejection fraction, by estimation, is 65 to 70%. The left ventricle has normal function. The left ventricle has no regional wall motion abnormalities. Left ventricular diastolic parameters were normal.  2. Right ventricular systolic function is mildly reduced. The right ventricular size is severely enlarged. Moderately increased right ventricular wall thickness. There is severely elevated pulmonary artery systolic pressure.  3. Right atrial size was severely dilated.  4. The mitral valve is normal in structure. Trivial mitral valve regurgitation.  5. Tricuspid valve regurgitation is severe.  6. The aortic valve is normal in structure. Aortic valve regurgitation is trivial. FINDINGS  Left Ventricle: Left ventricular ejection fraction, by estimation, is 65 to 70%. The left ventricle has normal function. The left ventricle has no regional wall motion abnormalities. The left ventricular internal cavity size was small. There is no left ventricular hypertrophy. Left ventricular diastolic parameters were normal. Right Ventricle: The right ventricular size is severely enlarged. Moderately increased right ventricular wall thickness. Right ventricular systolic function is mildly reduced. There is severely elevated pulmonary artery systolic pressure. Left Atrium: Left atrial size was normal in size. Right Atrium: Right atrial size was severely dilated. Pericardium: There is no evidence of pericardial effusion. Mitral Valve: The mitral valve is normal in structure. Trivial mitral valve regurgitation. Tricuspid Valve: The tricuspid valve is normal in structure. Tricuspid valve regurgitation is severe. Aortic Valve: The aortic valve is normal in  structure. Aortic valve  regurgitation is trivial. Aortic valve mean gradient measures 2.0 mmHg. Aortic valve peak gradient measures 2.8 mmHg. Aortic valve area, by VTI measures 2.37 cm. Pulmonic Valve: The pulmonic valve was normal in structure. Pulmonic valve regurgitation is mild. Aorta: The aortic root and ascending aorta are structurally normal, with no evidence of dilitation. IAS/Shunts: No atrial level shunt detected by color flow Doppler.  LEFT VENTRICLE PLAX 2D LVIDd:         2.60 cm     Diastology LVIDs:         2.00 cm     LV e' medial:    5.87 cm/s LV PW:         1.20 cm     LV E/e' medial:  6.0 LV IVS:        1.20 cm     LV e' lateral:   11.30 cm/s LVOT diam:     2.00 cm     LV E/e' lateral: 3.1 LV SV:         25 LV SV Index:   15 LVOT Area:     3.14 cm  LV Volumes (MOD) LV vol d, MOD A4C: 17.0 ml LV vol s, MOD A4C: 10.5 ml LV SV MOD A4C:     17.0 ml RIGHT VENTRICLE RV Basal diam:  4.30 cm RV S prime:     7.72 cm/s TAPSE (M-mode): 1.6 cm LEFT ATRIUM             Index        RIGHT ATRIUM           Index LA diam:        3.40 cm 1.99 cm/m   RA Area:     42.90 cm LA Vol (A2C):   67.6 ml 39.48 ml/m  RA Volume:   218.00 ml 127.31 ml/m LA Vol (A4C):   17.7 ml 10.34 ml/m LA Biplane Vol: 35.7 ml 20.85 ml/m  AORTIC VALVE AV Area (Vmax):    2.32 cm AV Area (Vmean):   2.02 cm AV Area (VTI):     2.37 cm AV Vmax:           83.20 cm/s AV Vmean:          59.100 cm/s AV VTI:            0.106 m AV Peak Grad:      2.8 mmHg AV Mean Grad:      2.0 mmHg LVOT Vmax:         61.40 cm/s LVOT Vmean:        38.000 cm/s LVOT VTI:          0.080 m LVOT/AV VTI ratio: 0.75  AORTA Ao Root diam: 3.15 cm MITRAL VALVE               TRICUSPID VALVE MV Area (PHT): 8.92 cm    TR Peak grad:   74.0 mmHg MV Decel Time: 85 msec     TR Vmax:        430.00 cm/s MV E velocity: 35.10 cm/s MV A velocity: 74.60 cm/s  SHUNTS MV E/A ratio:  0.47        Systemic VTI:  0.08 m                            Systemic Diam: 2.00 cm Serafina Royals MD Electronically signed by  Serafina Royals MD Signature Date/Time: 03/07/2022/12:58:25 PM  Final    CT CHEST W CONTRAST  Result Date: 03/07/2022 CLINICAL DATA:  Soft tissue mass. EXAM: CT CHEST WITH CONTRAST TECHNIQUE: Multidetector CT imaging of the chest was performed during intravenous contrast administration. RADIATION DOSE REDUCTION: This exam was performed according to the departmental dose-optimization program which includes automated exposure control, adjustment of the mA and/or kV according to patient size and/or use of iterative reconstruction technique. CONTRAST:  20m OMNIPAQUE IOHEXOL 300 MG/ML  SOLN COMPARISON:  CTA of the chest of March 06, 2022. FINDINGS: Cardiovascular: As noted on the exam performed yesterday, there is again noted large saddle embolus extending from proximal right pulmonary artery and through left pulmonary artery into the left lower lobe branches. This is not significantly changed compared to yesterday. Atherosclerosis of thoracic aorta is noted without aneurysm or dissection. No pericardial effusion is noted. Mild cardiomegaly is noted. Mediastinum/Nodes: Thyroid gland is unremarkable. The esophagus appears normal. 2.3 cm right hilar lymph node is noted. Lungs/Pleura: No pneumothorax or pleural effusion is noted. Emphysematous disease and scarring is noted throughout both lungs. Stable 5 mm nodule is noted in right lower lobe best seen on image number 73 of series 3. Stable 3 mm nodule is noted in left upper lobe best seen on image number 39 of series 3. Stable 3 mm nodules also seen in left upper lobe best seen on image number 49 of series 3. Upper Abdomen: No acute abnormality. Musculoskeletal: No chest wall abnormality. No acute or significant osseous findings. IMPRESSION: There is stable large pulmonary embolus extending primarily through the left pulmonary artery and into the left lower lobe branches as described on prior exam. 2.3 cm right hilar lymph node is noted which may be inflammatory in  etiology, but neoplasm cannot be excluded. Follow-up CT scan with intravenous contrast is recommended in 2-3 months to evaluate stability. Extensive emphysematous disease and scarring is noted throughout both lungs as described on prior exam. Multiple pulmonary nodules are again noted as described on prior exam performed yesterday, with the largest measuring 5 mm in the right lower lobe. No follow-up needed if patient is low-risk (and has no known or suspected primary neoplasm). Non-contrast chest CT can be considered in 12 months if patient is high-risk. This recommendation follows the consensus statement: Guidelines for Management of Incidental Pulmonary Nodules Detected on CT Images: From the Fleischner Society 2017; Radiology 2017; 284:228-243. Aortic Atherosclerosis (ICD10-I70.0) and Emphysema (ICD10-J43.9). Electronically Signed   By: JMarijo ConceptionM.D.   On: 03/07/2022 11:14   [place]Venous Img Lower Bilateral (DVT)  Result Date: 03/07/2022 CLINICAL DATA:  Patient with pulmonary embolism.  Evaluate for DVT. EXAM: BILATERAL LOWER EXTREMITY VENOUS DOPPLER ULTRASOUND TECHNIQUE: Gray-scale sonography with graded compression, as well as color Doppler and duplex ultrasound were performed to evaluate the lower extremity deep venous systems from the level of the common femoral vein and including the common femoral, femoral, profunda femoral, popliteal and calf veins including the posterior tibial, peroneal and gastrocnemius veins when visible. The superficial great saphenous vein was also interrogated. Spectral Doppler was utilized to evaluate flow at rest and with distal augmentation maneuvers in the common femoral, femoral and popliteal veins. COMPARISON:  None Available. FINDINGS: RIGHT LOWER EXTREMITY Common Femoral Vein: No evidence of thrombus. Normal compressibility, respiratory phasicity and response to augmentation. Saphenofemoral Junction: No evidence of thrombus. Normal compressibility and flow on color  Doppler imaging. Profunda Femoral Vein: No evidence of thrombus. Normal compressibility and flow on color Doppler imaging. Femoral Vein: No evidence  of thrombus. Normal compressibility, respiratory phasicity and response to augmentation. Popliteal Vein: No evidence of thrombus. Normal compressibility, respiratory phasicity and response to augmentation. Calf Veins: No evidence of thrombus. Normal compressibility and flow on color Doppler imaging. Other Findings:  None. LEFT LOWER EXTREMITY Common Femoral Vein: No evidence of thrombus. Normal compressibility, respiratory phasicity and response to augmentation. Saphenofemoral Junction: No evidence of thrombus. Normal compressibility and flow on color Doppler imaging. Profunda Femoral Vein: No evidence of thrombus. Normal compressibility and flow on color Doppler imaging. Femoral Vein: No evidence of thrombus. Normal compressibility, respiratory phasicity and response to augmentation. Popliteal Vein: No evidence of thrombus. Normal compressibility, respiratory phasicity and response to augmentation. Calf Veins: No evidence of thrombus. Normal compressibility and flow on color Doppler imaging. Other Findings:  None. IMPRESSION: No evidence of deep venous thrombosis in either lower extremity. Electronically Signed   By: Markus Daft M.D.   On: 03/07/2022 07:29   CT Angio Chest PE W/Cm &/Or Wo Cm  Result Date: 03/06/2022 CLINICAL DATA:  CT Abdomen Pelvis W Contrast suspected, high prob Abdominal pain, acute, nonlocalized EXAM: CT ANGIOGRAPHY CHEST WITH CONTRAST TECHNIQUE: Multidetector CT imaging of the chest was performed using the standard protocol during bolus administration of intravenous contrast. Multiplanar CT image reconstructions and MIPs were obtained to evaluate the vascular anatomy. RADIATION DOSE REDUCTION: This exam was performed according to the departmental dose-optimization program which includes automated exposure control, adjustment of the mA and/or kV  according to patient size and/or use of iterative reconstruction technique. CONTRAST:  47m OMNIPAQUE IOHEXOL 350 MG/ML SOLN COMPARISON:  CT chest 03/05/2020 FINDINGS: Cardiovascular: Satisfactory opacification of the pulmonary arteries to the segmental level. Nonocclusive saddle embolus extending to the left lower lobe segmental and subsegmental levels as well as into the proximal right main pulmonary artery. Enlarged right heart with marked enlargement of the right atria. There is increased right to left ventricular ratio of at least 1.5. Retrograde reflux of intravenous contrast into a dilated inferior vena cava and hepatic veins. The main pulmonary artery is enlarged measuring up to 3.7 cm. No significant pericardial effusion. The thoracic aorta is normal in caliber. No atherosclerotic plaque of the thoracic aorta. No coronary artery calcifications. Mediastinum/Nodes: No enlarged mediastinal, hilar, or axillary lymph nodes. Thyroid gland, trachea, and esophagus demonstrate no significant findings. Lungs/Pleura: Severe centrilobular and paraseptal emphysematous changes. Scattered areas of reticulations, scarring, and bronchiectasis. No focal consolidation. 0.8 x 0.4 cm right middle lobe pulmonary nodule. Adjacent 0.7 x 0.6 cm ground-glass subpleural nodule (6:49). 0.5 x 0.5 cm left upper lobe pulmonary nodule (6:33). Left upper lobe pulmonary micronodule (6:27). Several other scattered pulmonary micronodules. No pulmonary mass. No pleural effusion. No pneumothorax. Upper Abdomen: Please see separately dictated CT abdomen pelvis 03/06/2022 Musculoskeletal: Subcutaneus soft tissue edema. No suspicious lytic or blastic osseous lesions. No acute displaced fracture. Multilevel degenerative changes of the spine. Chronic appearing minimal vertebral body height loss of the L4-L5 level. Review of the MIP images confirms the above findings. IMPRESSION: 1. Nonocclusive saddle pulmonary embolus extending to the left lower  lobe segmental and subsegmental levels as well as into the proximal right main pulmonary artery. Associated right heart strain. No pulmonary infarction. 2.  Emphysema (ICD10-J43.9) - severe. 3. Scattered pulmonary micronodules as well as a 0.8 x 0.4 cm right middle lobe pulmonary nodule and adjacent 0.7 x 0.6 cm ground-glass subpleural nodule. Initial follow-up with CT at 6 months is recommended to confirm persistence. If persistent, repeat CT is recommended every 2 years  until 5 years of stability has been established. This recommendation follows the consensus statement: Guidelines for Management of Incidental Pulmonary Nodules Detected on CT Images: From the Fleischner Society 2017; Radiology 2017; 284:228-243. These results were called by telephone at the time of interpretation on 03/06/2022 at 3:21 pm to provider [facility] , who verbally acknowledged these results. Electronically Signed   By: Iven Finn M.D.   On: 03/06/2022 15:32   CT Abdomen Pelvis W Contrast  Result Date: 03/06/2022 CLINICAL DATA:  Abdominal pain EXAM: CT ABDOMEN AND PELVIS WITH CONTRAST TECHNIQUE: Multidetector CT imaging of the abdomen and pelvis was performed using the standard protocol following bolus administration of intravenous contrast. RADIATION DOSE REDUCTION: This exam was performed according to the departmental dose-optimization program which includes automated exposure control, adjustment of the mA and/or kV according to patient size and/or use of iterative reconstruction technique. CONTRAST:  58m OMNIPAQUE IOHEXOL 350 MG/ML SOLN COMPARISON:  None Available. FINDINGS: Lower chest: Centrilobular and panlobular emphysema is seen in the lower lung fields. Prominence of interstitial markings in the lower lung fields suggest scarring. Heart is enlarged in size. Hepatobiliary: Liver measures 18.3 cm in length. There is fatty infiltration. There is no dilation of bile ducts. Small amount of fluid adjacent to the  gallbladder may be part of ascites. There is no significant wall thickening in gallbladder. Gallbladder is not distended. Pancreas: No focal abnormalities are seen. Spleen: Spleen is not seen. Surgical clips on the left hemidiaphragms suggest possible previous splenectomy. Adrenals/Urinary Tract: There is mild hyperplasia of left adrenal. There is no hydronephrosis. There are no renal or ureteral stones. Urinary bladder is not distended. Stomach/Bowel: Stomach is not distended. Small bowel loops are not dilated. Appendix is not seen. There is no significant wall thickening in colon. Vascular/Lymphatic: Unremarkable. Reproductive: Unremarkable. Other: Small ascites is present. There is no pneumoperitoneum. Left inguinal hernia containing fat is seen. There is subcutaneous edema suggesting anasarca. Musculoskeletal: Slight decrease in height of upper end plate of body of L4 vertebra may be residual from previous injury. Degenerative changes are noted in the lumbar spine, more so at L4-L5 and L5-S1 levels. There is minimal retrolisthesis at L4-L5 level. IMPRESSION: There is no evidence of intestinal obstruction or pneumoperitoneum. There is no hydronephrosis. Enlarged fatty liver. Small ascites. Severe COPD. Lumbar spondylosis. Electronically Signed   By: PElmer PickerM.D.   On: 03/06/2022 15:23   DG Chest 2 View  Result Date: 03/06/2022 CLINICAL DATA:  Shortness of breath. EXAM: CHEST - 2 VIEW COMPARISON:  CT scan of March 05, 2020. FINDINGS: Mild cardiomegaly is noted. Emphysematous disease is noted throughout both lungs as well as coarse reticular opacities concerning for scarring or fibrosis. No definite acute abnormality is noted. Bony thorax is unremarkable. IMPRESSION: Emphysematous changes noted bilaterally as well as coarse reticular opacities bilaterally concerning for fibrosis or scarring. No definite acute abnormality is noted. Aortic Atherosclerosis (ICD10-I70.0) and Emphysema (ICD10-J43.9).  Electronically Signed   By: JMarijo ConceptionM.D.   On: 03/06/2022 14:05      ASSESSMENT/PLAN   Acute on chronic hypoxia; multifactorial, came in with bilateral pulmonary emboli, in a lady who was not very active and significant underlying medical problems including; CTD, pulmonary fibrosis, pulmonary hypertension, severe copd with bullae and ground glass infiltrates. My concern is there also an IPF flare.   Bilateral pulmonary emboli. S/p mechanical thromboembolectomy. She is on anticoagulation- Eliquis. Base on her overall condition, limited ambulation, high risk of repeat clots, we discussed  most likely will keep her on it indefinitely. 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 However, the increase ground glass infiltrate. ? Underlying IPF flare. Agree with resuming  mycophenolate 500 mg p.o. twice daily and solumedrol 50 mg iv q 12 hrs, lasix as you doing. 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 Will discuss with rheum ? Seeking authorization for out patient esbriet. Alos will send to [facility] for possible lung transplant.  Pulmonary hypertension. 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 Hence on adcirca. 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 Will add letaris and if need be tyvaso Copd, with bullae dz. Stable No acute bronchospasm. Duo neg q 6 hr while awake. Incentive spirometry Scattered micro nodules bilateral, 0.8 RML nodule, 0.7 cm LUL ground class nodule, will continue surveillance on the out side,anca, ace pending.   Discuss d/c plans with attending      Thank you for allowing me to participate in the care of this patient.   Patient/Family are satisfied with care plan and all questions have been answered.  This document was prepared using Dragon voice recognition software and may include unintentional dictation errors.     Wallene Huh, M.D.   Division of [place]

## 2022-03-18 DIAGNOSIS — I2602 Saddle embolus of pulmonary artery with acute cor pulmonale: Secondary | ICD-10-CM | POA: Diagnosis not present

## 2022-03-18 DIAGNOSIS — Z789 Other specified health status: Secondary | ICD-10-CM

## 2022-03-18 DIAGNOSIS — K219 Gastro-esophageal reflux disease without esophagitis: Secondary | ICD-10-CM | POA: Diagnosis not present

## 2022-03-18 DIAGNOSIS — I5032 Chronic diastolic (congestive) heart failure: Secondary | ICD-10-CM | POA: Diagnosis not present

## 2022-03-18 DIAGNOSIS — E43 Unspecified severe protein-calorie malnutrition: Secondary | ICD-10-CM | POA: Diagnosis not present

## 2022-03-18 LAB — HEPATIC FUNCTION PANEL
ALT: 38 U/L (ref 0–44)
AST: 52 U/L — ABNORMAL HIGH (ref 15–41)
Albumin: 2.4 g/dL — ABNORMAL LOW (ref 3.5–5.0)
Alkaline Phosphatase: 200 U/L — ABNORMAL HIGH (ref 38–126)
Bilirubin, Direct: 0.2 mg/dL (ref 0.0–0.2)
Indirect Bilirubin: 0.4 mg/dL (ref 0.3–0.9)
Total Bilirubin: 0.6 mg/dL (ref 0.3–1.2)
Total Protein: 6.2 g/dL — ABNORMAL LOW (ref 6.5–8.1)

## 2022-03-18 LAB — CBC
HCT: 40.1 % (ref 36.0–46.0)
Hemoglobin: 13.3 g/dL (ref 12.0–15.0)
MCH: 31.5 pg (ref 26.0–34.0)
MCHC: 33.2 g/dL (ref 30.0–36.0)
MCV: 95 fL (ref 80.0–100.0)
Platelets: 297 10*3/uL (ref 150–400)
RBC: 4.22 MIL/uL (ref 3.87–5.11)
RDW: 17.9 % — ABNORMAL HIGH (ref 11.5–15.5)
WBC: 6.2 10*3/uL (ref 4.0–10.5)
nRBC: 0 % (ref 0.0–0.2)

## 2022-03-18 MED ORDER — STERILE WATER FOR INJECTION IJ SOLN
INTRAMUSCULAR | Status: AC
  Filled 2022-03-18: qty 10

## 2022-03-18 NOTE — Progress Notes (Signed)
Progress Note   Patient: Tammy Barrett CZY:[id number] DOB: [birth year] DOA: 03/06/2022     12 DOS: the patient was seen and examined on 03/18/2022   Brief hospital course: [name] is a 61 year old female with hypertension, neuropathy, GERD, pulmonary hypertension, scleroderma, COPD, hepatosteatosis, mixed connective tissue disease, long-term use of immunosuppressant medication, pulmonary fibrosis, who presents to the emergency department 03/06/2022 for chief concerns of poor appetite. Marland Barrett 08/10: Initial vitals heart rate of 103, blood pressure 123/103, SpO2 of 72% on room air with improvement to 95% on 5 L nasal cannula. BNP was elevated at 1738.4, high sensitive troponin was 40, creatinine 1.24.  No concerns on CT abdomen/pelvis.  CTA chest (+)Nonocclusive saddle pulmonary embolus extending to the left lower lobe segmental and subsegmental levels as well as into the proximal right main pulmonary artery. Associated right heart strain. No pulmonary infarction.  Severe emphysema, multiple pulmonary nodules to follow-up outpatient. Heparin GTT per pharmacy was initiated.  Patient was admitted to hospitalist service stepdown status with vascular consult for saddle PE. EDP had already consulted vascular who recommended heparin bolus and patient to be taken to the OR for thrombectomy in the a.m.  . 08/11: thrombectomy planned. I was contacted by radiologist Dr. Denna Haggard this morning and due to abnormal appearance of presumed clot and no evidence for DVT as well as significantly dilated RA, radiology is recommending CT chest with contrast to evaluate for potential mediastinal mass as opposed to pulmonary embolus, prior to taking her for thrombectomy.  Confirmed PE.  Also noted 2.3 cm right hilar lymph node, possible inflammatory, recommending follow-up CT with IV contrast 2 to 3 months. Echocardiogram --> LVEF 65 to 70%, normal function, normal diastolic parameters.  Right ventricular systolic function  mildly reduced, moderately increased RV wall thickness, severely elevated pulmonary artery systolic pressure, right atrial size severely dilated.  Underwent thrombolysis/thrombectomy.  Marland Barrett 08/12: Remains on heparin drip.  HFNC - requiring 10 L/min this morning but improved to 5 L/min later in the day. Pt reports SOB is overall better. Transferred to progressive status.  Marland Barrett 08/13: still requiring 6 /min O2. D/c heparin gtt 48 hours after thrombectomy and transition Eliquis. RN voiced concern low UOP, continue strict I&O and encouraged po intake  . 08/14: requiring 8 L/min overnight and maintaining on 5 L/min O2 this morning, no output documented from yesterday. Rales/coarse breath sounds and JVD --> Lasix 60 mg IV x1 and restart home lasix. CXR obtained to futher eval possible underlying pneumonia - images reviewed, stable cardiomegaly and severe background emphysema no new consolidations.  Marland Barrett 08/15: paged pulmonary Dr [name] 10:53 AM for routine consult. Pt appears fluid overloaded based on LE edema, rales, JVD but given pulm HTN and underlying lung disease will also seek pulmonary recs if any other interventions might be helpful. Continue lasix for now and strict I&O, daily weights.  Marland Barrett 08/16: still needing 9L/min, confirmed pulmonary to see her today.  . 8/17: On 8 L nasal cannula oxygen.  Pulmonary following . 8/18: on high flow with FiO2 of 35% and tolerating well with sat in mid-high 90's . 8/19: Continues on high flow with FiO2 45%.  Continues to have cough with whitish sputum production. . 8/20: Able to wean back to 8 L of oxygen.  Uses 3 L at baseline.  PT/OT are recommending SNF . 8/21: Clinically seems stable, at 7 L of oxygen. . 8/22: Remains stable and able to wean to 5 L.   Assessment and Plan: * Pulmonary  emboli [facility])  S/p mechanical thrombectomy by vascular surgery.  Initially treated with heparin now transition to Eliquis.  Echocardiogram --> LVEF 65 to 70%, normal function, normal  diastolic parameters.  Right ventricular systolic function mildly reduced, moderately increased RV wall thickness, severely elevated pulmonary artery systolic pressure, right atrial size severely dilated.  DVT negative.  Acute on chronic respiratory failure with hypoxia ([name]) Secondary to pulmonary emboli Patient at baseline wears 3 L oxygen, currently on 5 L supplementation as needed for emphysema   Continue oxygen supplementation to maintain SpO2 greater than 92%  Try weaning as tolerated  Current continue Lasix 40 mg p.o. twice daily  Pulmonology following    Pulmonary fibrosis ([place]) Pulmonary hypertension  Pulmonology following  Continue home tadalafil 20 mg daily.  Pulmonary hypertension ([name]) Seems stable.  Pulmonology following, he has started patient on Ambrisentan and tadalafil   Continue Lasix 40 mg po bid, strict I&O  Mixed connective tissue disease ([name])  status post Plaquenil, prednisone, Imuran  Currently on mycophenolate 500 mg p.o. twice daily.  Solu-Medrol 80 mg IV daily  Lymphadenopathy, mediastinal 2.3 cm right hilar lymph node, possible inflammatory, noted on CT chest 03/07/22   Outpatient follow-up CT with IV contrast 2 to 3 months  (HFpEF) heart failure with preserved ejection fraction ([employer] [employer]) 03/16/22 evidenced by rales, LE edema, JVD 08/11 Echocardiogram --> LVEF 65 to 70%, normal function, normal diastolic parameters.  Right ventricular systolic function mildly reduced, moderately increased RV wall thickness, severely elevated pulmonary artery systolic pressure, right atrial size severely dilated.  Continue Lasix 40 mg p.o. twice daily  CXR no significant pulmonary edema  Pulmonology following    Protein-calorie malnutrition, severe Complicates overall prognosis. -Dietitian consult  Emphysema, spiromety in office is c/w mild obstruction ([place]) Extensive lung disease, see above for pulmonary fibrosis On IV Solu-Medrol and nebulizer  along with inhalers No home inhalers Not compliant w/ home O2  Pulmonology following  DuoNeb prn         Subjective: Patient was seen and examined today.  No new complaints.  A friend at bedside.  Physical Exam: Vitals:   03/18/22 0838 03/18/22 0904 03/18/22 1244 03/18/22 1300  BP: 118/80  103/75   Pulse: 88  (!) 102   Resp: 20  20   Temp: 98.6 F (37 C)  98.9 F (37.2 C)   TempSrc: Oral  Oral   SpO2: 92% 93% 90% 91%  Weight:      Height:       General.  Well-developed lady, in no acute distress. Pulmonary.  Lungs clear bilaterally, normal respiratory effort. CV.  Regular rate and rhythm, no JVD, rub or murmur. Abdomen.  Soft, nontender, nondistended, BS positive. CNS.  Alert and oriented .  No focal neurologic deficit. Extremities.  Trace LE edema, no cyanosis, pulses intact and symmetrical. Psychiatry.  Judgment and insight appears normal.  Data Reviewed: Prior data reviewed  Family Communication: Discussed with patient and friend at bedside  Disposition: Status is: Inpatient Remains inpatient appropriate because: Need placement   Planned Discharge Destination: Skilled nursing facility  DVT prophylaxis.  Eliquis Time spent: 40 minutes  This record has been created using Systems analyst. Errors have been sought and corrected,but may not always be located. Such creation errors do not reflect on the standard of care.  Author: Lorella Nimrod, MD 03/18/2022 4:21 PM  For on call review [email or web address].

## 2022-03-18 NOTE — Progress Notes (Signed)
Mobility Specialist - Progress Note   Pre-mobility: SpO2 90% During mobility: SpO2 88% Post-mobility: SPO2 90%    03/18/22 1407  Mobility  Activity Ambulated with assistance in room  Level of Assistance Standby assist, set-up cues, supervision of patient - no hands on  Assistive Device None  Distance Ambulated (ft) 20 ft  Activity Response Tolerated well  $Mobility charge 1 Mobility   Pt sitting in chair upon entry, utilizing 6L O2. Pt stood up with SBA. Pt ambulated to door in room and back to chair. Pt tolerated standing with SBA, on 6L O2 and desats within 2-3 min to 88%, requiring rest break to recover back up to 90%. Pt left sitting in chair with needs in reach, with O2 sitting at 90%.  Candie [place] Mobility Specialist 03/18/22 2:13 PM

## 2022-03-18 NOTE — Plan of Care (Signed)
  Problem: Education: Goal: Knowledge of General Education information will improve Description: Including pain rating scale, medication(s)/side effects and non-pharmacologic comfort measures Outcome: Progressing   Problem: Health Behavior/Discharge Planning: Goal: Ability to manage health-related needs will improve Outcome: Progressing

## 2022-03-18 NOTE — Assessment & Plan Note (Signed)
Secondary to pulmonary emboli Patient at baseline wears 3 L oxygen, currently on 5 L supplementation as needed for emphysema   Continue oxygen supplementation to maintain SpO2 greater than 92%  Try weaning as tolerated  Current continue Lasix 40 mg p.o. twice daily  Pulmonology following

## 2022-03-18 NOTE — Assessment & Plan Note (Signed)
S/p mechanical thrombectomy by vascular surgery.  Initially treated with heparin now transition to Eliquis.  Echocardiogram --> LVEF 65 to 70%, normal function, normal diastolic parameters.  Right ventricular systolic function mildly reduced, moderately increased RV wall thickness, severely elevated pulmonary artery systolic pressure, right atrial size severely dilated.  DVT negative.

## 2022-03-18 NOTE — NC FL2 (Signed)
[place] LEVEL OF CARE SCREENING TOOL     IDENTIFICATION  Patient Name: Tammy Barrett Birthdate: 1961-07-27 Sex: female Admission Date (Current Location): 03/06/2022  [place] and [place] Number:  Engineering geologist and Address:  [facility], [address] [place], [place] [address]      Provider Number: [id number]  Attending Physician Name and Address:  Lorella Nimrod, MD  Relative Name and Phone Number:       Current Level of Care: Hospital Recommended Level of Care: [place] Prior Approval Number:    Date Approved/Denied:   PASRR Number: [id number]  Discharge Plan: SNF    Current Diagnoses: Patient Active Problem List   Diagnosis Date Noted   Protein-calorie malnutrition, severe 03/11/2022   (HFpEF) heart failure with preserved ejection fraction ([place]) 03/10/2022   Pressure injury of skin 03/09/2022   Emphysema, spiromety in office is c/w mild obstruction ([place]) 03/07/2022   Lymphadenopathy, mediastinal 03/07/2022   Pulmonary emboli ([place]) 03/06/2022   GERD (gastroesophageal reflux disease) 03/06/2022   Acute on chronic respiratory failure with hypoxia ([place]) 03/06/2022   Encounter for general adult medical examination without abnormal findings 11/02/2018   Pulmonary fibrosis ([place]) 12/14/2013   Pulmonary hypertension ([place]) 12/14/2013   Mixed connective tissue disease ([place]) 12/13/2013   Lumbar disc disease 12/13/2013    Orientation RESPIRATION BLADDER Height & Weight     Self, Time, Situation, Place  O2 (Currently on Nasal Cannula 7 L. Weaning as tolerated.) Continent Weight: 150 lb 14.4 oz (68.4 kg) Height:  _0  (165.1 cm)  BEHAVIORAL SYMPTOMS/MOOD NEUROLOGICAL BOWEL NUTRITION STATUS   (None)  (None) Continent Diet (Regular)  AMBULATORY STATUS COMMUNICATION OF NEEDS Skin   Limited Assist Verbally Skin abrasions, PU Stage and Appropriate Care   PU Stage 2 Dressing:  (Right proximal anterior groin:  Foam prn.)                   Personal Care Assistance Level of Assistance  Bathing, Feeding, Dressing Bathing Assistance: Limited assistance Feeding assistance: Limited assistance Dressing Assistance: Limited assistance     Functional Limitations Info  Sight, Hearing, Speech Sight Info: Adequate Hearing Info: Adequate Speech Info: Adequate    SPECIAL CARE FACTORS FREQUENCY  PT (By licensed PT), OT (By licensed OT)     PT Frequency: 5 x week OT Frequency: 5 x week            Contractures Contractures Info: Not present    Additional Factors Info  Code Status, Allergies Code Status Info: Full code Allergies Info: Hydroxychloroquine, Meclizine           Current Medications (03/18/2022):  This is the current hospital active medication list Current Facility-Administered Medications  Medication Dose Route Frequency Provider Last Rate Last Admin   ambrisentan (LETAIRIS) tablet 5 mg  5 mg Oral Daily Wallene Huh E, MD   5 mg at 03/18/22 0926   amLODipine (NORVASC) tablet 10 mg  10 mg Oral Daily Algernon Huxley, MD   10 mg at 03/18/22 6962   apixaban (ELIQUIS) tablet 5 mg  5 mg Oral BID [name], RPH   5 mg at 03/18/22 9528   Chlorhexidine Gluconate Cloth 2 % PADS 6 each  6 each Topical Q0600 Algernon Huxley, MD   6 each at 03/18/22 4132   dextromethorphan-guaiFENesin (MUCINEX DM) 30-600 MG per 12 hr tablet 1 tablet  1 tablet Oral BID Manuella Ghazi, Pratik D, DO   1 tablet  at 03/18/22 0926   feeding supplement (ENSURE ENLIVE / ENSURE PLUS) liquid 237 mL  237 mL Oral TID BM Emeterio Reeve, DO   237 mL at 03/18/22 0927   fluticasone (FLONASE) 50 MCG/ACT nasal spray 1 spray  1 spray Each Nare Daily PRN Algernon Huxley, MD   1 spray at 03/12/22 2153   furosemide (LASIX) tablet 40 mg  40 mg Oral BID Emeterio Reeve, DO   40 mg at 03/18/22 8115   gabapentin (NEURONTIN) tablet 600 mg  600 mg Oral TID Algernon Huxley, MD   600 mg at 03/18/22 7262   hydrocortisone cream 1 %   Topical  QID PRN Emeterio Reeve, DO       HYDROmorphone (DILAUDID) injection 1 mg  1 mg Intravenous Once PRN Algernon Huxley, MD       ipratropium-albuterol (DUONEB) 0.5-2.5 (3) MG/3ML nebulizer solution 3 mL  3 mL Nebulization Q4H PRN Emeterio Reeve, DO       methylPREDNISolone sodium succinate (SOLU-MEDROL) 125 mg/2 mL injection 80 mg  80 mg Intravenous Q24H Wallene Huh E, MD   80 mg at 03/17/22 2100   multivitamin with minerals tablet 1 tablet  1 tablet Oral Daily Emeterio Reeve, DO   1 tablet at 03/18/22 0355   mycophenolate (CELLCEPT) capsule 500 mg  500 mg Oral BID Algernon Huxley, MD   500 mg at 03/18/22 9741   Oral care mouth rinse  15 mL Mouth Rinse PRN Sharion Settler, NP       pantoprazole (PROTONIX) EC tablet 40 mg  40 mg Oral Daily Algernon Huxley, MD   40 mg at 03/18/22 6384   senna-docusate (Senokot-S) tablet 1 tablet  1 tablet Oral QHS PRN Algernon Huxley, MD   1 tablet at 03/09/22 1347   sodium chloride (OCEAN) 0.65 % nasal spray 1 spray  1 spray Each Nare PRN Max Sane, MD       tadalafil (PAH) (ADCIRCA) tablet 20 mg  20 mg Oral Daily Algernon Huxley, MD   20 mg at 03/18/22 5364     Discharge Medications: Please see discharge summary for a list of discharge medications.  Relevant Imaging Results:  Relevant Lab Results:   Additional Information SS#: [id number]  Candie Chroman, LCSW

## 2022-03-18 NOTE — Progress Notes (Signed)
Palliative Care Progress Note, Assessment & Plan   Patient Name: Tammy Barrett       Date: 03/18/2022 DOB: 01-16-1961  Age: 61 y.o. MRN#: [id number] Attending Physician: Lorella Nimrod, MD Primary Care Physician: Dion Body, MD Admit Date: 03/06/2022  Reason for Consultation/Follow-up: Establishing goals of care  Subjective: Patient is sitting in recliner and preparing to work with PT to brush her teeth and take a few steps.  Patient's friend Vaughan Basta is at bedside.  Patient has no acute complaints.  She acknowledges my presence and is able to make her wishes known.  HPI: 61 y.o. female  with past medical history of HTN, pulmonary HTN, COPD, scleroderma, Raynaud's disease, pulmonary fibrosis, and neuropathy admitted on 03/06/2022 with shortness of breath with exertion.    Patient is being treated for large saddle pulmonary embolism with right heart strain.  8/11, patient underwent mechanical thromboembolectomy.  Pulmonary and palliative medicine has been consulted.   PMT was consulted to discuss goals of care.  Summary of counseling/coordination of care: After reviewing the patient's chart and assessing the patient at bedside, I spoke with patient's in regards to goals of care.  I reminded patient I intriduced copies advance directives and MOST form and discussed surrogate decision makers with her last week.  I asked if she had given any more thought to these topics. I attempted to elicit goals and values important to the patient.  Patient shares she just wants to think about where she will be going.  She did not wish to further discuss advance care planning documentation at this time.  She shares she has a list of SNFs but does not know which SNF to pick.  She says she does not know how soon she needs  to pick a facility.  I shared that [facility] social worker and case management working with her for disposition and that we cannot sway or influences her decision on which SNF to choose.   Patient resistant to palliative discussions at this time.  Advanced directive and MOST form at are bedside. Patient has PMT contact info.   PMT will continue to shadow the chart and reengage at patient/family's request, if goals change, or if patient's health status declines.  Full code remains.  Plan is set for patient to discharge to SNF.  Code Status: Full code  Prognosis: Unable to determine  Discharge Planning: SNF  Physical Exam Constitutional:      Appearance: Normal appearance.  HENT:     Mouth/Throat:     Mouth: Mucous membranes are moist.  Eyes:     Pupils: Pupils are equal, round, and reactive to light.  Cardiovascular:     Pulses: Normal pulses.  Pulmonary:     Effort: Pulmonary effort is normal.     Comments: [place] in place Abdominal:     Palpations: Abdomen is soft.  Musculoskeletal:        General: Normal range of motion.  Skin:    General: Skin is warm and dry.  Neurological:     Mental Status: She is alert and oriented to person, place, and time.  Psychiatric:        Mood and Affect: Mood normal.  Behavior: Behavior normal.        Thought Content: Thought content normal.        Judgment: Judgment normal.             Palliative Assessment/Data: 60%    Total Time 25 minutes  Greater than 50%  of this time was spent counseling and coordinating care related to the above assessment and plan.  Thank you for allowing the Palliative Medicine Team to assist in the care of this patient.  [place] Ilsa Iha, FNP-BC Palliative Medicine Team Team Phone # [phone]

## 2022-03-18 NOTE — Progress Notes (Signed)
Pulmonary Medicine          Date: 03/18/2022,   MRN# [id number] Tammy Barrett 1961-04-02       HISTORY OF PRESENT ILLNESS   Uneventful night. No new complaints. Fio2 down to 6 liters. D/c p[lans is to send ro rehab hopefully this week. Answered her questions. And discussed the pans for the pulm htn/fibrosis.   PAST MEDICAL HISTORY   Past Medical History:  Diagnosis Date   COPD (chronic obstructive pulmonary disease) ([name])    Discoid lupus    Eczema    Esophageal dysmotility    Genital herpes    GERD (gastroesophageal reflux disease)    HSV (herpes simplex virus) infection    Hypertension    Lumbago    Lumbar disc disease    Peripheral neuropathy    Pulmonary fibrosis ([place])    Pulmonary hypertension ([name])    Raynaud's disease    Sclerodactyly    Scleroderma ([place])    Spinal stenosis      SURGICAL HISTORY   Past Surgical History:  Procedure Laterality Date   bone removal from pinkie toe     COLONOSCOPY WITH PROPOFOL N/A 08/30/2021   Procedure: COLONOSCOPY WITH PROPOFOL;  Surgeon: [name], MD;  Location: [facility] ENDOSCOPY;  Service: Gastroenterology;  Laterality: N/A;   DILATION AND CURETTAGE OF UTERUS     PULMONARY THROMBECTOMY N/A 03/07/2022   Procedure: PULMONARY THROMBECTOMY;  Surgeon: Algernon Huxley, MD;  Location: [place] [facility];  Service: Cardiovascular;  Laterality: N/A;   RIGHT HEART CATH Right 09/17/2018   Procedure: RIGHT HEART CATH;  Surgeon: Teodoro Spray, MD;  Location: [place] [facility];  Service: Cardiovascular;  Laterality: Right;   SPLENECTOMY     TUBAL LIGATION       FAMILY HISTORY   Family History  Problem Relation Age of Onset   Uterine cancer Mother    Stroke Father    Breast cancer Neg Hx      SOCIAL HISTORY   Social History   Tobacco Use   Smoking status: Former    Packs/day: 0.50    Types: Cigarettes    Quit date: 01/08/2011    Years since quitting: 11.1   Smokeless tobacco: Never  Vaping Use    Vaping Use: Never used  Substance Use Topics   Alcohol use: No   Drug use: No     MEDICATIONS    Home Medication:    Current Medication:  Current Facility-Administered Medications:    ambrisentan (LETAIRIS) tablet 5 mg, 5 mg, Oral, Daily, [name], Tranesha Lessner E, MD, 5 mg at 03/18/22 0926   amLODipine (NORVASC) tablet 10 mg, 10 mg, Oral, Daily, Dew, Erskine Squibb, MD, 10 mg at 03/18/22 7342   [COMPLETED] apixaban (ELIQUIS) tablet 10 mg, 10 mg, Oral, BID, 10 mg at 03/15/22 2043 **FOLLOWED BY** apixaban (ELIQUIS) tablet 5 mg, 5 mg, Oral, BID, [name], RPH, 5 mg at 03/18/22 8768   Chlorhexidine Gluconate Cloth 2 % PADS 6 each, 6 each, Topical, Q0600, Algernon Huxley, MD, 6 each at 03/18/22 1157   dextromethorphan-guaiFENesin (MUCINEX DM) 30-600 MG per 12 hr tablet 1 tablet, 1 tablet, Oral, BID, Manuella Ghazi, Pratik D, DO, 1 tablet at 03/18/22 0926   feeding supplement (ENSURE ENLIVE / ENSURE PLUS) liquid 237 mL, 237 mL, Oral, TID BM, [name], [name], DO, 237 mL at 03/18/22 0927   fluticasone (FLONASE) 50 MCG/ACT nasal spray 1 spray, 1 spray, Each Nare, Daily PRN, Algernon Huxley,  MD, 1 spray at 03/12/22 2153   furosemide (LASIX) tablet 40 mg, 40 mg, Oral, BID, Emeterio Reeve, DO, 40 mg at 03/18/22 9702   gabapentin (NEURONTIN) tablet 600 mg, 600 mg, Oral, TID, Lucky Cowboy, Erskine Squibb, MD, 600 mg at 03/18/22 6378   hydrocortisone cream 1 %, , Topical, QID PRN, Emeterio Reeve, DO   HYDROmorphone (DILAUDID) injection 1 mg, 1 mg, Intravenous, Once PRN, Lucky Cowboy, Erskine Squibb, MD   ipratropium-albuterol (DUONEB) 0.5-2.5 (3) MG/3ML nebulizer solution 3 mL, 3 mL, Nebulization, Q4H PRN, Emeterio Reeve, DO   methylPREDNISolone sodium succinate (SOLU-MEDROL) 125 mg/2 mL injection 80 mg, 80 mg, Intravenous, Q24H, [name], Nelida Mandarino E, MD, 80 mg at 03/17/22 2100   multivitamin with minerals tablet 1 tablet, 1 tablet, Oral, Daily, Emeterio Reeve, DO, 1 tablet at 03/18/22 5885   mycophenolate (CELLCEPT) capsule 500 mg, 500  mg, Oral, BID, Algernon Huxley, MD, 500 mg at 03/18/22 0277   Oral care mouth rinse, 15 mL, Mouth Rinse, PRN, Sharion Settler, NP   pantoprazole (PROTONIX) EC tablet 40 mg, 40 mg, Oral, Daily, Dew, Erskine Squibb, MD, 40 mg at 03/18/22 4128   senna-docusate (Senokot-S) tablet 1 tablet, 1 tablet, Oral, QHS PRN, Algernon Huxley, MD, 1 tablet at 03/09/22 1347   sodium chloride (OCEAN) 0.65 % nasal spray 1 spray, 1 spray, Each Nare, PRN, Max Sane, MD   tadalafil (PAH) (ADCIRCA) tablet 20 mg, 20 mg, Oral, Daily, Dew, Erskine Squibb, MD, 20 mg at 03/18/22 7867    ALLERGIES   Hydroxychloroquine and Meclizine     REVIEW OF SYSTEMS    Review of Systems:  Gen:  Denies  fever, sweats, chills weigh loss  HEENT: Denies blurred vision, double vision, ear pain, eye pain, hearing loss, nose bleeds, sore throat Cardiac:  No dizziness, chest pain or heaviness, chest tightness,edema Resp:   less  cough or sputum porduction, + shortness of breath, no wheezing, hemoptysis,  Gi: Denies swallowing difficulty, stomach pain, nausea or vomiting, diarrhea, constipation, bowel incontinence Gu:  Denies bladder incontinence, burning urine Ext:   Denies Joint pain, stiffness + swelling Skin: Denies  skin rash, easy bruising or bleeding or hives Endoc:  Denies polyuria, polydipsia , polyphagia or weight change Psych:   Denies depression, insomnia or hallucinations   Other:  All other systems negative   VS: BP 103/75 (BP Location: Right Arm)   Pulse (!) 102   Temp 98.9 F (37.2 C) (Oral)   Resp 20   Ht _0  (1.651 m)   Wt 68.4 kg   SpO2 90%   BMI 25.11 kg/m      PHYSICAL EXAM    GENERAL:NAD, no fevers, chills, no weakness no fatigue HEAD: Normocephalic, atraumatic.  EYES: Pupils equal, round, reactive to light. Extraocular muscles intact. No scleral icterus.  MOUTH: Moist mucosal membrane. Dentition intact. No abscess noted.  EAR, NOSE, THROAT: Clear without exudates. No external lesions.  NECK: Supple. No  thyromegaly. No nodules. No JVD.  PULMONARY: Diffuse crackles CARDIOVASCULAR: S1 and S2. Regular rate and rhythm. No murmurs, rubs, or gallops. 1-2 + edema. Pedal pulses 2+ bilaterally.  GASTROINTESTINAL: Soft, nontender, nondistended. No masses. Positive bowel sounds. No hepatosplenomegaly.  MUSCULOSKELETAL: No swelling, clubbing, or edema. Range of motion full in all extremities.  NEUROLOGIC: Cranial nerves II through XII are intact. No gross focal neurological deficits. Sensation intact. Reflexes intact.  SKIN: No ulceration, lesions, rashes, or cyanosis. Skin warm and dry. Turgor intact.  PSYCHIATRIC: Mood, affect within normal limits. The patient  is awake, alert and oriented x 3. Insight, judgment intact.       IMAGING    DG Chest Port 1 View  Result Date: 03/10/2022 CLINICAL DATA:  Leg swelling EXAM: PORTABLE CHEST 1 VIEW COMPARISON:  Chest x-ray dated March 06, 2022; chest CT dated March 07, 2022 FINDINGS: Unchanged cardiomegaly. Severe background emphysema with areas of nodularity, better evaluated on recent chest CT. No new focal consolidation. The visualized skeletal structures are unremarkable. IMPRESSION: Severe emphysema with no new focal consolidation. Electronically Signed   By: Yetta Glassman M.D.   On: 03/10/2022 10:25   PERIPHERAL VASCULAR CATHETERIZATION  Result Date: 03/07/2022 See surgical note for result.  ECHOCARDIOGRAM COMPLETE  Result Date: 03/07/2022    ECHOCARDIOGRAM REPORT   Patient Name:   Tammy Barrett Date of Exam: 03/07/2022 Medical Rec #:  [id number]        Height:       65.0 in Accession #:    [id number]       Weight:       142.4 lb Date of Birth:  [birth year]        BSA:          1.712 m Patient Age:    30 years         BP:           118/100 mmHg Patient Gender: F                HR:           95 bpm. Exam Location:  [facility] Procedure: 2D Echo, Cardiac Doppler and Color Doppler Indications:     Pulmonary Embolus I26.09  History:         Patient has no prior  history of Echocardiogram examinations.                  COPD and Pulmonary HTN; Risk Factors:Hypertension.  Sonographer:     Sherrie Sport Referring Phys:  [id number] AMY N COX Diagnosing Phys: Serafina Royals MD IMPRESSIONS  1. Left ventricular ejection fraction, by estimation, is 65 to 70%. The left ventricle has normal function. The left ventricle has no regional wall motion abnormalities. Left ventricular diastolic parameters were normal.  2. Right ventricular systolic function is mildly reduced. The right ventricular size is severely enlarged. Moderately increased right ventricular wall thickness. There is severely elevated pulmonary artery systolic pressure.  3. Right atrial size was severely dilated.  4. The mitral valve is normal in structure. Trivial mitral valve regurgitation.  5. Tricuspid valve regurgitation is severe.  6. The aortic valve is normal in structure. Aortic valve regurgitation is trivial. FINDINGS  Left Ventricle: Left ventricular ejection fraction, by estimation, is 65 to 70%. The left ventricle has normal function. The left ventricle has no regional wall motion abnormalities. The left ventricular internal cavity size was small. There is no left ventricular hypertrophy. Left ventricular diastolic parameters were normal. Right Ventricle: The right ventricular size is severely enlarged. Moderately increased right ventricular wall thickness. Right ventricular systolic function is mildly reduced. There is severely elevated pulmonary artery systolic pressure. Left Atrium: Left atrial size was normal in size. Right Atrium: Right atrial size was severely dilated. Pericardium: There is no evidence of pericardial effusion. Mitral Valve: The mitral valve is normal in structure. Trivial mitral valve regurgitation. Tricuspid Valve: The tricuspid valve is normal in structure. Tricuspid valve regurgitation is severe. Aortic Valve: The aortic valve is normal in structure. Aortic valve regurgitation is trivial.  Aortic  valve mean gradient measures 2.0 mmHg. Aortic valve peak gradient measures 2.8 mmHg. Aortic valve area, by VTI measures 2.37 cm. Pulmonic Valve: The pulmonic valve was normal in structure. Pulmonic valve regurgitation is mild. Aorta: The aortic root and ascending aorta are structurally normal, with no evidence of dilitation. IAS/Shunts: No atrial level shunt detected by color flow Doppler.  LEFT VENTRICLE PLAX 2D LVIDd:         2.60 cm     Diastology LVIDs:         2.00 cm     LV e' medial:    5.87 cm/s LV PW:         1.20 cm     LV E/e' medial:  6.0 LV IVS:        1.20 cm     LV e' lateral:   11.30 cm/s LVOT diam:     2.00 cm     LV E/e' lateral: 3.1 LV SV:         25 LV SV Index:   15 LVOT Area:     3.14 cm  LV Volumes (MOD) LV vol d, MOD A4C: 17.0 ml LV vol s, MOD A4C: 10.5 ml LV SV MOD A4C:     17.0 ml RIGHT VENTRICLE RV Basal diam:  4.30 cm RV S prime:     7.72 cm/s TAPSE (M-mode): 1.6 cm LEFT ATRIUM             Index        RIGHT ATRIUM           Index LA diam:        3.40 cm 1.99 cm/m   RA Area:     42.90 cm LA Vol (A2C):   67.6 ml 39.48 ml/m  RA Volume:   218.00 ml 127.31 ml/m LA Vol (A4C):   17.7 ml 10.34 ml/m LA Biplane Vol: 35.7 ml 20.85 ml/m  AORTIC VALVE AV Area (Vmax):    2.32 cm AV Area (Vmean):   2.02 cm AV Area (VTI):     2.37 cm AV Vmax:           83.20 cm/s AV Vmean:          59.100 cm/s AV VTI:            0.106 m AV Peak Grad:      2.8 mmHg AV Mean Grad:      2.0 mmHg LVOT Vmax:         61.40 cm/s LVOT Vmean:        38.000 cm/s LVOT VTI:          0.080 m LVOT/AV VTI ratio: 0.75  AORTA Ao Root diam: 3.15 cm MITRAL VALVE               TRICUSPID VALVE MV Area (PHT): 8.92 cm    TR Peak grad:   74.0 mmHg MV Decel Time: 85 msec     TR Vmax:        430.00 cm/s MV E velocity: 35.10 cm/s MV A velocity: 74.60 cm/s  SHUNTS MV E/A ratio:  0.47        Systemic VTI:  0.08 m                            Systemic Diam: 2.00 cm Serafina Royals MD Electronically signed by Serafina Royals MD  Signature Date/Time: 03/07/2022/12:58:25 PM    Final    CT  CHEST W CONTRAST  Result Date: 03/07/2022 CLINICAL DATA:  Soft tissue mass. EXAM: CT CHEST WITH CONTRAST TECHNIQUE: Multidetector CT imaging of the chest was performed during intravenous contrast administration. RADIATION DOSE REDUCTION: This exam was performed according to the departmental dose-optimization program which includes automated exposure control, adjustment of the mA and/or kV according to patient size and/or use of iterative reconstruction technique. CONTRAST:  60m OMNIPAQUE IOHEXOL 300 MG/ML  SOLN COMPARISON:  CTA of the chest of March 06, 2022. FINDINGS: Cardiovascular: As noted on the exam performed yesterday, there is again noted large saddle embolus extending from proximal right pulmonary artery and through left pulmonary artery into the left lower lobe branches. This is not significantly changed compared to yesterday. Atherosclerosis of thoracic aorta is noted without aneurysm or dissection. No pericardial effusion is noted. Mild cardiomegaly is noted. Mediastinum/Nodes: Thyroid gland is unremarkable. The esophagus appears normal. 2.3 cm right hilar lymph node is noted. Lungs/Pleura: No pneumothorax or pleural effusion is noted. Emphysematous disease and scarring is noted throughout both lungs. Stable 5 mm nodule is noted in right lower lobe best seen on image number 73 of series 3. Stable 3 mm nodule is noted in left upper lobe best seen on image number 39 of series 3. Stable 3 mm nodules also seen in left upper lobe best seen on image number 49 of series 3. Upper Abdomen: No acute abnormality. Musculoskeletal: No chest wall abnormality. No acute or significant osseous findings. IMPRESSION: There is stable large pulmonary embolus extending primarily through the left pulmonary artery and into the left lower lobe branches as described on prior exam. 2.3 cm right hilar lymph node is noted which may be inflammatory in etiology, but  neoplasm cannot be excluded. Follow-up CT scan with intravenous contrast is recommended in 2-3 months to evaluate stability. Extensive emphysematous disease and scarring is noted throughout both lungs as described on prior exam. Multiple pulmonary nodules are again noted as described on prior exam performed yesterday, with the largest measuring 5 mm in the right lower lobe. No follow-up needed if patient is low-risk (and has no known or suspected primary neoplasm). Non-contrast chest CT can be considered in 12 months if patient is high-risk. This recommendation follows the consensus statement: Guidelines for Management of Incidental Pulmonary Nodules Detected on CT Images: From the Fleischner Society 2017; Radiology 2017; 284:228-243. Aortic Atherosclerosis (ICD10-I70.0) and Emphysema (ICD10-J43.9). Electronically Signed   By: JMarijo ConceptionM.D.   On: 03/07/2022 11:14   UKoreaVenous Img Lower Bilateral (DVT)  Result Date: 03/07/2022 CLINICAL DATA:  Patient with pulmonary embolism.  Evaluate for DVT. EXAM: BILATERAL LOWER EXTREMITY VENOUS DOPPLER ULTRASOUND TECHNIQUE: Gray-scale sonography with graded compression, as well as color Doppler and duplex ultrasound were performed to evaluate the lower extremity deep venous systems from the level of the common femoral vein and including the common femoral, femoral, profunda femoral, popliteal and calf veins including the posterior tibial, peroneal and gastrocnemius veins when visible. The superficial great saphenous vein was also interrogated. Spectral Doppler was utilized to evaluate flow at rest and with distal augmentation maneuvers in the common femoral, femoral and popliteal veins. COMPARISON:  None Available. FINDINGS: RIGHT LOWER EXTREMITY Common Femoral Vein: No evidence of thrombus. Normal compressibility, respiratory phasicity and response to augmentation. Saphenofemoral Junction: No evidence of thrombus. Normal compressibility and flow on color Doppler  imaging. Profunda Femoral Vein: No evidence of thrombus. Normal compressibility and flow on color Doppler imaging. Femoral Vein: No evidence of thrombus. Normal compressibility, respiratory  phasicity and response to augmentation. Popliteal Vein: No evidence of thrombus. Normal compressibility, respiratory phasicity and response to augmentation. Calf Veins: No evidence of thrombus. Normal compressibility and flow on color Doppler imaging. Other Findings:  None. LEFT LOWER EXTREMITY Common Femoral Vein: No evidence of thrombus. Normal compressibility, respiratory phasicity and response to augmentation. Saphenofemoral Junction: No evidence of thrombus. Normal compressibility and flow on color Doppler imaging. Profunda Femoral Vein: No evidence of thrombus. Normal compressibility and flow on color Doppler imaging. Femoral Vein: No evidence of thrombus. Normal compressibility, respiratory phasicity and response to augmentation. Popliteal Vein: No evidence of thrombus. Normal compressibility, respiratory phasicity and response to augmentation. Calf Veins: No evidence of thrombus. Normal compressibility and flow on color Doppler imaging. Other Findings:  None. IMPRESSION: No evidence of deep venous thrombosis in either lower extremity. Electronically Signed   By: Markus Daft M.D.   On: 03/07/2022 07:29   CT Angio Chest PE W/Cm &/Or Wo Cm  Result Date: 03/06/2022 CLINICAL DATA:  CT Abdomen Pelvis W Contrast suspected, high prob Abdominal pain, acute, nonlocalized EXAM: CT ANGIOGRAPHY CHEST WITH CONTRAST TECHNIQUE: Multidetector CT imaging of the chest was performed using the standard protocol during bolus administration of intravenous contrast. Multiplanar CT image reconstructions and MIPs were obtained to evaluate the vascular anatomy. RADIATION DOSE REDUCTION: This exam was performed according to the departmental dose-optimization program which includes automated exposure control, adjustment of the mA and/or kV  according to patient size and/or use of iterative reconstruction technique. CONTRAST:  28m OMNIPAQUE IOHEXOL 350 MG/ML SOLN COMPARISON:  CT chest 03/05/2020 FINDINGS: Cardiovascular: Satisfactory opacification of the pulmonary arteries to the segmental level. Nonocclusive saddle embolus extending to the left lower lobe segmental and subsegmental levels as well as into the proximal right main pulmonary artery. Enlarged right heart with marked enlargement of the right atria. There is increased right to left ventricular ratio of at least 1.5. Retrograde reflux of intravenous contrast into a dilated inferior vena cava and hepatic veins. The main pulmonary artery is enlarged measuring up to 3.7 cm. No significant pericardial effusion. The thoracic aorta is normal in caliber. No atherosclerotic plaque of the thoracic aorta. No coronary artery calcifications. Mediastinum/Nodes: No enlarged mediastinal, hilar, or axillary lymph nodes. Thyroid gland, trachea, and esophagus demonstrate no significant findings. Lungs/Pleura: Severe centrilobular and paraseptal emphysematous changes. Scattered areas of reticulations, scarring, and bronchiectasis. No focal consolidation. 0.8 x 0.4 cm right middle lobe pulmonary nodule. Adjacent 0.7 x 0.6 cm ground-glass subpleural nodule (6:49). 0.5 x 0.5 cm left upper lobe pulmonary nodule (6:33). Left upper lobe pulmonary micronodule (6:27). Several other scattered pulmonary micronodules. No pulmonary mass. No pleural effusion. No pneumothorax. Upper Abdomen: Please see separately dictated CT abdomen pelvis 03/06/2022 Musculoskeletal: Subcutaneus soft tissue edema. No suspicious lytic or blastic osseous lesions. No acute displaced fracture. Multilevel degenerative changes of the spine. Chronic appearing minimal vertebral body height loss of the L4-L5 level. Review of the MIP images confirms the above findings. IMPRESSION: 1. Nonocclusive saddle pulmonary embolus extending to the left lower  lobe segmental and subsegmental levels as well as into the proximal right main pulmonary artery. Associated right heart strain. No pulmonary infarction. 2.  Emphysema (ICD10-J43.9) - severe. 3. Scattered pulmonary micronodules as well as a 0.8 x 0.4 cm right middle lobe pulmonary nodule and adjacent 0.7 x 0.6 cm ground-glass subpleural nodule. Initial follow-up with CT at 6 months is recommended to confirm persistence. If persistent, repeat CT is recommended every 2 years until 5 years of stability  has been established. This recommendation follows the consensus statement: Guidelines for Management of Incidental Pulmonary Nodules Detected on CT Images: From the Fleischner Society 2017; Radiology 2017; 284:228-243. These results were called by telephone at the time of interpretation on 03/06/2022 at 3:21 pm to provider [facility] , who verbally acknowledged these results. Electronically Signed   By: Iven Finn M.D.   On: 03/06/2022 15:32   CT Abdomen Pelvis W Contrast  Result Date: 03/06/2022 CLINICAL DATA:  Abdominal pain EXAM: CT ABDOMEN AND PELVIS WITH CONTRAST TECHNIQUE: Multidetector CT imaging of the abdomen and pelvis was performed using the standard protocol following bolus administration of intravenous contrast. RADIATION DOSE REDUCTION: This exam was performed according to the departmental dose-optimization program which includes automated exposure control, adjustment of the mA and/or kV according to patient size and/or use of iterative reconstruction technique. CONTRAST:  84m OMNIPAQUE IOHEXOL 350 MG/ML SOLN COMPARISON:  None Available. FINDINGS: Lower chest: Centrilobular and panlobular emphysema is seen in the lower lung fields. Prominence of interstitial markings in the lower lung fields suggest scarring. Heart is enlarged in size. Hepatobiliary: Liver measures 18.3 cm in length. There is fatty infiltration. There is no dilation of bile ducts. Small amount of fluid adjacent to the  gallbladder may be part of ascites. There is no significant wall thickening in gallbladder. Gallbladder is not distended. Pancreas: No focal abnormalities are seen. Spleen: Spleen is not seen. Surgical clips on the left hemidiaphragms suggest possible previous splenectomy. Adrenals/Urinary Tract: There is mild hyperplasia of left adrenal. There is no hydronephrosis. There are no renal or ureteral stones. Urinary bladder is not distended. Stomach/Bowel: Stomach is not distended. Small bowel loops are not dilated. Appendix is not seen. There is no significant wall thickening in colon. Vascular/Lymphatic: Unremarkable. Reproductive: Unremarkable. Other: Small ascites is present. There is no pneumoperitoneum. Left inguinal hernia containing fat is seen. There is subcutaneous edema suggesting anasarca. Musculoskeletal: Slight decrease in height of upper end plate of body of L4 vertebra may be residual from previous injury. Degenerative changes are noted in the lumbar spine, more so at L4-L5 and L5-S1 levels. There is minimal retrolisthesis at L4-L5 level. IMPRESSION: There is no evidence of intestinal obstruction or pneumoperitoneum. There is no hydronephrosis. Enlarged fatty liver. Small ascites. Severe COPD. Lumbar spondylosis. Electronically Signed   By: PElmer PickerM.D.   On: 03/06/2022 15:23   DG Chest 2 View  Result Date: 03/06/2022 CLINICAL DATA:  Shortness of breath. EXAM: CHEST - 2 VIEW COMPARISON:  CT scan of March 05, 2020. FINDINGS: Mild cardiomegaly is noted. Emphysematous disease is noted throughout both lungs as well as coarse reticular opacities concerning for scarring or fibrosis. No definite acute abnormality is noted. Bony thorax is unremarkable. IMPRESSION: Emphysematous changes noted bilaterally as well as coarse reticular opacities bilaterally concerning for fibrosis or scarring. No definite acute abnormality is noted. Aortic Atherosclerosis (ICD10-I70.0) and Emphysema (ICD10-J43.9).  Electronically Signed   By: JMarijo ConceptionM.D.   On: 03/06/2022 14:05      ASSESSMENT/PLAN   Acute on chronic hypoxia; multifactorial, came in with bilateral pulmonary emboli, in a lady who was not very active and significant underlying medical problems including; CTD, pulmonary fibrosis, pulmonary hypertension, severe copd with bullae and ground glass infiltrates. My concern was there also an IPF flare.   Bilateral pulmonary emboli. S/p mechanical thromboembolectomy. She is on anticoagulation- Eliquis. Base on her overall condition, limited ambulation, high risk of repeat clots, we discussed  most likely will keep her  on it indefinitely. 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 However, the increase ground glass infiltrate. ? Underlying IPF flare. Agree with resuming  mycophenolate 500 mg p.o. twice daily and solumedrol 50 mg iv q 12 hrs, lasix as you doing. 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 Will discuss with rheum ? Seeking authorization for out patient esbriet. Also will send to [facility] transplant clinic for possible lung transplant post discharge   Pulmonary hypertension. 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 Hence on adcirca. 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 Will add letaris and if need be tyvaso  Copd, with bullae dz. Stable No acute bronchospasm. Duo neg q 6 hr while awake. Incentive spirometry  Scattered micro nodules bilateral, 0.8 RML nodule, 0.7 cm LUL ground class nodule, will continue surveillance on the out side,anca, ace pending.     Thank you for allowing me to participate in the care of this patient.   Patient/Family are satisfied with care plan and all questions have been answered.  This document was prepared using Dragon voice recognition software and may include unintentional dictation errors.     Wallene Huh,  M.D.  Division of [place]

## 2022-03-18 NOTE — Progress Notes (Signed)
Occupational Therapy Treatment Patient Details Name: Tammy Barrett MRN: [id number] DOB: 17-Sep-1960 Today's Date: 03/18/2022   History of present illness Pt is a 61 y.o. female presenting to hospital 03/06/22 with c/o decreased appetite, some difficulty swallowing, and leg swelling.  Pt admitted with PE (large saddle pulmonary embolism with R heart strain), and acute on chronic respiratory failure with hypoxia.  S/p thrombectomy of saddle PE 03/07/22.  PMH includes htn, lupus, scleroderma, pulmonary fibrosis, pulmonary htn, mixed connective tissue disease, esophageal dysmotility, COPD, HSV, peripheral neuropathy, and Raynaud's disease.  O2 use as needed.   OT comments  Pt seen for OT tx this date. On 7L O2, denies pain, SOB, and agreeable to session. Pt tolerated standing for grooming tasks with setup and supervision, on 7L O2 and desats with <13mn standing to 85% SpO2, requiring >560m to recover back up to 90% including standing and then sitting rest break. Additional activity deferred. Pt denied SOB throughout but did recognize slight increase in heavy breathing with the activity. Pt educated in activity pacing with exertional activity to support recovery and minimize over exertion. Pt progressing towards goals, now requiring much less O2. Continue to recommend SNF at this time for poor activity tolerance, cardiopulmonary status with activity, and decreased strength.    Recommendations for follow up therapy are one component of a multi-disciplinary discharge planning process, led by the attending physician.  Recommendations may be updated based on patient status, additional functional criteria and insurance authorization.    Follow Up Recommendations  Skilled nursing-short term rehab (<3 hours/day)    Assistance Recommended at Discharge Intermittent Supervision/Assistance  Patient can return home with the following  A little help with walking and/or transfers;A little help with  bathing/dressing/bathroom;Assistance with cooking/housework;Help with stairs or ramp for entrance;Assist for transportation   Equipment Recommendations  Tub/shower seat    Recommendations for Other Services      Precautions / Restrictions Precautions Precautions: Fall Precaution Comments: Mod Fall Risk Restrictions Weight Bearing Restrictions: No       Mobility Bed Mobility               General bed mobility comments: Deferred (pt in recliner beginning/end of session)    Transfers Overall transfer level: Needs assistance Equipment used: Rolling walker (2 wheels) Transfers: Sit to/from Stand Sit to Stand: Supervision                 Balance Overall balance assessment: Needs assistance Sitting-balance support: No upper extremity supported, Feet supported Sitting balance-Leahy Scale: Good     Standing balance support: During functional activity, Bilateral upper extremity supported, Single extremity supported, No upper extremity supported Standing balance-Leahy Scale: Fair                             ADL either performed or assessed with clinical judgement   ADL Overall ADL's : Needs assistance/impaired     Grooming: Wash/dry face;Oral care;Standing;Set up;Supervision/safety Grooming Details (indicate cue type and reason): Pt tolerated standing for grooming tasks with setup and supervision, on 7L O2 and desats with <52m72mstanding to 85% SpO2, requiring >52mi54mo recover back up to 90% including standing and then sitting rest break. Additional activity deferred. Pt denied SOB throughout but did recognize slight increase in heavy breathing with the activity.  Extremity/Trunk Assessment              Vision       Perception     Praxis      Cognition Arousal/Alertness: Awake/alert Behavior During Therapy: WFL for tasks assessed/performed Overall Cognitive Status: Within Functional Limits  for tasks assessed                                          Exercises      Shoulder Instructions       General Comments      Pertinent Vitals/ Pain       Pain Assessment Pain Assessment: No/denies pain  Home Living                                          Prior Functioning/Environment              Frequency  Min 2X/week        Progress Toward Goals  OT Goals(current goals can now be found in the care plan section)  Progress towards OT goals: Progressing toward goals  Acute Rehab OT Goals Patient Stated Goal: to get stronger OT Goal Formulation: With patient Time For Goal Achievement: 03/24/22 Potential to Achieve Goals: Good  Plan Discharge plan remains appropriate;Frequency remains appropriate    Co-evaluation                 AM-PAC OT "6 Clicks" Daily Activity     Outcome Measure   Help from another person eating meals?: None Help from another person taking care of personal grooming?: A Little Help from another person toileting, which includes using toliet, bedpan, or urinal?: A Little Help from another person bathing (including washing, rinsing, drying)?: A Little Help from another person to put on and taking off regular upper body clothing?: A Little Help from another person to put on and taking off regular lower body clothing?: A Little 6 Click Score: 19    End of Session Equipment Utilized During Treatment: Rolling walker (2 wheels);Oxygen  OT Visit Diagnosis: Unsteadiness on feet (R26.81);Muscle weakness (generalized) (M62.81)   Activity Tolerance Patient tolerated treatment well   Patient Left in chair;with call bell/phone within reach;with family/visitor present   Nurse Communication          Time: 3086-5784 OT Time Calculation (min): 13 min  Charges: OT General Charges $OT Visit: 1 Visit OT Treatments $Self Care/Home Management : 8-22 mins  Ardeth Perfect., MPH, MS, OTR/L ascom  [phone] 03/18/22, 12:44 PM

## 2022-03-19 DIAGNOSIS — I2602 Saddle embolus of pulmonary artery with acute cor pulmonale: Secondary | ICD-10-CM | POA: Diagnosis not present

## 2022-03-19 NOTE — Progress Notes (Signed)
Physical Therapy Treatment Patient Details Name: Tammy Barrett MRN: [id number] DOB: May 22, 1961 Today's Date: 03/19/2022   History of Present Illness Pt is a 61 y.o. female presenting to hospital 03/06/22 with c/o decreased appetite, some difficulty swallowing, and leg swelling.  Pt admitted with PE (large saddle pulmonary embolism with R heart strain), and acute on chronic respiratory failure with hypoxia.  S/p thrombectomy of saddle PE 03/07/22.  PMH includes htn, lupus, scleroderma, pulmonary fibrosis, pulmonary htn, mixed connective tissue disease, esophageal dysmotility, COPD, HSV, peripheral neuropathy, and Raynaud's disease.  O2 use as needed.    PT Comments    Patient remains motivated to participate and progress as able; continues to be limited by SOB with gait and exertional activities. All mobility performed at supervision level; do recommend continued use of RW for overall energy conservation.  Pulmonology in room prior to session; weaned to 5L at rest (93-96%).  With gait, increased to 6L, with desat to 83-84% after 35' distance.  Requires seated rest break for recovery, but does rebound to >90% within 2-3 min of seated rest and pursed lip breathing.  Left on 5L end of session, 92%.  RN informed/aware of outcomes.     Recommendations for follow up therapy are one component of a multi-disciplinary discharge planning process, led by the attending physician.  Recommendations may be updated based on patient status, additional functional criteria and insurance authorization.  Follow Up Recommendations  Skilled nursing-short term rehab (<3 hours/day)     Assistance Recommended at Discharge Frequent or constant Supervision/Assistance  Patient can return home with the following A little help with walking and/or transfers;A little help with bathing/dressing/bathroom;Assistance with cooking/housework;Assist for transportation;Help with stairs or ramp for entrance   Equipment  Recommendations  Rolling walker (2 wheels);BSC/3in1    Recommendations for Other Services       Precautions / Restrictions Precautions Precautions: Fall Precaution Comments: Mod Fall Risk Restrictions Weight Bearing Restrictions: No     Mobility  Bed Mobility               General bed mobility comments: seated in recliner beginning/end of treatment session    Transfers Overall transfer level: Needs assistance Equipment used: Rolling walker (2 wheels) Transfers: Sit to/from Stand Sit to Stand: Supervision                Ambulation/Gait Ambulation/Gait assistance: Supervision Gait Distance (Feet): 35 Feet Assistive device: Rolling walker (2 wheels)         General Gait Details: reciprocal stepping pattern; slow and guarded, but good overall stability.  Continues to desat to 84% on 6L, requiring 2-3 min seated rest for recovery >90%   Stairs             Wheelchair Mobility    Modified Rankin (Stroke Patients Only)       Balance Overall balance assessment: Needs assistance Sitting-balance support: No upper extremity supported, Feet supported Sitting balance-Leahy Scale: Good Sitting balance - Comments: no LOB during grooming tasks standing without UE support   Standing balance support: During functional activity, Bilateral upper extremity supported, Single extremity supported, No upper extremity supported Standing balance-Leahy Scale: Fair                              Cognition Arousal/Alertness: Awake/alert Behavior During Therapy: WFL for tasks assessed/performed Overall Cognitive Status: Within Functional Limits for tasks assessed  Exercises Other Exercises Other Exercises: Standing LE therex, 1x10, with emphasis on deep breaths/respiratory support during activity: heel raises, marching, hip flex/ext/abduct/adduct; sit/stand x5.  All performed with supervision;  extended rest periods throughout as needed due to SOB/desat    General Comments        Pertinent Vitals/Pain Pain Assessment Pain Assessment: No/denies pain    Home Living                          Prior Function            PT Goals (current goals can now be found in the care plan section) Acute Rehab PT Goals Patient Stated Goal: to go home PT Goal Formulation: With patient Time For Goal Achievement: 03/24/22 Potential to Achieve Goals: Good Progress towards PT goals: Progressing toward goals    Frequency    Min 2X/week      PT Plan Current plan remains appropriate    Co-evaluation              AM-PAC PT "6 Clicks" Mobility   Outcome Measure  Help needed turning from your back to your side while in a flat bed without using bedrails?: None Help needed moving from lying on your back to sitting on the side of a flat bed without using bedrails?: A Little Help needed moving to and from a bed to a chair (including a wheelchair)?: A Little Help needed standing up from a chair using your arms (e.g., wheelchair or bedside chair)?: A Little Help needed to walk in hospital room?: A Little Help needed climbing 3-5 steps with a railing? : A Little 6 Click Score: 19    End of Session Equipment Utilized During Treatment: Gait belt;Oxygen Activity Tolerance: Patient tolerated treatment well Patient left: in chair;with call bell/phone within reach;with family/visitor present Nurse Communication: Mobility status;Other (comment) PT Visit Diagnosis: Unsteadiness on feet (R26.81);Muscle weakness (generalized) (M62.81);Other abnormalities of gait and mobility (R26.89)     Time: 1010-1038 PT Time Calculation (min) (ACUTE ONLY): 28 min  Charges:  $Gait Training: 8-22 mins $Therapeutic Exercise: 8-22 mins                    Sherrina Zaugg H. Owens Shark, PT, DPT, NCS 03/19/22, 10:52 AM [phone]

## 2022-03-19 NOTE — Progress Notes (Signed)
Progress Note   Patient: Tammy Barrett BHA:[id number] DOB: 1961/03/15 DOA: 03/06/2022     13 DOS: the patient was seen and examined on 03/19/2022   Brief hospital course: [name] is a 61 year old female with hypertension, neuropathy, GERD, pulmonary hypertension, scleroderma, COPD, hepatosteatosis, mixed connective tissue disease, long-term use of immunosuppressant medication, pulmonary fibrosis, who presents to the emergency department 03/06/2022 for chief concerns of poor appetite. [name] 08/10: Initial vitals heart rate of 103, blood pressure 123/103, SpO2 of 72% on room air with improvement to 95% on 5 L nasal cannula. BNP was elevated at 1738.4, high sensitive troponin was 40, creatinine 1.24.  No concerns on CT abdomen/pelvis.  CTA chest (+)Nonocclusive saddle pulmonary embolus extending to the left lower lobe segmental and subsegmental levels as well as into the proximal right main pulmonary artery. Associated right heart strain. No pulmonary infarction.  Severe emphysema, multiple pulmonary nodules to follow-up outpatient. Heparin GTT per pharmacy was initiated.  Patient was admitted to hospitalist service stepdown status with vascular consult for saddle PE. EDP had already consulted vascular who recommended heparin bolus and patient to be taken to the OR for thrombectomy in the a.m.  . 08/11: thrombectomy planned. I was contacted by radiologist Dr. Denna Haggard this morning and due to abnormal appearance of presumed clot and no evidence for DVT as well as significantly dilated RA, radiology is recommending CT chest with contrast to evaluate for potential mediastinal mass as opposed to pulmonary embolus, prior to taking her for thrombectomy.  Confirmed PE.  Also noted 2.3 cm right hilar lymph node, possible inflammatory, recommending follow-up CT with IV contrast 2 to 3 months. Echocardiogram --> LVEF 65 to 70%, normal function, normal diastolic parameters.  Right ventricular systolic function  mildly reduced, moderately increased RV wall thickness, severely elevated pulmonary artery systolic pressure, right atrial size severely dilated.  Underwent thrombolysis/thrombectomy.  [name] 08/12: Remains on heparin drip.  HFNC - requiring 10 L/min this morning but improved to 5 L/min later in the day. Pt reports SOB is overall better. Transferred to progressive status.  [name] 08/13: still requiring 6 /min O2. D/c heparin gtt 48 hours after thrombectomy and transition Eliquis. RN voiced concern low UOP, continue strict I&O and encouraged po intake  . 08/14: requiring 8 L/min overnight and maintaining on 5 L/min O2 this morning, no output documented from yesterday. Rales/coarse breath sounds and JVD --> Lasix 60 mg IV x1 and restart home lasix. CXR obtained to futher eval possible underlying pneumonia - images reviewed, stable cardiomegaly and severe background emphysema no new consolidations.  [name] 08/15: paged pulmonary Dr [name] 10:53 AM for routine consult. Pt appears fluid overloaded based on LE edema, rales, JVD but given pulm HTN and underlying lung disease will also seek pulmonary recs if any other interventions might be helpful. Continue lasix for now and strict I&O, daily weights.  [name] 08/16: still needing 9L/min, confirmed pulmonary to see her today.  . 8/17: On 8 L nasal cannula oxygen.  Pulmonary following . 8/18: on high flow with FiO2 of 35% and tolerating well with sat in mid-high 90's . 8/19: Continues on high flow with FiO2 45%.  Continues to have cough with whitish sputum production. . 8/20: Able to wean back to 8 L of oxygen.  Uses 3 L at baseline.  PT/OT are recommending SNF . 8/21: Clinically seems stable, at 7 L of oxygen. . 8/22: Remains stable and able to wean to 5 L. . 8/23: Remains stable, apparently was put  back to 7 L overnight, able to wean her back to 5 L again.  Patient do show some transient desaturation with ambulation, discussed with pulmonary and they are okay with transient  desaturation and keeping the saturation around 88%. . Had a bed offer-pending insurance authorization   Assessment and Plan: * Pulmonary emboli ([name])  S/p mechanical thrombectomy by vascular surgery.  Initially treated with heparin now transition to Eliquis.  Echocardiogram --> LVEF 65 to 70%, normal function, normal diastolic parameters.  Right ventricular systolic function mildly reduced, moderately increased RV wall thickness, severely elevated pulmonary artery systolic pressure, right atrial size severely dilated.  DVT negative.  Acute on chronic respiratory failure with hypoxia ([name]) Secondary to pulmonary emboli Patient at baseline wears 3 L oxygen, currently on 5 L supplementation as needed for emphysema   Continue oxygen supplementation to maintain SpO2 greater than 92%  Try weaning as tolerated  Current continue Lasix 40 mg p.o. twice daily  Pulmonology following    Pulmonary fibrosis ([place]) Pulmonary hypertension  Pulmonology following  Continue home tadalafil 20 mg daily.  Pulmonary hypertension ([name]) Seems stable.  Pulmonology following, he has started patient on Ambrisentan and tadalafil   Continue Lasix 40 mg po bid, strict I&O  Mixed connective tissue disease ([name])  status post Plaquenil, prednisone, Imuran  Currently on mycophenolate 500 mg p.o. twice daily.  Solu-Medrol 80 mg IV daily  Lymphadenopathy, mediastinal 2.3 cm right hilar lymph node, possible inflammatory, noted on CT chest 03/07/22   Outpatient follow-up CT with IV contrast 2 to 3 months  (HFpEF) heart failure with preserved ejection fraction ([place]) 03/16/22 evidenced by rales, LE edema, JVD 08/11 Echocardiogram --> LVEF 65 to 70%, normal function, normal diastolic parameters.  Right ventricular systolic function mildly reduced, moderately increased RV wall thickness, severely elevated pulmonary artery systolic pressure, right atrial size severely dilated.  Continue Lasix 40 mg p.o.  twice daily  CXR no significant pulmonary edema  Pulmonology following    Protein-calorie malnutrition, severe Complicates overall prognosis. -Dietitian consult  Emphysema, spiromety in office is c/w mild obstruction ([place]) Extensive lung disease, see above for pulmonary fibrosis On IV Solu-Medrol and nebulizer along with inhalers No home inhalers Not compliant w/ home O2  Pulmonology following  DuoNeb prn    Subjective: Patient was seen and examined today.  No new complaints.  Physical Exam: Vitals:   03/19/22 1055 03/19/22 1159 03/19/22 1201 03/19/22 1518  BP: 107/70  103/69 105/69  Pulse: 95 99 97 (!) 101  Resp: _0 Temp: 98.6 F (37 C)   98.5 F (36.9 C)  TempSrc: Oral   Oral  SpO2: 95% 94% 95% 93%  Weight:      Height:       General.  Well-developed lady, in no acute distress. Pulmonary.  Scattered dry crackles bilaterally, normal respiratory effort. CV.  Regular rate and rhythm, no JVD, rub or murmur. Abdomen.  Soft, nontender, nondistended, BS positive. CNS.  Alert and oriented .  No focal neurologic deficit. Extremities.  No edema, no cyanosis, pulses intact and symmetrical. Psychiatry.  Judgment and insight appears normal.  Data Reviewed: Prior data reviewed  Family Communication: Discussed with patient  Disposition: Status is: Inpatient Remains inpatient appropriate because:-Pending insurance authorization for SNF   Planned Discharge Destination: Skilled nursing facility  DVT prophylaxis.  Eliquis Time spent: 42 minutes  This record has been created using Systems analyst. Errors have been sought and corrected,but may not always be located. Such  creation errors do not reflect on the standard of care.  Author: Lorella Nimrod, MD 03/19/2022 3:53 PM  For on call review [email or web address].

## 2022-03-19 NOTE — Progress Notes (Signed)
Nutrition Follow-up  DOCUMENTATION CODES:   Severe malnutrition in context of chronic illness  INTERVENTION:   -Continue Ensure Enlive po TID, each supplement provides 350 kcal and 20 grams of protein -Continue MVI with minerals daily -Continue with liberalized diet of regular  NUTRITION DIAGNOSIS:   Severe Malnutrition related to chronic illness (COPD, scleroderma) as evidenced by moderate fat depletion, severe fat depletion, moderate muscle depletion, severe muscle depletion.  Ongoing  GOAL:   Patient will meet greater than or equal to 90% of their needs  Progressing  MONITOR:   PO intake, Supplement acceptance  REASON FOR ASSESSMENT:   Malnutrition Screening Tool    ASSESSMENT:   Pt with PMH hypertension, neuropathy, GERD, pulmonary hypertension, scleroderma, COPD, hepatosteatosis, mixed connective tissue disease, long-term use of immunosuppressant medication, pulmonary fibrosis, who presents for chief concerns of poor appetite.  8/11- s/p thrombectomy  Reviewed I/O's: +720 ml x 24 hours and +5.2 L since admission  Spoke with pt, who was sitting in recliner chair at time of visit. Pt looks physically much better in comparison to prior visit and pt confirms that she feels better. Per pt, her appetite is improving and meal completions 50-100%. Pt is drinking her Ensure supplements and enjoys them.    Discussed importance of good meal and supplement intake to promote healing.   Plan to d/c to SNF once respiratory status improves.   Medications reviewed and include lasix.   Labs reviewed: CBGS: 126.    Diet Order:   Diet Order             Diet regular Room service appropriate? 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  Estimated Nutritional Needs:   Kcal:  2050-2250  Protein:  105-120 grams  Fluid:  > 2 L    Loistine Chance, RD, LDN, [place] Registered Dietitian II Certified Diabetes Care and Education Specialist Please refer to [facility] for RD and/or RD on-call/weekend/after hours pager

## 2022-03-19 NOTE — TOC Progression Note (Addendum)
Transition of Care [facility]) - Progression Note    Patient Details  Name: Tammy Barrett MRN: [id number] Date of Birth: 11-Sep-1960  Transition of Care [facility] CM/SW [place], LCSW Phone Number: 03/19/2022, 10:32 AM  Clinical Narrative:  Checking with facilities that made bed offers regarding their maximum oxygen requirements before giving offers to patient. [facility] maximum is 3-4 L. [facility] is checking. Left message for [facility] admissions coordinator. [facility] is considering making a bed offer but will re-evaluate once below 5 L.   11:27 am: Reviewed bed offers with patient. Asked [facility] to review referral. Told patient to choose second preference from those that have offered.  2:40 pm: [facility] and [facility]. [facility] will order a 10 L concentrator for her. Started Josem Kaufmann through [place] portal.  Expected Discharge Plan: [place] Barriers to Discharge: Continued Medical Work up  Expected Discharge Plan and Services Expected Discharge Plan: [employer] Choice: [place] arrangements for the past 2 months: Single Family Home                                       Social Determinants of Health (SDOH) Interventions    Readmission Risk Interventions     No data to display

## 2022-03-19 NOTE — Progress Notes (Signed)
Pulmonary Medicine          Date: 03/19/2022,   MRN# [id number] Tammy Barrett 14-Oct-1960       HISTORY OF PRESENT ILLNESS   Up in bed side chair. Hansen 02 at 7 liters. Comfortable. Dyspnea on minimum exertion. No wheezing, chest tightness. Able to speak in full sentences. Rehab placement being sought.   Bumped her down frpm 7 to 5 liters, sats holding at 96 % at rest.   PAST MEDICAL HISTORY   Past Medical History:  Diagnosis Date  . COPD (chronic obstructive pulmonary disease) ([place])   . Discoid lupus   . Eczema   . Esophageal dysmotility   . Genital herpes   . GERD (gastroesophageal reflux disease)   . HSV (herpes simplex virus) infection   . Hypertension   . Lumbago   . Lumbar disc disease   . Peripheral neuropathy   . Pulmonary fibrosis ([place])   . Pulmonary hypertension ([place])   . Raynaud's disease   . Sclerodactyly   . Scleroderma ([place])   . Spinal stenosis      SURGICAL HISTORY   Past Surgical History:  Procedure Laterality Date  . bone removal from pinkie toe    . COLONOSCOPY WITH PROPOFOL N/A 08/30/2021   Procedure: COLONOSCOPY WITH PROPOFOL;  Surgeon: [name], MD;  Location: [facility] ENDOSCOPY;  Service: Gastroenterology;  Laterality: N/A;  . DILATION AND CURETTAGE OF UTERUS    . PULMONARY THROMBECTOMY N/A 03/07/2022   Procedure: PULMONARY THROMBECTOMY;  Surgeon: Algernon Huxley, MD;  Location: [place] CV LAB;  Service: Cardiovascular;  Laterality: N/A;  . RIGHT HEART CATH Right 09/17/2018   Procedure: RIGHT HEART CATH;  Surgeon: Teodoro Spray, MD;  Location: [place] CV LAB;  Service: Cardiovascular;  Laterality: Right;  . SPLENECTOMY    . TUBAL LIGATION       FAMILY HISTORY   Family History  Problem Relation Age of Onset  . Uterine cancer Mother   . Stroke Father   . Breast cancer Neg Hx      SOCIAL HISTORY   Social History   Tobacco Use  . Smoking status: Former    Packs/day: 0.50    Types: Cigarettes    Quit date:  01/08/2011    Years since quitting: 11.2  . Smokeless tobacco: Never  Vaping Use  . Vaping Use: Never used  Substance Use Topics  . Alcohol use: No  . Drug use: No     MEDICATIONS    Home Medication:    Current Medication:  Current Facility-Administered Medications:  .  ambrisentan (LETAIRIS) tablet 5 mg, 5 mg, Oral, Daily, [name], Kalenna Millett E, MD, 5 mg at 03/19/22 0847 .  amLODipine (NORVASC) tablet 10 mg, 10 mg, Oral, Daily, Dew, Erskine Squibb, MD, 10 mg at 03/19/22 0848 .  [COMPLETED] apixaban (ELIQUIS) tablet 10 mg, 10 mg, Oral, BID, 10 mg at 03/15/22 2043 **FOLLOWED BY** apixaban (ELIQUIS) tablet 5 mg, 5 mg, Oral, BID, [name], [facility], 5 mg at 03/19/22 0848 .  Chlorhexidine Gluconate Cloth 2 % PADS 6 each, 6 each, Topical, Q0600, Algernon Huxley, MD, 6 each at 03/18/22 [phone]  dextromethorphan-guaiFENesin (MUCINEX DM) 30-600 MG per 12 hr tablet 1 tablet, 1 tablet, Oral, BID, Manuella Ghazi, Pratik D, DO, 1 tablet at 03/19/22 0847 .  feeding supplement (ENSURE ENLIVE / ENSURE PLUS) liquid 237 mL, 237 mL, Oral, TID BM, Emeterio Reeve, DO, 237 mL at 03/19/22 0848 .  fluticasone (FLONASE)  50 MCG/ACT nasal spray 1 spray, 1 spray, Each Nare, Daily PRN, Algernon Huxley, MD, 1 spray at 03/12/22 2153 .  furosemide (LASIX) tablet 40 mg, 40 mg, Oral, BID, Emeterio Reeve, DO, 40 mg at 03/19/22 0848 .  gabapentin (NEURONTIN) tablet 600 mg, 600 mg, Oral, TID, Lucky Cowboy, Erskine Squibb, MD, 600 mg at 03/19/22 0848 .  hydrocortisone cream 1 %, , Topical, QID PRN, Emeterio Reeve, DO .  HYDROmorphone (DILAUDID) injection 1 mg, 1 mg, Intravenous, Once PRN, Lucky Cowboy, Erskine Squibb, MD .  ipratropium-albuterol (DUONEB) 0.5-2.5 (3) MG/3ML nebulizer solution 3 mL, 3 mL, Nebulization, Q4H PRN, Emeterio Reeve, DO .  multivitamin with minerals tablet 1 tablet, 1 tablet, Oral, Daily, Emeterio Reeve, DO, 1 tablet at 03/19/22 0847 .  mycophenolate (CELLCEPT) capsule 500 mg, 500 mg, Oral, BID, Lucky Cowboy, Erskine Squibb, MD, 500 mg at  03/19/22 0847 .  Oral care mouth rinse, 15 mL, Mouth Rinse, PRN, Sharion Settler, NP .  pantoprazole (PROTONIX) EC tablet 40 mg, 40 mg, Oral, Daily, Dew, Erskine Squibb, MD, 40 mg at 03/19/22 0848 .  senna-docusate (Senokot-S) tablet 1 tablet, 1 tablet, Oral, QHS PRN, Algernon Huxley, MD, 1 tablet at 03/09/22 1347 .  sodium chloride (OCEAN) 0.65 % nasal spray 1 spray, 1 spray, Each Nare, PRN, Max Sane, MD .  tadalafil (PAH) (ADCIRCA) tablet 20 mg, 20 mg, Oral, Daily, Dew, Erskine Squibb, MD, 20 mg at 03/19/22 0847    ALLERGIES   Hydroxychloroquine and Meclizine     REVIEW OF SYSTEMS    Review of Systems:  Gen:  Denies  fever, sweats, chills weigh loss  HEENT: Denies blurred vision, double vision, ear pain, eye pain, hearing loss, nose bleeds, sore throat Cardiac:  No dizziness, chest pain or heaviness, chest tightness,edema Resp:   occasional cough or sputum porduction, + shortness of breath, +wheezing, hemoptysis,  Gi: Denies swallowing difficulty, stomach pain, nausea or vomiting, diarrhea, constipation, bowel incontinence Gu:  Denies bladder incontinence, burning urine Ext:   Denies Joint pain, stiffness or swelling Skin: Denies  skin rash, easy bruising or bleeding or hives Endoc:  Denies polyuria, polydipsia , polyphagia or weight change Psych:   Denies depression, insomnia or hallucinations   Other:  All other systems negative   VS: BP 107/74 (BP Location: Left Arm)   Pulse 83   Temp 98.7 F (37.1 C)   Resp 15   Ht _0  (1.651 m)   Wt 68.4 kg   SpO2 93%   BMI 25.11 kg/m      PHYSICAL EXAM    GENERAL:NAD, no fevers, chills, no weakness no fatigue HEAD: Normocephalic, atraumatic.  EYES: Pupils equal, round, reactive to light. Extraocular muscles intact. No scleral icterus.  MOUTH: Moist mucosal membrane. Dentition intact. No abscess noted.  EAR, NOSE, THROAT: Clear without exudates. No external lesions.  NECK: Supple. No thyromegaly. No nodules. No JVD.  PULMONARY:  Diffuse coarse rhonchi right sided +wheezes CARDIOVASCULAR: S1 and S2. Regular rate and rhythm. No murmurs, rubs, or gallops. No edema. Pedal pulses 2+ bilaterally.  GASTROINTESTINAL: Soft, nontender, nondistended. No masses. Positive bowel sounds. No hepatosplenomegaly.  MUSCULOSKELETAL: No swelling, clubbing, or edema. Range of motion full in all extremities.  NEUROLOGIC: Cranial nerves II through XII are intact. No gross focal neurological deficits. Sensation intact. Reflexes intact.  SKIN: No ulceration, lesions, rashes, or cyanosis. Skin warm and dry. Turgor intact.  PSYCHIATRIC: Mood, affect within normal limits. The patient is awake, alert and oriented x 3. Insight, judgment intact.  IMAGING    DG Chest Port 1 View  Result Date: 03/10/2022 CLINICAL DATA:  Leg swelling EXAM: PORTABLE CHEST 1 VIEW COMPARISON:  Chest x-ray dated March 06, 2022; chest CT dated March 07, 2022 FINDINGS: Unchanged cardiomegaly. Severe background emphysema with areas of nodularity, better evaluated on recent chest CT. No new focal consolidation. The visualized skeletal structures are unremarkable. IMPRESSION: Severe emphysema with no new focal consolidation. Electronically Signed   By: Yetta Glassman M.D.   On: 03/10/2022 10:25   PERIPHERAL VASCULAR CATHETERIZATION  Result Date: 03/07/2022 See surgical note for result.  ECHOCARDIOGRAM COMPLETE  Result Date: 03/07/2022    ECHOCARDIOGRAM REPORT   Patient Name:   ATLEY SCARBORO Date of Exam: 03/07/2022 Medical Rec #:  [id number]        Height:       65.0 in Accession #:    [id number]       Weight:       142.4 lb Date of Birth:  1961/06/06        BSA:          1.712 m Patient Age:    61 years         BP:           118/100 mmHg Patient Gender: F                HR:           95 bpm. Exam Location:  [facility] Procedure: 2D Echo, Cardiac Doppler and Color Doppler Indications:     Pulmonary Embolus I26.09  History:         Patient has no prior history of  Echocardiogram examinations.                  COPD and Pulmonary HTN; Risk Factors:Hypertension.  Sonographer:     Sherrie Sport Referring Phys:  [id number] AMY N COX Diagnosing Phys: Serafina Royals MD IMPRESSIONS  1. Left ventricular ejection fraction, by estimation, is 65 to 70%. The left ventricle has normal function. The left ventricle has no regional wall motion abnormalities. Left ventricular diastolic parameters were normal.  2. Right ventricular systolic function is mildly reduced. The right ventricular size is severely enlarged. Moderately increased right ventricular wall thickness. There is severely elevated pulmonary artery systolic pressure.  3. Right atrial size was severely dilated.  4. The mitral valve is normal in structure. Trivial mitral valve regurgitation.  5. Tricuspid valve regurgitation is severe.  6. The aortic valve is normal in structure. Aortic valve regurgitation is trivial. FINDINGS  Left Ventricle: Left ventricular ejection fraction, by estimation, is 65 to 70%. The left ventricle has normal function. The left ventricle has no regional wall motion abnormalities. The left ventricular internal cavity size was small. There is no left ventricular hypertrophy. Left ventricular diastolic parameters were normal. Right Ventricle: The right ventricular size is severely enlarged. Moderately increased right ventricular wall thickness. Right ventricular systolic function is mildly reduced. There is severely elevated pulmonary artery systolic pressure. Left Atrium: Left atrial size was normal in size. Right Atrium: Right atrial size was severely dilated. Pericardium: There is no evidence of pericardial effusion. Mitral Valve: The mitral valve is normal in structure. Trivial mitral valve regurgitation. Tricuspid Valve: The tricuspid valve is normal in structure. Tricuspid valve regurgitation is severe. Aortic Valve: The aortic valve is normal in structure. Aortic valve regurgitation is trivial. Aortic  valve mean gradient measures 2.0 mmHg. Aortic valve peak gradient measures 2.8 mmHg. Aortic valve area,  by VTI measures 2.37 cm. Pulmonic Valve: The pulmonic valve was normal in structure. Pulmonic valve regurgitation is mild. Aorta: The aortic root and ascending aorta are structurally normal, with no evidence of dilitation. IAS/Shunts: No atrial level shunt detected by color flow Doppler.  LEFT VENTRICLE PLAX 2D LVIDd:         2.60 cm     Diastology LVIDs:         2.00 cm     LV e' medial:    5.87 cm/s LV PW:         1.20 cm     LV E/e' medial:  6.0 LV IVS:        1.20 cm     LV e' lateral:   11.30 cm/s LVOT diam:     2.00 cm     LV E/e' lateral: 3.1 LV SV:         25 LV SV Index:   15 LVOT Area:     3.14 cm  LV Volumes (MOD) LV vol d, MOD A4C: 17.0 ml LV vol s, MOD A4C: 10.5 ml LV SV MOD A4C:     17.0 ml RIGHT VENTRICLE RV Basal diam:  4.30 cm RV S prime:     7.72 cm/s TAPSE (M-mode): 1.6 cm LEFT ATRIUM             Index        RIGHT ATRIUM           Index LA diam:        3.40 cm 1.99 cm/m   RA Area:     42.90 cm LA Vol (A2C):   67.6 ml 39.48 ml/m  RA Volume:   218.00 ml 127.31 ml/m LA Vol (A4C):   17.7 ml 10.34 ml/m LA Biplane Vol: 35.7 ml 20.85 ml/m  AORTIC VALVE AV Area (Vmax):    2.32 cm AV Area (Vmean):   2.02 cm AV Area (VTI):     2.37 cm AV Vmax:           83.20 cm/s AV Vmean:          59.100 cm/s AV VTI:            0.106 m AV Peak Grad:      2.8 mmHg AV Mean Grad:      2.0 mmHg LVOT Vmax:         61.40 cm/s LVOT Vmean:        38.000 cm/s LVOT VTI:          0.080 m LVOT/AV VTI ratio: 0.75  AORTA Ao Root diam: 3.15 cm MITRAL VALVE               TRICUSPID VALVE MV Area (PHT): 8.92 cm    TR Peak grad:   74.0 mmHg MV Decel Time: 85 msec     TR Vmax:        430.00 cm/s MV E velocity: 35.10 cm/s MV A velocity: 74.60 cm/s  SHUNTS MV E/A ratio:  0.47        Systemic VTI:  0.08 m                            Systemic Diam: 2.00 cm Serafina Royals MD Electronically signed by Serafina Royals MD Signature  Date/Time: 03/07/2022/12:58:25 PM    Final    CT CHEST W CONTRAST  Result Date: 03/07/2022 CLINICAL DATA:  Soft tissue mass. EXAM: CT CHEST  WITH CONTRAST TECHNIQUE: Multidetector CT imaging of the chest was performed during intravenous contrast administration. RADIATION DOSE REDUCTION: This exam was performed according to the departmental dose-optimization program which includes automated exposure control, adjustment of the mA and/or kV according to patient size and/or use of iterative reconstruction technique. CONTRAST:  79m OMNIPAQUE IOHEXOL 300 MG/ML  SOLN COMPARISON:  CTA of the chest of March 06, 2022. FINDINGS: Cardiovascular: As noted on the exam performed yesterday, there is again noted large saddle embolus extending from proximal right pulmonary artery and through left pulmonary artery into the left lower lobe branches. This is not significantly changed compared to yesterday. Atherosclerosis of thoracic aorta is noted without aneurysm or dissection. No pericardial effusion is noted. Mild cardiomegaly is noted. Mediastinum/Nodes: Thyroid gland is unremarkable. The esophagus appears normal. 2.3 cm right hilar lymph node is noted. Lungs/Pleura: No pneumothorax or pleural effusion is noted. Emphysematous disease and scarring is noted throughout both lungs. Stable 5 mm nodule is noted in right lower lobe best seen on image number 73 of series 3. Stable 3 mm nodule is noted in left upper lobe best seen on image number 39 of series 3. Stable 3 mm nodules also seen in left upper lobe best seen on image number 49 of series 3. Upper Abdomen: No acute abnormality. Musculoskeletal: No chest wall abnormality. No acute or significant osseous findings. IMPRESSION: There is stable large pulmonary embolus extending primarily through the left pulmonary artery and into the left lower lobe branches as described on prior exam. 2.3 cm right hilar lymph node is noted which may be inflammatory in etiology, but neoplasm cannot  be excluded. Follow-up CT scan with intravenous contrast is recommended in 2-3 months to evaluate stability. Extensive emphysematous disease and scarring is noted throughout both lungs as described on prior exam. Multiple pulmonary nodules are again noted as described on prior exam performed yesterday, with the largest measuring 5 mm in the right lower lobe. No follow-up needed if patient is low-risk (and has no known or suspected primary neoplasm). Non-contrast chest CT can be considered in 12 months if patient is high-risk. This recommendation follows the consensus statement: Guidelines for Management of Incidental Pulmonary Nodules Detected on CT Images: From the Fleischner Society 2017; Radiology 2017; 284:228-243. Aortic Atherosclerosis (ICD10-I70.0) and Emphysema (ICD10-J43.9). Electronically Signed   By: JMarijo ConceptionM.D.   On: 03/07/2022 11:14   [place]Venous Img Lower Bilateral (DVT)  Result Date: 03/07/2022 CLINICAL DATA:  Patient with pulmonary embolism.  Evaluate for DVT. EXAM: BILATERAL LOWER EXTREMITY VENOUS DOPPLER ULTRASOUND TECHNIQUE: Gray-scale sonography with graded compression, as well as color Doppler and duplex ultrasound were performed to evaluate the lower extremity deep venous systems from the level of the common femoral vein and including the common femoral, femoral, profunda femoral, popliteal and calf veins including the posterior tibial, peroneal and gastrocnemius veins when visible. The superficial great saphenous vein was also interrogated. Spectral Doppler was utilized to evaluate flow at rest and with distal augmentation maneuvers in the common femoral, femoral and popliteal veins. COMPARISON:  None Available. FINDINGS: RIGHT LOWER EXTREMITY Common Femoral Vein: No evidence of thrombus. Normal compressibility, respiratory phasicity and response to augmentation. Saphenofemoral Junction: No evidence of thrombus. Normal compressibility and flow on color Doppler imaging. Profunda  Femoral Vein: No evidence of thrombus. Normal compressibility and flow on color Doppler imaging. Femoral Vein: No evidence of thrombus. Normal compressibility, respiratory phasicity and response to augmentation. Popliteal Vein: No evidence of thrombus. Normal compressibility, respiratory phasicity and  response to augmentation. Calf Veins: No evidence of thrombus. Normal compressibility and flow on color Doppler imaging. Other Findings:  None. LEFT LOWER EXTREMITY Common Femoral Vein: No evidence of thrombus. Normal compressibility, respiratory phasicity and response to augmentation. Saphenofemoral Junction: No evidence of thrombus. Normal compressibility and flow on color Doppler imaging. Profunda Femoral Vein: No evidence of thrombus. Normal compressibility and flow on color Doppler imaging. Femoral Vein: No evidence of thrombus. Normal compressibility, respiratory phasicity and response to augmentation. Popliteal Vein: No evidence of thrombus. Normal compressibility, respiratory phasicity and response to augmentation. Calf Veins: No evidence of thrombus. Normal compressibility and flow on color Doppler imaging. Other Findings:  None. IMPRESSION: No evidence of deep venous thrombosis in either lower extremity. Electronically Signed   By: Markus Daft M.D.   On: 03/07/2022 07:29   CT Angio Chest PE W/Cm &/Or Wo Cm  Result Date: 03/06/2022 CLINICAL DATA:  CT Abdomen Pelvis W Contrast suspected, high prob Abdominal pain, acute, nonlocalized EXAM: CT ANGIOGRAPHY CHEST WITH CONTRAST TECHNIQUE: Multidetector CT imaging of the chest was performed using the standard protocol during bolus administration of intravenous contrast. Multiplanar CT image reconstructions and MIPs were obtained to evaluate the vascular anatomy. RADIATION DOSE REDUCTION: This exam was performed according to the departmental dose-optimization program which includes automated exposure control, adjustment of the mA and/or kV according to patient size  and/or use of iterative reconstruction technique. CONTRAST:  25m OMNIPAQUE IOHEXOL 350 MG/ML SOLN COMPARISON:  CT chest 03/05/2020 FINDINGS: Cardiovascular: Satisfactory opacification of the pulmonary arteries to the segmental level. Nonocclusive saddle embolus extending to the left lower lobe segmental and subsegmental levels as well as into the proximal right main pulmonary artery. Enlarged right heart with marked enlargement of the right atria. There is increased right to left ventricular ratio of at least 1.5. Retrograde reflux of intravenous contrast into a dilated inferior vena cava and hepatic veins. The main pulmonary artery is enlarged measuring up to 3.7 cm. No significant pericardial effusion. The thoracic aorta is normal in caliber. No atherosclerotic plaque of the thoracic aorta. No coronary artery calcifications. Mediastinum/Nodes: No enlarged mediastinal, hilar, or axillary lymph nodes. Thyroid gland, trachea, and esophagus demonstrate no significant findings. Lungs/Pleura: Severe centrilobular and paraseptal emphysematous changes. Scattered areas of reticulations, scarring, and bronchiectasis. No focal consolidation. 0.8 x 0.4 cm right middle lobe pulmonary nodule. Adjacent 0.7 x 0.6 cm ground-glass subpleural nodule (6:49). 0.5 x 0.5 cm left upper lobe pulmonary nodule (6:33). Left upper lobe pulmonary micronodule (6:27). Several other scattered pulmonary micronodules. No pulmonary mass. No pleural effusion. No pneumothorax. Upper Abdomen: Please see separately dictated CT abdomen pelvis 03/06/2022 Musculoskeletal: Subcutaneus soft tissue edema. No suspicious lytic or blastic osseous lesions. No acute displaced fracture. Multilevel degenerative changes of the spine. Chronic appearing minimal vertebral body height loss of the L4-L5 level. Review of the MIP images confirms the above findings. IMPRESSION: 1. Nonocclusive saddle pulmonary embolus extending to the left lower lobe segmental and  subsegmental levels as well as into the proximal right main pulmonary artery. Associated right heart strain. No pulmonary infarction. 2.  Emphysema (ICD10-J43.9) - severe. 3. Scattered pulmonary micronodules as well as a 0.8 x 0.4 cm right middle lobe pulmonary nodule and adjacent 0.7 x 0.6 cm ground-glass subpleural nodule. Initial follow-up with CT at 6 months is recommended to confirm persistence. If persistent, repeat CT is recommended every 2 years until 5 years of stability has been established. This recommendation follows the consensus statement: Guidelines for Management of Incidental Pulmonary Nodules  Detected on CT Images: From the Fleischner Society 2017; Radiology 2017; [phone]. These results were called by telephone at the time of interpretation on 03/06/2022 at 3:21 pm to provider [facility] , who verbally acknowledged these results. Electronically Signed   By: Iven Finn M.D.   On: 03/06/2022 15:32   CT Abdomen Pelvis W Contrast  Result Date: 03/06/2022 CLINICAL DATA:  Abdominal pain EXAM: CT ABDOMEN AND PELVIS WITH CONTRAST TECHNIQUE: Multidetector CT imaging of the abdomen and pelvis was performed using the standard protocol following bolus administration of intravenous contrast. RADIATION DOSE REDUCTION: This exam was performed according to the departmental dose-optimization program which includes automated exposure control, adjustment of the mA and/or kV according to patient size and/or use of iterative reconstruction technique. CONTRAST:  9m OMNIPAQUE IOHEXOL 350 MG/ML SOLN COMPARISON:  None Available. FINDINGS: Lower chest: Centrilobular and panlobular emphysema is seen in the lower lung fields. Prominence of interstitial markings in the lower lung fields suggest scarring. Heart is enlarged in size. Hepatobiliary: Liver measures 18.3 cm in length. There is fatty infiltration. There is no dilation of bile ducts. Small amount of fluid adjacent to the gallbladder may be part of  ascites. There is no significant wall thickening in gallbladder. Gallbladder is not distended. Pancreas: No focal abnormalities are seen. Spleen: Spleen is not seen. Surgical clips on the left hemidiaphragms suggest possible previous splenectomy. Adrenals/Urinary Tract: There is mild hyperplasia of left adrenal. There is no hydronephrosis. There are no renal or ureteral stones. Urinary bladder is not distended. Stomach/Bowel: Stomach is not distended. Small bowel loops are not dilated. Appendix is not seen. There is no significant wall thickening in colon. Vascular/Lymphatic: Unremarkable. Reproductive: Unremarkable. Other: Small ascites is present. There is no pneumoperitoneum. Left inguinal hernia containing fat is seen. There is subcutaneous edema suggesting anasarca. Musculoskeletal: Slight decrease in height of upper end plate of body of L4 vertebra may be residual from previous injury. Degenerative changes are noted in the lumbar spine, more so at L4-L5 and L5-S1 levels. There is minimal retrolisthesis at L4-L5 level. IMPRESSION: There is no evidence of intestinal obstruction or pneumoperitoneum. There is no hydronephrosis. Enlarged fatty liver. Small ascites. Severe COPD. Lumbar spondylosis. Electronically Signed   By: PElmer PickerM.D.   On: 03/06/2022 15:23   DG Chest 2 View  Result Date: 03/06/2022 CLINICAL DATA:  Shortness of breath. EXAM: CHEST - 2 VIEW COMPARISON:  CT scan of March 05, 2020. FINDINGS: Mild cardiomegaly is noted. Emphysematous disease is noted throughout both lungs as well as coarse reticular opacities concerning for scarring or fibrosis. No definite acute abnormality is noted. Bony thorax is unremarkable. IMPRESSION: Emphysematous changes noted bilaterally as well as coarse reticular opacities bilaterally concerning for fibrosis or scarring. No definite acute abnormality is noted. Aortic Atherosclerosis (ICD10-I70.0) and Emphysema (ICD10-J43.9). Electronically Signed   By:  JMarijo ConceptionM.D.   On: 03/06/2022 14:05      ASSESSMENT/PLAN  Bilateral pulmonary emboli. S/p mechanical thromboembolectomy. She is on anticoagulation- Eliquis. Base on her overall condition, limited ambulation, high risk of repeat clots, we discussed  most likely will keep her on it indefinitely. Wean fio2 as indicated ( goal 88 - 90 % range)   Pulmonary fibrosis, on appropriate coverage for ILD in the setting of connective tissue disease. However, the increase ground glass infiltrate. ? Underlying IPF flare. Agree with resuming  mycophenolate 500 mg p.o. twice daily and solumedrol 50 mg iv q 12 hrs, lasix as you doing. Protonix 40 mg  q day ( gerd is common in IPF). Seeking authorization for out patient esbriet. Also will send to [facility] transplant clinic for possible lung transplant post discharge    Pulmonary hypertension. 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 Hence on adcirca. 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 Will add letaris and if need be tyvaso   Copd, with bullae dz. Stable No acute bronchospasm. Duo neg q 6 hr while awake. Incentive spirometry   Scattered micro nodules bilateral, 0.8 RML nodule, 0.7 cm LUL ground class nodule, will continue surveillance on the out side,anca, ace pending.      Thank you for allowing me to participate in the care of this patient.   Patient/Family are satisfied with care plan and all questions have been answered.  This document was prepared using Dragon voice recognition software and may include unintentional dictation errors.     Wallene Huh, M.D.  Division of [place]

## 2022-03-20 DIAGNOSIS — I2602 Saddle embolus of pulmonary artery with acute cor pulmonale: Secondary | ICD-10-CM | POA: Diagnosis not present

## 2022-03-20 NOTE — Progress Notes (Signed)
Pulmonary Medicine          Date: 03/20/2022,   MRN# [id number] Tammy Barrett [birth year]       HISTORY OF PRESENT ILLNESS   Better morning, fio2 down to 5 liters, physical tx on going. Reviewed chart and labs. Ok to go to rehab. See me on 04/11/22    PAST MEDICAL HISTORY   Past Medical History:  Diagnosis Date   COPD (chronic obstructive pulmonary disease) ([name])    Discoid lupus    Eczema    Esophageal dysmotility    Genital herpes    GERD (gastroesophageal reflux disease)    HSV (herpes simplex virus) infection    Hypertension    Lumbago    Lumbar disc disease    Peripheral neuropathy    Pulmonary fibrosis ([place])    Pulmonary hypertension ([name])    Raynaud's disease    Sclerodactyly    Scleroderma ([place])    Spinal stenosis      SURGICAL HISTORY   Past Surgical History:  Procedure Laterality Date   bone removal from pinkie toe     COLONOSCOPY WITH PROPOFOL N/A 08/30/2021   Procedure: COLONOSCOPY WITH PROPOFOL;  Surgeon: [name], MD;  Location: [facility] ENDOSCOPY;  Service: Gastroenterology;  Laterality: N/A;   DILATION AND CURETTAGE OF UTERUS     PULMONARY THROMBECTOMY N/A 03/07/2022   Procedure: PULMONARY THROMBECTOMY;  Surgeon: Algernon Huxley, MD;  Location: [place] [facility];  Service: Cardiovascular;  Laterality: N/A;   RIGHT HEART CATH Right 09/17/2018   Procedure: RIGHT HEART CATH;  Surgeon: Teodoro Spray, MD;  Location: [place] [facility];  Service: Cardiovascular;  Laterality: Right;   SPLENECTOMY     TUBAL LIGATION       FAMILY HISTORY   Family History  Problem Relation Age of Onset   Uterine cancer Mother    Stroke Father    Breast cancer Neg Hx      SOCIAL HISTORY   Social History   Tobacco Use   Smoking status: Former    Packs/day: 0.50    Types: Cigarettes    Quit date: 01/08/2011    Years since quitting: 11.2   Smokeless tobacco: Never  Vaping Use   Vaping Use: Never used  Substance Use Topics   Alcohol use: No    Drug use: No     MEDICATIONS    Home Medication:    Current Medication:  Current Facility-Administered Medications:    amLODipine (NORVASC) tablet 10 mg, 10 mg, Oral, Daily, Dew, Jason S, MD, 10 mg at 03/20/22 0825   [COMPLETED] apixaban (ELIQUIS) tablet 10 mg, 10 mg, Oral, BID, 10 mg at 03/15/22 2043 **FOLLOWED BY** apixaban (ELIQUIS) tablet 5 mg, 5 mg, Oral, BID, [name], RPH, 5 mg at 03/20/22 0825   Chlorhexidine Gluconate Cloth 2 % PADS 6 each, 6 each, Topical, Q0600, Algernon Huxley, MD, 6 each at 03/18/22 8250   dextromethorphan-guaiFENesin ([place] DM) 30-600 MG per 12 hr tablet 1 tablet, 1 tablet, Oral, BID, Manuella Ghazi, Pratik D, DO, 1 tablet at 03/20/22 0825   feeding supplement (ENSURE ENLIVE / ENSURE PLUS) liquid 237 mL, 237 mL, Oral, TID BM, [name], [name], DO, 237 mL at 03/20/22 0826   fluticasone (FLONASE) 50 MCG/ACT nasal spray 1 spray, 1 spray, Each Nare, Daily PRN, Algernon Huxley, MD, 1 spray at 03/12/22 2153   furosemide (LASIX) tablet 40 mg, 40 mg, Oral, BID, Emeterio Reeve, DO, 40 mg at 03/20/22 [phone]  gabapentin (NEURONTIN) tablet 600 mg, 600 mg, Oral, TID, Dew, Erskine Squibb, MD, 600 mg at 03/20/22 0825   hydrocortisone cream 1 %, , Topical, QID PRN, Emeterio Reeve, DO   HYDROmorphone (DILAUDID) injection 1 mg, 1 mg, Intravenous, Once PRN, Lucky Cowboy, Erskine Squibb, MD   ipratropium-albuterol (DUONEB) 0.5-2.5 (3) MG/3ML nebulizer solution 3 mL, 3 mL, Nebulization, Q4H PRN, Emeterio Reeve, DO   multivitamin with minerals tablet 1 tablet, 1 tablet, Oral, Daily, Emeterio Reeve, DO, 1 tablet at 03/20/22 0825   mycophenolate (CELLCEPT) capsule 500 mg, 500 mg, Oral, BID, Dew, Erskine Squibb, MD, 500 mg at 03/20/22 5366   Oral care mouth rinse, 15 mL, Mouth Rinse, PRN, Sharion Settler, NP   pantoprazole (PROTONIX) EC tablet 40 mg, 40 mg, Oral, Daily, Dew, Erskine Squibb, MD, 40 mg at 03/20/22 0825   senna-docusate (Senokot-S) tablet 1 tablet, 1 tablet, Oral, QHS PRN, Algernon Huxley, MD,  1 tablet at 03/09/22 1347   sodium chloride (OCEAN) 0.65 % nasal spray 1 spray, 1 spray, Each Nare, PRN, Max Sane, MD   tadalafil (PAH) (ADCIRCA) tablet 20 mg, 20 mg, Oral, Daily, Dew, Erskine Squibb, MD, 20 mg at 03/20/22 4403    ALLERGIES   Hydroxychloroquine and Meclizine     REVIEW OF SYSTEMS    Review of Systems:  Gen:  Denies  fever, sweats, chills weigh loss  HEENT: Denies blurred vision, double vision, ear pain, eye pain, hearing loss, nose bleeds, sore throat Cardiac:  No dizziness, chest pain or heaviness, chest tightness,edema Resp:   Denies cough or sputum porduction, less shortness of breath,wheezing, hemoptysis,  Gi: Denies swallowing difficulty, stomach pain, nausea or vomiting, diarrhea, constipation, bowel incontinence Gu:  Denies bladder incontinence, burning urine Ext:   Denies Joint pain, stiffness +r swelling Skin: Denies  skin rash, easy bruising or bleeding or hives Endoc:  Denies polyuria, polydipsia , polyphagia or weight change Psych:   Denies depression, insomnia or hallucinations   Other:  All other systems negative   VS: BP 102/71 (BP Location: Right Arm)   Pulse 92   Temp 98.2 F (36.8 C) (Oral)   Resp 16   Ht _0  (1.651 m)   Wt 68.4 kg   SpO2 95%   BMI 25.11 kg/m      PHYSICAL EXAM    GENERAL:NAD, no fevers, chills, no weakness no fatigue HEAD: Normocephalic, atraumatic.  EYES: Pupils equal, round, reactive to light. Extraocular muscles intact. No scleral icterus.  MOUTH: Moist mucosal membrane. Dentition intact. No abscess noted.  EAR, NOSE, THROAT: Clear without exudates. No external lesions.  NECK: Supple. No thyromegaly. No nodules. No JVD.  PULMONARY: Diffuse crackles CARDIOVASCULAR: S1 and S2. Regular rate and rhythm. No murmurs, rubs, or gallops. No edema. Pedal pulses 2+ bilaterally.  GASTROINTESTINAL: Soft, nontender, nondistended. No masses. Positive bowel sounds. No hepatosplenomegaly.  MUSCULOSKELETAL: No swelling,  clubbing, + edema. Range of motion full in all extremities.  NEUROLOGIC: Cranial nerves II through XII are intact. No gross focal neurological deficits. Sensation intact. Reflexes intact.  SKIN: No ulceration, lesions, rashes, or cyanosis. Skin warm and dry. Turgor intact.  PSYCHIATRIC: Mood, affect within normal limits. The patient is awake, alert and oriented x 3. Insight, judgment intact.       IMAGING    DG Chest Port 1 View  Result Date: 03/10/2022 CLINICAL DATA:  Leg swelling EXAM: PORTABLE CHEST 1 VIEW COMPARISON:  Chest x-ray dated March 06, 2022; chest CT dated March 07, 2022 FINDINGS: Unchanged cardiomegaly. Severe  background emphysema with areas of nodularity, better evaluated on recent chest CT. No new focal consolidation. The visualized skeletal structures are unremarkable. IMPRESSION: Severe emphysema with no new focal consolidation. Electronically Signed   By: Yetta Glassman M.D.   On: 03/10/2022 10:25   PERIPHERAL VASCULAR CATHETERIZATION  Result Date: 03/07/2022 See surgical note for result.  ECHOCARDIOGRAM COMPLETE  Result Date: 03/07/2022    ECHOCARDIOGRAM REPORT   Patient Name:   KEVONNA NOLTE Date of Exam: 03/07/2022 Medical Rec #:  [id number]        Height:       65.0 in Accession #:    [id number]       Weight:       142.4 lb Date of Birth:  Sep 28, 1960        BSA:          1.712 m Patient Age:    18 years         BP:           118/100 mmHg Patient Gender: F                HR:           95 bpm. Exam Location:  [facility] Procedure: 2D Echo, Cardiac Doppler and Color Doppler Indications:     Pulmonary Embolus I26.09  History:         Patient has no prior history of Echocardiogram examinations.                  COPD and Pulmonary HTN; Risk Factors:Hypertension.  Sonographer:     Sherrie Sport Referring Phys:  [id number] AMY N COX Diagnosing Phys: Serafina Royals MD IMPRESSIONS  1. Left ventricular ejection fraction, by estimation, is 65 to 70%. The left ventricle has normal function.  The left ventricle has no regional wall motion abnormalities. Left ventricular diastolic parameters were normal.  2. Right ventricular systolic function is mildly reduced. The right ventricular size is severely enlarged. Moderately increased right ventricular wall thickness. There is severely elevated pulmonary artery systolic pressure.  3. Right atrial size was severely dilated.  4. The mitral valve is normal in structure. Trivial mitral valve regurgitation.  5. Tricuspid valve regurgitation is severe.  6. The aortic valve is normal in structure. Aortic valve regurgitation is trivial. FINDINGS  Left Ventricle: Left ventricular ejection fraction, by estimation, is 65 to 70%. The left ventricle has normal function. The left ventricle has no regional wall motion abnormalities. The left ventricular internal cavity size was small. There is no left ventricular hypertrophy. Left ventricular diastolic parameters were normal. Right Ventricle: The right ventricular size is severely enlarged. Moderately increased right ventricular wall thickness. Right ventricular systolic function is mildly reduced. There is severely elevated pulmonary artery systolic pressure. Left Atrium: Left atrial size was normal in size. Right Atrium: Right atrial size was severely dilated. Pericardium: There is no evidence of pericardial effusion. Mitral Valve: The mitral valve is normal in structure. Trivial mitral valve regurgitation. Tricuspid Valve: The tricuspid valve is normal in structure. Tricuspid valve regurgitation is severe. Aortic Valve: The aortic valve is normal in structure. Aortic valve regurgitation is trivial. Aortic valve mean gradient measures 2.0 mmHg. Aortic valve peak gradient measures 2.8 mmHg. Aortic valve area, by VTI measures 2.37 cm. Pulmonic Valve: The pulmonic valve was normal in structure. Pulmonic valve regurgitation is mild. Aorta: The aortic root and ascending aorta are structurally normal, with no evidence of  dilitation. IAS/Shunts: No atrial level shunt detected by  color flow Doppler.  LEFT VENTRICLE PLAX 2D LVIDd:         2.60 cm     Diastology LVIDs:         2.00 cm     LV e' medial:    5.87 cm/s LV PW:         1.20 cm     LV E/e' medial:  6.0 LV IVS:        1.20 cm     LV e' lateral:   11.30 cm/s LVOT diam:     2.00 cm     LV E/e' lateral: 3.1 LV SV:         25 LV SV Index:   15 LVOT Area:     3.14 cm  LV Volumes (MOD) LV vol d, MOD A4C: 17.0 ml LV vol s, MOD A4C: 10.5 ml LV SV MOD A4C:     17.0 ml RIGHT VENTRICLE RV Basal diam:  4.30 cm RV S prime:     7.72 cm/s TAPSE (M-mode): 1.6 cm LEFT ATRIUM             Index        RIGHT ATRIUM           Index LA diam:        3.40 cm 1.99 cm/m   RA Area:     42.90 cm LA Vol (A2C):   67.6 ml 39.48 ml/m  RA Volume:   218.00 ml 127.31 ml/m LA Vol (A4C):   17.7 ml 10.34 ml/m LA Biplane Vol: 35.7 ml 20.85 ml/m  AORTIC VALVE AV Area (Vmax):    2.32 cm AV Area (Vmean):   2.02 cm AV Area (VTI):     2.37 cm AV Vmax:           83.20 cm/s AV Vmean:          59.100 cm/s AV VTI:            0.106 m AV Peak Grad:      2.8 mmHg AV Mean Grad:      2.0 mmHg LVOT Vmax:         61.40 cm/s LVOT Vmean:        38.000 cm/s LVOT VTI:          0.080 m LVOT/AV VTI ratio: 0.75  AORTA Ao Root diam: 3.15 cm MITRAL VALVE               TRICUSPID VALVE MV Area (PHT): 8.92 cm    TR Peak grad:   74.0 mmHg MV Decel Time: 85 msec     TR Vmax:        430.00 cm/s MV E velocity: 35.10 cm/s MV A velocity: 74.60 cm/s  SHUNTS MV E/A ratio:  0.47        Systemic VTI:  0.08 m                            Systemic Diam: 2.00 cm Serafina Royals MD Electronically signed by Serafina Royals MD Signature Date/Time: 03/07/2022/12:58:25 PM    Final    CT CHEST W CONTRAST  Result Date: 03/07/2022 CLINICAL DATA:  Soft tissue mass. EXAM: CT CHEST WITH CONTRAST TECHNIQUE: Multidetector CT imaging of the chest was performed during intravenous contrast administration. RADIATION DOSE REDUCTION: This exam was performed  according to the departmental dose-optimization program which includes automated exposure control, adjustment of the mA and/or kV according to  patient size and/or use of iterative reconstruction technique. CONTRAST:  75m OMNIPAQUE IOHEXOL 300 MG/ML  SOLN COMPARISON:  CTA of the chest of March 06, 2022. FINDINGS: Cardiovascular: As noted on the exam performed yesterday, there is again noted large saddle embolus extending from proximal right pulmonary artery and through left pulmonary artery into the left lower lobe branches. This is not significantly changed compared to yesterday. Atherosclerosis of thoracic aorta is noted without aneurysm or dissection. No pericardial effusion is noted. Mild cardiomegaly is noted. Mediastinum/Nodes: Thyroid gland is unremarkable. The esophagus appears normal. 2.3 cm right hilar lymph node is noted. Lungs/Pleura: No pneumothorax or pleural effusion is noted. Emphysematous disease and scarring is noted throughout both lungs. Stable 5 mm nodule is noted in right lower lobe best seen on image number 73 of series 3. Stable 3 mm nodule is noted in left upper lobe best seen on image number 39 of series 3. Stable 3 mm nodules also seen in left upper lobe best seen on image number 49 of series 3. Upper Abdomen: No acute abnormality. Musculoskeletal: No chest wall abnormality. No acute or significant osseous findings. IMPRESSION: There is stable large pulmonary embolus extending primarily through the left pulmonary artery and into the left lower lobe branches as described on prior exam. 2.3 cm right hilar lymph node is noted which may be inflammatory in etiology, but neoplasm cannot be excluded. Follow-up CT scan with intravenous contrast is recommended in 2-3 months to evaluate stability. Extensive emphysematous disease and scarring is noted throughout both lungs as described on prior exam. Multiple pulmonary nodules are again noted as described on prior exam performed yesterday, with the  largest measuring 5 mm in the right lower lobe. No follow-up needed if patient is low-risk (and has no known or suspected primary neoplasm). Non-contrast chest CT can be considered in 12 months if patient is high-risk. This recommendation follows the consensus statement: Guidelines for Management of Incidental Pulmonary Nodules Detected on CT Images: From the Fleischner Society 2017; Radiology 2017; 284:228-243. Aortic Atherosclerosis (ICD10-I70.0) and Emphysema (ICD10-J43.9). Electronically Signed   By: JMarijo ConceptionM.D.   On: 03/07/2022 11:14   UK[place]Venous Img Lower Bilateral (DVT)  Result Date: 03/07/2022 CLINICAL DATA:  Patient with pulmonary embolism.  Evaluate for DVT. EXAM: BILATERAL LOWER EXTREMITY VENOUS DOPPLER ULTRASOUND TECHNIQUE: Gray-scale sonography with graded compression, as well as color Doppler and duplex ultrasound were performed to evaluate the lower extremity deep venous systems from the level of the common femoral vein and including the common femoral, femoral, profunda femoral, popliteal and calf veins including the posterior tibial, peroneal and gastrocnemius veins when visible. The superficial great saphenous vein was also interrogated. Spectral Doppler was utilized to evaluate flow at rest and with distal augmentation maneuvers in the common femoral, femoral and popliteal veins. COMPARISON:  None Available. FINDINGS: RIGHT LOWER EXTREMITY Common Femoral Vein: No evidence of thrombus. Normal compressibility, respiratory phasicity and response to augmentation. Saphenofemoral Junction: No evidence of thrombus. Normal compressibility and flow on color Doppler imaging. Profunda Femoral Vein: No evidence of thrombus. Normal compressibility and flow on color Doppler imaging. Femoral Vein: No evidence of thrombus. Normal compressibility, respiratory phasicity and response to augmentation. Popliteal Vein: No evidence of thrombus. Normal compressibility, respiratory phasicity and response to  augmentation. Calf Veins: No evidence of thrombus. Normal compressibility and flow on color Doppler imaging. Other Findings:  None. LEFT LOWER EXTREMITY Common Femoral Vein: No evidence of thrombus. Normal compressibility, respiratory phasicity and response to augmentation. Saphenofemoral Junction:  No evidence of thrombus. Normal compressibility and flow on color Doppler imaging. Profunda Femoral Vein: No evidence of thrombus. Normal compressibility and flow on color Doppler imaging. Femoral Vein: No evidence of thrombus. Normal compressibility, respiratory phasicity and response to augmentation. Popliteal Vein: No evidence of thrombus. Normal compressibility, respiratory phasicity and response to augmentation. Calf Veins: No evidence of thrombus. Normal compressibility and flow on color Doppler imaging. Other Findings:  None. IMPRESSION: No evidence of deep venous thrombosis in either lower extremity. Electronically Signed   By: Markus Daft M.D.   On: 03/07/2022 07:29   CT Angio Chest PE W/Cm &/Or Wo Cm  Result Date: 03/06/2022 CLINICAL DATA:  CT Abdomen Pelvis W Contrast suspected, high prob Abdominal pain, acute, nonlocalized EXAM: CT ANGIOGRAPHY CHEST WITH CONTRAST TECHNIQUE: Multidetector CT imaging of the chest was performed using the standard protocol during bolus administration of intravenous contrast. Multiplanar CT image reconstructions and MIPs were obtained to evaluate the vascular anatomy. RADIATION DOSE REDUCTION: This exam was performed according to the departmental dose-optimization program which includes automated exposure control, adjustment of the mA and/or kV according to patient size and/or use of iterative reconstruction technique. CONTRAST:  10m OMNIPAQUE IOHEXOL 350 MG/ML SOLN COMPARISON:  CT chest 03/05/2020 FINDINGS: Cardiovascular: Satisfactory opacification of the pulmonary arteries to the segmental level. Nonocclusive saddle embolus extending to the left lower lobe segmental and  subsegmental levels as well as into the proximal right main pulmonary artery. Enlarged right heart with marked enlargement of the right atria. There is increased right to left ventricular ratio of at least 1.5. Retrograde reflux of intravenous contrast into a dilated inferior vena cava and hepatic veins. The main pulmonary artery is enlarged measuring up to 3.7 cm. No significant pericardial effusion. The thoracic aorta is normal in caliber. No atherosclerotic plaque of the thoracic aorta. No coronary artery calcifications. Mediastinum/Nodes: No enlarged mediastinal, hilar, or axillary lymph nodes. Thyroid gland, trachea, and esophagus demonstrate no significant findings. Lungs/Pleura: Severe centrilobular and paraseptal emphysematous changes. Scattered areas of reticulations, scarring, and bronchiectasis. No focal consolidation. 0.8 x 0.4 cm right middle lobe pulmonary nodule. Adjacent 0.7 x 0.6 cm ground-glass subpleural nodule (6:49). 0.5 x 0.5 cm left upper lobe pulmonary nodule (6:33). Left upper lobe pulmonary micronodule (6:27). Several other scattered pulmonary micronodules. No pulmonary mass. No pleural effusion. No pneumothorax. Upper Abdomen: Please see separately dictated CT abdomen pelvis 03/06/2022 Musculoskeletal: Subcutaneus soft tissue edema. No suspicious lytic or blastic osseous lesions. No acute displaced fracture. Multilevel degenerative changes of the spine. Chronic appearing minimal vertebral body height loss of the L4-L5 level. Review of the MIP images confirms the above findings. IMPRESSION: 1. Nonocclusive saddle pulmonary embolus extending to the left lower lobe segmental and subsegmental levels as well as into the proximal right main pulmonary artery. Associated right heart strain. No pulmonary infarction. 2.  Emphysema (ICD10-J43.9) - severe. 3. Scattered pulmonary micronodules as well as a 0.8 x 0.4 cm right middle lobe pulmonary nodule and adjacent 0.7 x 0.6 cm ground-glass subpleural  nodule. Initial follow-up with CT at 6 months is recommended to confirm persistence. If persistent, repeat CT is recommended every 2 years until 5 years of stability has been established. This recommendation follows the consensus statement: Guidelines for Management of Incidental Pulmonary Nodules Detected on CT Images: From the Fleischner Society 2017; Radiology 2017; 284:228-243. These results were called by telephone at the time of interpretation on 03/06/2022 at 3:21 pm to [facility], who verbally acknowledged these results. Electronically Signed  By: Iven Finn M.D.   On: 03/06/2022 15:32   CT Abdomen Pelvis W Contrast  Result Date: 03/06/2022 CLINICAL DATA:  Abdominal pain EXAM: CT ABDOMEN AND PELVIS WITH CONTRAST TECHNIQUE: Multidetector CT imaging of the abdomen and pelvis was performed using the standard protocol following bolus administration of intravenous contrast. RADIATION DOSE REDUCTION: This exam was performed according to the departmental dose-optimization program which includes automated exposure control, adjustment of the mA and/or kV according to patient size and/or use of iterative reconstruction technique. CONTRAST:  75m OMNIPAQUE IOHEXOL 350 MG/ML SOLN COMPARISON:  None Available. FINDINGS: Lower chest: Centrilobular and panlobular emphysema is seen in the lower lung fields. Prominence of interstitial markings in the lower lung fields suggest scarring. Heart is enlarged in size. Hepatobiliary: Liver measures 18.3 cm in length. There is fatty infiltration. There is no dilation of bile ducts. Small amount of fluid adjacent to the gallbladder may be part of ascites. There is no significant wall thickening in gallbladder. Gallbladder is not distended. Pancreas: No focal abnormalities are seen. Spleen: Spleen is not seen. Surgical clips on the left hemidiaphragms suggest possible previous splenectomy. Adrenals/Urinary Tract: There is mild hyperplasia of left adrenal. There  is no hydronephrosis. There are no renal or ureteral stones. Urinary bladder is not distended. Stomach/Bowel: Stomach is not distended. Small bowel loops are not dilated. Appendix is not seen. There is no significant wall thickening in colon. Vascular/Lymphatic: Unremarkable. Reproductive: Unremarkable. Other: Small ascites is present. There is no pneumoperitoneum. Left inguinal hernia containing fat is seen. There is subcutaneous edema suggesting anasarca. Musculoskeletal: Slight decrease in height of upper end plate of body of L4 vertebra may be residual from previous injury. Degenerative changes are noted in the lumbar spine, more so at L4-L5 and L5-S1 levels. There is minimal retrolisthesis at L4-L5 level. IMPRESSION: There is no evidence of intestinal obstruction or pneumoperitoneum. There is no hydronephrosis. Enlarged fatty liver. Small ascites. Severe COPD. Lumbar spondylosis. Electronically Signed   By: PElmer PickerM.D.   On: 03/06/2022 15:23   DG Chest 2 View  Result Date: 03/06/2022 CLINICAL DATA:  Shortness of breath. EXAM: CHEST - 2 VIEW COMPARISON:  CT scan of March 05, 2020. FINDINGS: Mild cardiomegaly is noted. Emphysematous disease is noted throughout both lungs as well as coarse reticular opacities concerning for scarring or fibrosis. No definite acute abnormality is noted. Bony thorax is unremarkable. IMPRESSION: Emphysematous changes noted bilaterally as well as coarse reticular opacities bilaterally concerning for fibrosis or scarring. No definite acute abnormality is noted. Aortic Atherosclerosis (ICD10-I70.0) and Emphysema (ICD10-J43.9). Electronically Signed   By: JMarijo ConceptionM.D.   On: 03/06/2022 14:05      ASSESSMENT/PLAN   Bilateral pulmonary emboli. S/p mechanical thromboembolectomy. She is on anticoagulation- Eliquis. Base on her overall condition, limited ambulation, high risk of repeat clots, we discussed  most likely will keep her on it indefinitely. Wean fio2 as  indicated ( goal 88 - 90 % range)   Pulmonary fibrosis, on appropriate coverage for ILD in the setting of connective tissue disease. However, the increase ground glass infiltrate. ? Underlying IPF flare. Agree with resuming  mycophenolate 500 mg p.o. 12 hrs, prednisone 40 mg q day , lasix as you doing. 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 Consider adding  esbriet. [facility]    Pulmonary hypertension. 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 Hence on adcirca. Looked like there is right heart failure. Hence added letaris and if need be tyvaso   Copd, with bullae dz. Stable No acute bronchospasm. Duo neg q 6 hr while awake. Incentive spirometry   Scattered micro nodules bilateral, 0.8 RML nodule, 0.7 cm LUL ground class nodule, will continue surveillance on the out side,     Thank you for allowing me to participate in the care of this patient.   Patient/Family are satisfied with care plan and all questions have been answered.  This document was prepared using Dragon voice recognition software and may include unintentional dictation errors.     Wallene Huh, M.D.  Division of [facility]

## 2022-03-20 NOTE — Care Management Important Message (Signed)
Important Message  Patient Details  Name: Tammy Barrett MRN: [id number] Date of Birth: 1960/10/06   Medicare Important Message Given:  Yes     Dannette Barbara 03/20/2022, 12:54 PM

## 2022-03-20 NOTE — Progress Notes (Signed)
Occupational Therapy Treatment Patient Details Name: Tammy Barrett MRN: [id number] DOB: [birth year] Today's Date: 03/20/2022   History of present illness Pt is a 61 y.o. female presenting to hospital 03/06/22 with c/o decreased appetite, some difficulty swallowing, and leg swelling.  Pt admitted with PE (large saddle pulmonary embolism with R heart strain), and acute on chronic respiratory failure with hypoxia.  S/p thrombectomy of saddle PE 03/07/22.  PMH includes htn, lupus, scleroderma, pulmonary fibrosis, pulmonary htn, mixed connective tissue disease, esophageal dysmotility, COPD, HSV, peripheral neuropathy, and Raynaud's disease.  O2 use as needed.   OT comments  Pt seen for OT tx this date. Pt in recliner, friend present, and agreeable to session. Pt ambulated ~18' to sink, completed grooming tasks standing at sink with PRN VC for PLB with SpO2 desat to 80-81% on 6L and HR 109. Amb back to recliner with supervision 18' +RW. Required 2.44mn with PLB on 6L to recover from 80-81% to 90% and HR 101 and able to maintain 90% on 5L at end of session. Pt demonstrating improved activity tolerance and requiring decreased O2 overall for session this date. Continues to benefit from skilled OT services to maximize return to PLOF. Continue to recommend SNF.    Recommendations for follow up therapy are one component of a multi-disciplinary discharge planning process, led by the attending physician.  Recommendations may be updated based on patient status, additional functional criteria and insurance authorization.    Follow Up Recommendations  Skilled nursing-short term rehab (<3 hours/day)    Assistance Recommended at Discharge Intermittent Supervision/Assistance  Patient can return home with the following  A little help with walking and/or transfers;A little help with bathing/dressing/bathroom;Assistance with cooking/housework;Help with stairs or ramp for entrance;Assist for transportation   Equipment  Recommendations  Tub/shower seat    Recommendations for Other Services      Precautions / Restrictions Precautions Precautions: Fall Precaution Comments: Mod Fall Risk Restrictions Weight Bearing Restrictions: No       Mobility Bed Mobility               General bed mobility comments: seated in recliner beginning/end of treatment session    Transfers Overall transfer level: Needs assistance Equipment used: Rolling walker (2 wheels) Transfers: Sit to/from Stand Sit to Stand: Supervision                 Balance Overall balance assessment: Needs assistance Sitting-balance support: No upper extremity supported, Feet supported Sitting balance-Leahy Scale: Good     Standing balance support: During functional activity, Single extremity supported, No upper extremity supported Standing balance-Leahy Scale: Good Standing balance comment: pt stood at the sink with intermittent UE support on counter during grooming tasks                           ADL either performed or assessed with clinical judgement   ADL Overall ADL's : Needs assistance/impaired     Grooming: Wash/dry face;Oral care;Standing;Set up;Supervision/safety Grooming Details (indicate cue type and reason): Pt completed grooming tasks standing at the sink in the room within RW frame                                    Extremity/Trunk Assessment              Vision       Perception     Praxis  Cognition Arousal/Alertness: Awake/alert Behavior During Therapy: WFL for tasks assessed/performed Overall Cognitive Status: Within Functional Limits for tasks assessed                                          Exercises Other Exercises Other Exercises: Pt ambulated ~18' to sink, completed grooming tasks with PRN VC for PLB with SpO2 desat to 80-81% on 6L and HR 109. Amb back to recliner with supervision 18' +RW. Required 2.29mn with PLB on 6L to recover  from 80-81% to 90% and HR 101 and able to maintain 90% on 5L at end of session.    Shoulder Instructions       General Comments      Pertinent Vitals/ Pain       Pain Assessment Pain Assessment: No/denies pain  Home Living                                          Prior Functioning/Environment              Frequency  Min 2X/week        Progress Toward Goals  OT Goals(current goals can now be found in the care plan section)  Progress towards OT goals: Progressing toward goals  Acute Rehab OT Goals Patient Stated Goal: to get stronger OT Goal Formulation: With patient Time For Goal Achievement: 03/24/22 Potential to Achieve Goals: Good  Plan Discharge plan remains appropriate;Frequency remains appropriate    Co-evaluation                 AM-PAC OT "6 Clicks" Daily Activity     Outcome Measure   Help from another person eating meals?: None Help from another person taking care of personal grooming?: A Little Help from another person toileting, which includes using toliet, bedpan, or urinal?: A Little Help from another person bathing (including washing, rinsing, drying)?: A Little Help from another person to put on and taking off regular upper body clothing?: A Little Help from another person to put on and taking off regular lower body clothing?: A Little 6 Click Score: 19    End of Session Equipment Utilized During Treatment: Rolling walker (2 wheels);Oxygen  OT Visit Diagnosis: Unsteadiness on feet (R26.81);Muscle weakness (generalized) (M62.81)   Activity Tolerance Patient tolerated treatment well   Patient Left in chair;with call bell/phone within reach;with family/visitor present   Nurse Communication          Time: 10634-9494OT Time Calculation (min): 23 min  Charges: OT General Charges $OT Visit: 1 Visit OT Treatments $Self Care/Home Management : 23-37 mins  JArdeth Perfect, MPH, MS, OTR/L ascom [phone]/24/23,  4:27 PM

## 2022-03-20 NOTE — Progress Notes (Signed)
Progress Note   Patient: Tammy Barrett:[id number] DOB: [birth year] DOA: 03/06/2022     14 DOS: the patient was seen and examined on 03/20/2022   Brief hospital course: [name] is a 61 year old female with hypertension, neuropathy, GERD, pulmonary hypertension, scleroderma, COPD, hepatosteatosis, mixed connective tissue disease, long-term use of immunosuppressant medication, pulmonary fibrosis, who presents to the emergency department 03/06/2022 for chief concerns of poor appetite. 08/10: Initial vitals heart rate of 103, blood pressure 123/103, SpO2 of 72% on room air with improvement to 95% on 5 L nasal cannula. BNP was elevated at 1738.4, high sensitive troponin was 40, creatinine 1.24.  No concerns on CT abdomen/pelvis.  CTA chest (+)Nonocclusive saddle pulmonary embolus extending to the left lower lobe segmental and subsegmental levels as well as into the proximal right main pulmonary artery. Associated right heart strain. No pulmonary infarction.  Severe emphysema, multiple pulmonary nodules to follow-up outpatient. Heparin GTT per pharmacy was initiated.  Patient was admitted to hospitalist service stepdown status with vascular consult for saddle PE. EDP had already consulted vascular who recommended heparin bolus and patient to be taken to the OR for thrombectomy in the a.m.  08/11: thrombectomy planned. I was contacted by radiologist Dr. Denna Haggard this morning and due to abnormal appearance of presumed clot and no evidence for DVT as well as significantly dilated RA, radiology is recommending CT chest with contrast to evaluate for potential mediastinal mass as opposed to pulmonary embolus, prior to taking her for thrombectomy.  Confirmed PE.  Also noted 2.3 cm right hilar lymph node, possible inflammatory, recommending follow-up CT with IV contrast 2 to 3 months. Echocardiogram --> LVEF 65 to 70%, normal function, normal diastolic parameters.  Right ventricular systolic function mildly  reduced, moderately increased RV wall thickness, severely elevated pulmonary artery systolic pressure, right atrial size severely dilated.  Underwent thrombolysis/thrombectomy.  08/12: Remains on heparin drip.  HFNC - requiring 10 L/min this morning but improved to 5 L/min later in the day. Pt reports SOB is overall better. Transferred to progressive status.  08/13: still requiring 6 /min O2. D/c heparin gtt 48 hours after thrombectomy and transition Eliquis. RN voiced concern low UOP, continue strict I&O and encouraged po intake  08/14: requiring 8 L/min overnight and maintaining on 5 L/min O2 this morning, no output documented from yesterday. Rales/coarse breath sounds and JVD --> Lasix 60 mg IV x1 and restart home lasix. CXR obtained to futher eval possible underlying pneumonia - images reviewed, stable cardiomegaly and severe background emphysema no new consolidations.  08/15: paged pulmonary Dr [name] 10:53 AM for routine consult. Pt appears fluid overloaded based on LE edema, rales, JVD but given pulm HTN and underlying lung disease will also seek pulmonary recs if any other interventions might be helpful. Continue lasix for now and strict I&O, daily weights.  08/16: still needing 9L/min, confirmed pulmonary to see her today.  8/17: On 8 L nasal cannula oxygen.  Pulmonary following 8/18: on high flow with FiO2 of 35% and tolerating well with sat in mid-high 90's 8/19: Continues on high flow with FiO2 45%.  Continues to have cough with whitish sputum production. 8/20: Able to wean back to 8 L of oxygen.  Uses 3 L at baseline.  PT/OT are recommending SNF 8/21: Clinically seems stable, at 7 L of oxygen. 8/22: Remains stable and able to wean to 5 L. 8/23: Remains stable, apparently was put back to 7 L overnight, able to wean her back to 5 L again.  Patient do show some transient desaturation with ambulation, discussed with pulmonary and they are okay with transient desaturation and keeping the  saturation around 88%. Had a bed offer-pending insurance authorization   Assessment and Plan: * Pulmonary emboli ([name]) S/p mechanical thrombectomy by vascular surgery.  Initially treated with heparin now transition to Eliquis. Echocardiogram --> LVEF 65 to 70%, normal function, normal diastolic parameters.  Right ventricular systolic function mildly reduced, moderately increased RV wall thickness, severely elevated pulmonary artery systolic pressure, right atrial size severely dilated. DVT negative.  Acute on chronic respiratory failure with hypoxia ([name]) Secondary to pulmonary emboli Patient at baseline wears 3 L oxygen, currently on 5 L supplementation as needed for emphysema  Continue oxygen supplementation to maintain SpO2 greater than 92% Try weaning as tolerated Current continue Lasix 40 mg p.o. twice daily Pulmonology following    Pulmonary fibrosis ([place]) Pulmonary hypertension Pulmonology following Continue home tadalafil 20 mg daily.  Pulmonary hypertension ([name]) Seems stable. Pulmonology following, he has started patient on Ambrisentan and tadalafil  Continue Lasix 40 mg po bid, strict I&O  Mixed connective tissue disease ([name]) status post Plaquenil, prednisone, Imuran Currently on mycophenolate 500 mg p.o. twice daily.  Solu-Medrol 80 mg IV daily  Lymphadenopathy, mediastinal 2.3 cm right hilar lymph node, possible inflammatory, noted on CT chest 03/07/22  Outpatient follow-up CT with IV contrast 2 to 3 months  (HFpEF) heart failure with preserved ejection fraction ([place]) 03/16/22 evidenced by rales, LE edema, JVD 08/11 Echocardiogram --> LVEF 65 to 70%, normal function, normal diastolic parameters.  Right ventricular systolic function mildly reduced, moderately increased RV wall thickness, severely elevated pulmonary artery systolic pressure, right atrial size severely dilated. Continue Lasix 40 mg p.o. twice daily CXR no significant pulmonary edema Pulmonology  following    Protein-calorie malnutrition, severe Complicates overall prognosis. -Dietitian consult  Emphysema, spiromety in office is c/w mild obstruction ([place]) Extensive lung disease, see above for pulmonary fibrosis On IV Solu-Medrol and nebulizer along with inhalers No home inhalers Not compliant w/ home O2 Pulmonology following DuoNeb prn    Subjective: Patient was anxiously waiting for insurance authorization with the hoping to move today.  Physical Exam: Vitals:   03/20/22 0024 03/20/22 0408 03/20/22 0822 03/20/22 1239  BP: 112/66 102/70 102/71 93/67  Pulse: 100 91 92 94  Resp: _0 Temp: 98.2 F (36.8 C) 98.2 F (36.8 C) 98.2 F (36.8 C) 98.4 F (36.9 C)  TempSrc: Oral Oral Oral   SpO2: 93% 93% 95% 95%  Weight:      Height:       General.  Well developed lady, in no acute distress. Pulmonary.  Few scattered dry crackles bilaterally, normal respiratory effort. CV.  Regular rate and rhythm, no JVD, rub or murmur. Abdomen.  Soft, nontender, nondistended, BS positive. CNS.  Alert and oriented .  No focal neurologic deficit. Extremities.  No edema, no cyanosis, pulses intact and symmetrical. Psychiatry.  Judgment and insight appears normal.   Data Reviewed: Prior data reviewed  Family Communication: Discussed with patient  Disposition: Status is: Inpatient Remains inpatient appropriate because:-Pending insurance authorization for SNF   Planned Discharge Destination: Skilled nursing facility  DVT prophylaxis.  Eliquis Time spent: 40 minutes  This record has been created using Systems analyst. Errors have been sought and corrected,but may not always be located. Such creation errors do not reflect on the standard of care.  Author: Lorella Nimrod, MD 03/20/2022 3:33 PM  For on call review [email or web address].

## 2022-03-20 NOTE — TOC Progression Note (Addendum)
Transition of Care [facility]) - Progression Note    Patient Details  Name: Tammy Barrett MRN: [id number] Date of Birth: 09/19/1960  Transition of Care [facility]) CM/SW [place], LCSW Phone Number: 03/20/2022, 8:30 AM  Clinical Narrative: Josem Kaufmann still pending.    10:45 am: Concentrator has been delivered to the SNF. Auth still pending.  2:27 pm: Auth still pending.  4:06 pm: Auth still pending. Navi called and said she may require peer-to-peer review. Patient, sister, MD, and SNF are aware.  Expected Discharge Plan: [place] Barriers to Discharge: Continued Medical Work up  Expected Discharge Plan and Services Expected Discharge Plan: [place] Choice: [place] arrangements for the past 2 months: Single Family Home                                       Social Determinants of Health (SDOH) Interventions    Readmission Risk Interventions     No data to display

## 2022-03-21 DIAGNOSIS — I2602 Saddle embolus of pulmonary artery with acute cor pulmonale: Secondary | ICD-10-CM | POA: Diagnosis not present

## 2022-03-21 NOTE — Progress Notes (Signed)
Progress Note   Patient: Tammy Barrett PIR:[id number] DOB: 1961/06/23 DOA: 03/06/2022     15 DOS: the patient was seen and examined on 03/21/2022   Brief hospital course: [name] is a 61 year old female with hypertension, neuropathy, GERD, pulmonary hypertension, scleroderma, COPD, hepatosteatosis, mixed connective tissue disease, long-term use of immunosuppressant medication, pulmonary fibrosis, who presents to the emergency department 03/06/2022 for chief concerns of poor appetite. [name] 08/10: Initial vitals heart rate of 103, blood pressure 123/103, SpO2 of 72% on room air with improvement to 95% on 5 L nasal cannula. BNP was elevated at 1738.4, high sensitive troponin was 40, creatinine 1.24.  No concerns on CT abdomen/pelvis.  CTA chest (+)Nonocclusive saddle pulmonary embolus extending to the left lower lobe segmental and subsegmental levels as well as into the proximal right main pulmonary artery. Associated right heart strain. No pulmonary infarction.  Severe emphysema, multiple pulmonary nodules to follow-up outpatient. Heparin GTT per pharmacy was initiated.  Patient was admitted to hospitalist service stepdown status with vascular consult for saddle PE. EDP had already consulted vascular who recommended heparin bolus and patient to be taken to the OR for thrombectomy in the a.m.  . 08/11: thrombectomy planned. I was contacted by radiologist Dr. Denna Haggard this morning and due to abnormal appearance of presumed clot and no evidence for DVT as well as significantly dilated RA, radiology is recommending CT chest with contrast to evaluate for potential mediastinal mass as opposed to pulmonary embolus, prior to taking her for thrombectomy.  Confirmed PE.  Also noted 2.3 cm right hilar lymph node, possible inflammatory, recommending follow-up CT with IV contrast 2 to 3 months. Echocardiogram --> LVEF 65 to 70%, normal function, normal diastolic parameters.  Right ventricular systolic function  mildly reduced, moderately increased RV wall thickness, severely elevated pulmonary artery systolic pressure, right atrial size severely dilated.  Underwent thrombolysis/thrombectomy.  [name] 08/12: Remains on heparin drip.  HFNC - requiring 10 L/min this morning but improved to 5 L/min later in the day. Pt reports SOB is overall better. Transferred to progressive status.  [name] 08/13: still requiring 6 /min O2. D/c heparin gtt 48 hours after thrombectomy and transition Eliquis. RN voiced concern low UOP, continue strict I&O and encouraged po intake  . 08/14: requiring 8 L/min overnight and maintaining on 5 L/min O2 this morning, no output documented from yesterday. Rales/coarse breath sounds and JVD --> Lasix 60 mg IV x1 and restart home lasix. CXR obtained to futher eval possible underlying pneumonia - images reviewed, stable cardiomegaly and severe background emphysema no new consolidations.  [name] 08/15: paged pulmonary Dr [name] 10:53 AM for routine consult. Pt appears fluid overloaded based on LE edema, rales, JVD but given pulm HTN and underlying lung disease will also seek pulmonary recs if any other interventions might be helpful. Continue lasix for now and strict I&O, daily weights.  [name] 08/16: still needing 9L/min, confirmed pulmonary to see her today.  . 8/17: On 8 L nasal cannula oxygen.  Pulmonary following . 8/18: on high flow with FiO2 of 35% and tolerating well with sat in mid-high 90's . 8/19: Continues on high flow with FiO2 45%.  Continues to have cough with whitish sputum production. . 8/20: Able to wean back to 8 L of oxygen.  Uses 3 L at baseline.  PT/OT are recommending SNF . 8/21: Clinically seems stable, at 7 L of oxygen. . 8/22: Remains stable and able to wean to 5 L. . 8/23: Remains stable, apparently was put  back to 7 L overnight, able to wean her back to 5 L again.  Patient do show some transient desaturation with ambulation, discussed with pulmonary and they are okay with transient  desaturation and keeping the saturation around 88%. . Had a bed offer-pending insurance authorization . 8/25: Patient remained stable.  Still pending insurance authorization.   Assessment and Plan: * Pulmonary emboli ([name])  S/p mechanical thrombectomy by vascular surgery.  Initially treated with heparin now transition to Eliquis.  Echocardiogram --> LVEF 65 to 70%, normal function, normal diastolic parameters.  Right ventricular systolic function mildly reduced, moderately increased RV wall thickness, severely elevated pulmonary artery systolic pressure, right atrial size severely dilated.  DVT negative.  Acute on chronic respiratory failure with hypoxia ([name]) Secondary to pulmonary emboli Patient at baseline wears 3 L oxygen, currently on 5 L supplementation as needed for emphysema   Continue oxygen supplementation to maintain SpO2 greater than 92%  Try weaning as tolerated  Current continue Lasix 40 mg p.o. twice daily  Pulmonology following    Pulmonary fibrosis ([place]) Pulmonary hypertension  Pulmonology following  Continue home tadalafil 20 mg daily.  Pulmonary hypertension ([name]) Seems stable.  Pulmonology following, he has started patient on Ambrisentan and tadalafil   Continue Lasix 40 mg po bid, strict I&O  Mixed connective tissue disease ([name])  status post Plaquenil, prednisone, Imuran  Currently on mycophenolate 500 mg p.o. twice daily.  Solu-Medrol 80 mg IV daily  Lymphadenopathy, mediastinal 2.3 cm right hilar lymph node, possible inflammatory, noted on CT chest 03/07/22   Outpatient follow-up CT with IV contrast 2 to 3 months  (HFpEF) heart failure with preserved ejection fraction ([place]) 03/16/22 evidenced by rales, LE edema, JVD 08/11 Echocardiogram --> LVEF 65 to 70%, normal function, normal diastolic parameters.  Right ventricular systolic function mildly reduced, moderately increased RV wall thickness, severely elevated pulmonary artery systolic  pressure, right atrial size severely dilated.  Continue Lasix 40 mg p.o. twice daily  CXR no significant pulmonary edema  Pulmonology following    Protein-calorie malnutrition, severe Complicates overall prognosis. -Dietitian consult  Emphysema, spiromety in office is c/w mild obstruction ([place]) Extensive lung disease, see above for pulmonary fibrosis On IV Solu-Medrol and nebulizer along with inhalers No home inhalers Not compliant w/ home O2  Pulmonology following  DuoNeb prn         Subjective: Patient was seen and examined today.  No new complaint.  She was very frustrated regarding her insurance taking that long for rehab.  Physical Exam: Vitals:   03/21/22 0411 03/21/22 0748 03/21/22 1147 03/21/22 1532  BP: 103/70 108/76 93/62 99/64  Pulse: 95 93 91 100  Resp: _0 Temp: 99 F (37.2 C) 98.4 F (36.9 C) 98.3 F (36.8 C) 98.1 F (36.7 C)  TempSrc:    Oral  SpO2: 90% 91% 96% 91%  Weight:      Height:       General.  Well-developed lady, in no acute distress. Pulmonary.  Few scattered dry crackles bilaterally, normal respiratory effort. CV.  Regular rate and rhythm, no JVD, rub or murmur. Abdomen.  Soft, nontender, nondistended, BS positive. CNS.  Alert and oriented .  No focal neurologic deficit. Extremities.  No edema, no cyanosis, pulses intact and symmetrical. Psychiatry.  Judgment and insight appears normal.  Data Reviewed: Prior data reviewed  Family Communication: Discussed with patient  Disposition: Status is: Inpatient Remains inpatient appropriate because: Pending insurance authorization for rehab   Planned Discharge Destination:  Skilled nursing facility  DVT prophylaxis.  Eliquis Time spent: 40 minutes  This record has been created using Systems analyst. Errors have been sought and corrected,but may not always be located. Such creation errors do not reflect on the standard of care.  Author: Lorella Nimrod,  MD 03/21/2022 4:21 PM  For on call review [email or web address].

## 2022-03-21 NOTE — Progress Notes (Signed)
Physical Therapy Treatment Patient Details Name: Tammy Barrett MRN: [id number] DOB: October 29, 1960 Today's Date: 03/21/2022   History of Present Illness Pt is a 61 y.o. female presenting to hospital 03/06/22 with c/o decreased appetite, some difficulty swallowing, and leg swelling.  Pt admitted with PE (large saddle pulmonary embolism with R heart strain), and acute on chronic respiratory failure with hypoxia.  S/p thrombectomy of saddle PE 03/07/22.  PMH includes htn, lupus, scleroderma, pulmonary fibrosis, pulmonary htn, mixed connective tissue disease, esophageal dysmotility, COPD, HSV, peripheral neuropathy, and Raynaud's disease.  O2 use as needed.    PT Comments    Pt resting in recliner upon PT arrival; agreeable to PT session.  During session pt able to ambulate 50 feet in room (with 2 short standing rest breaks) with RW use.  Pt's O2 sats 88-89% on 5 L O2 via nasal cannula beginning/end of session at rest but O2 sats decreased to 79-80% post ambulation on 6 L O2 via nasal cannula (pt requiring a few minutes of sitting rest and pursed lip breathing to improve breathing/SOB and O2 sats)--nurse notified.  Will continue to focus on strengthening, endurance, and progressive functional mobility during hospitalization.    Recommendations for follow up therapy are one component of a multi-disciplinary discharge planning process, led by the attending physician.  Recommendations may be updated based on patient status, additional functional criteria and insurance authorization.  Follow Up Recommendations  Skilled nursing-short term rehab (<3 hours/day) Can patient physically be transported by private vehicle: Yes   Assistance Recommended at Discharge Frequent or constant Supervision/Assistance  Patient can return home with the following A little help with walking and/or transfers;A little help with bathing/dressing/bathroom;Assistance with cooking/housework;Assist for transportation;Help with stairs or  ramp for entrance   Equipment Recommendations  Rolling walker (2 wheels);BSC/3in1    Recommendations for Other Services       Precautions / Restrictions Precautions Precautions: Fall Restrictions Weight Bearing Restrictions: No     Mobility  Bed Mobility               General bed mobility comments: Deferred (pt in recliner beginning/end of session)    Transfers Overall transfer level: Needs assistance Equipment used: Rolling walker (2 wheels) Transfers: Sit to/from Stand Sit to Stand: Supervision           General transfer comment: steady safe transfers from recliner using RW    Ambulation/Gait Ambulation/Gait assistance: Supervision Gait Distance (Feet): 50 Feet Assistive device: Rolling walker (2 wheels)   Gait velocity: decreased     General Gait Details: step through gait pattern; slow guarded gait but steady; 2 short standing rest breaks d/t SOB   Stairs             Wheelchair Mobility    Modified Rankin (Stroke Patients Only)       Balance Overall balance assessment: Needs assistance Sitting-balance support: No upper extremity supported, Feet supported Sitting balance-Leahy Scale: Good Sitting balance - Comments: steady sitting reaching outside BOS   Standing balance support: Bilateral upper extremity supported, During functional activity Standing balance-Leahy Scale: Good Standing balance comment: steady ambulating with RW use                            Cognition Arousal/Alertness: Awake/alert Behavior During Therapy: WFL for tasks assessed/performed Overall Cognitive Status: Within Functional Limits for tasks assessed  Exercises      General Comments  Nursing cleared pt for participation in physical therapy.  Pt agreeable to PT session.      Pertinent Vitals/Pain Pain Assessment Pain Assessment: No/denies pain HR 101-111 bpm during sessions  activities.    Home Living                          Prior Function            PT Goals (current goals can now be found in the care plan section) Acute Rehab PT Goals Patient Stated Goal: to go home PT Goal Formulation: With patient Time For Goal Achievement: 03/24/22 Potential to Achieve Goals: Good Progress towards PT goals: Progressing toward goals    Frequency    Min 2X/week      PT Plan Current plan remains appropriate    Co-evaluation              AM-PAC PT "6 Clicks" Mobility   Outcome Measure  Help needed turning from your back to your side while in a flat bed without using bedrails?: None Help needed moving from lying on your back to sitting on the side of a flat bed without using bedrails?: A Little Help needed moving to and from a bed to a chair (including a wheelchair)?: A Little Help needed standing up from a chair using your arms (e.g., wheelchair or bedside chair)?: A Little Help needed to walk in hospital room?: A Little Help needed climbing 3-5 steps with a railing? : A Little 6 Click Score: 19    End of Session Equipment Utilized During Treatment: Gait belt;Oxygen Activity Tolerance: Patient tolerated treatment well Patient left: in chair;with call bell/phone within reach Nurse Communication: Mobility status;Precautions;Other (comment) (pt's O2 sats during session) PT Visit Diagnosis: Unsteadiness on feet (R26.81);Muscle weakness (generalized) (M62.81);Other abnormalities of gait and mobility (R26.89)     Time: 1643-1700 PT Time Calculation (min) (ACUTE ONLY): 17 min  Charges:  $Therapeutic Activity: 8-22 mins                    Leitha Bleak, PT 03/21/22, 5:16 PM

## 2022-03-21 NOTE — TOC Progression Note (Addendum)
Transition of Care [facility]) - Progression Note    Patient Details  Name: Tammy Barrett MRN: [id number] Date of Birth: 1961-04-06  Transition of Care [facility]) CM/SW [place], LCSW Phone Number: 03/21/2022, 8:51 AM  Clinical Narrative:  Josem Kaufmann still pending.   11:59 am: [facility]. Authorization is under review by medical director.  3:26 pm: Auth still pending.  4:23 pm: Auth still pending. Sent message through [facility] portal to provide weekend TOC contact information.  Expected Discharge Plan: [place] Barriers to Discharge: Continued Medical Work up  Expected Discharge Plan and Services Expected Discharge Plan: [place] Choice: [place] arrangements for the past 2 months: Single Family Home                                       Social Determinants of Health (SDOH) Interventions    Readmission Risk Interventions     No data to display

## 2022-03-22 DIAGNOSIS — I2602 Saddle embolus of pulmonary artery with acute cor pulmonale: Secondary | ICD-10-CM | POA: Diagnosis not present

## 2022-03-22 NOTE — Plan of Care (Signed)

## 2022-03-22 NOTE — Progress Notes (Signed)
Progress Note   Tammy Barrett: Tammy Barrett ION:[id number] DOB: 14-Dec-1960 DOA: 03/06/2022     16 DOS: the Tammy Barrett was seen and examined on 03/22/2022   Brief hospital course: Tammy Barrett is a 61 year old female with hypertension, neuropathy, GERD, pulmonary hypertension, scleroderma, COPD, hepatosteatosis, mixed connective tissue disease, long-term use of immunosuppressant medication, pulmonary fibrosis, who presents to the emergency department 03/06/2022 for chief concerns of poor appetite. [name] 08/10: Initial vitals heart rate of 103, blood pressure 123/103, SpO2 of 72% on room air with improvement to 95% on 5 L nasal cannula. BNP was elevated at 1738.4, high sensitive troponin was 40, creatinine 1.24.  No concerns on CT abdomen/pelvis.  CTA chest (+)Nonocclusive saddle pulmonary embolus extending to the left lower lobe segmental and subsegmental levels as well as into the proximal right main pulmonary artery. Associated right heart strain. No pulmonary infarction.  Severe emphysema, multiple pulmonary nodules to follow-up outpatient. Heparin GTT per pharmacy was initiated.  Tammy Barrett was admitted to hospitalist service stepdown status with vascular consult for saddle PE. EDP had already consulted vascular who recommended heparin bolus and Tammy Barrett to be taken to the OR for thrombectomy in the a.m.  . 08/11: thrombectomy planned. I was contacted by radiologist Dr. Denna Haggard this morning and due to abnormal appearance of presumed clot and no evidence for DVT as well as significantly dilated RA, radiology is recommending CT chest with contrast to evaluate for potential mediastinal mass as opposed to pulmonary embolus, prior to taking her for thrombectomy.  Confirmed PE.  Also noted 2.3 cm right hilar lymph node, possible inflammatory, recommending follow-up CT with IV contrast 2 to 3 months. Echocardiogram --> LVEF 65 to 70%, normal function, normal diastolic parameters.  Right ventricular systolic function  mildly reduced, moderately increased RV wall thickness, severely elevated pulmonary artery systolic pressure, right atrial size severely dilated.  Underwent thrombolysis/thrombectomy.  [name] 08/12: Remains on heparin drip.  HFNC - requiring 10 L/min this morning but improved to 5 L/min later in the day. Pt reports SOB is overall better. Transferred to progressive status.  [name] 08/13: still requiring 6 /min O2. D/c heparin gtt 48 hours after thrombectomy and transition Eliquis. RN voiced concern low UOP, continue strict I&O and encouraged po intake  . 08/14: requiring 8 L/min overnight and maintaining on 5 L/min O2 this morning, no output documented from yesterday. Rales/coarse breath sounds and JVD --> Lasix 60 mg IV x1 and restart home lasix. CXR obtained to futher eval possible underlying pneumonia - images reviewed, stable cardiomegaly and severe background emphysema no new consolidations.  [name] 08/15: paged pulmonary Dr [name] 10:53 AM for routine consult. Pt appears fluid overloaded based on LE edema, rales, JVD but given pulm HTN and underlying lung disease will also seek pulmonary recs if any other interventions might be helpful. Continue lasix for now and strict I&O, daily weights.  [name] 08/16: still needing 9L/min, confirmed pulmonary to see her today.  . 8/17: On 8 L nasal cannula oxygen.  Pulmonary following . 8/18: on high flow with FiO2 of 35% and tolerating well with sat in mid-high 90's . 8/19: Continues on high flow with FiO2 45%.  Continues to have cough with whitish sputum production. . 8/20: Able to wean back to 8 L of oxygen.  Uses 3 L at baseline.  PT/OT are recommending SNF . 8/21: Clinically seems stable, at 7 L of oxygen. . 8/22: Remains stable and able to wean to 5 L. . 8/23: Remains stable, apparently was put  back to 7 L overnight, able to wean her back to 5 L again.  Patient do show some transient desaturation with ambulation, discussed with pulmonary and they are okay with transient  desaturation and keeping the saturation around 88%. . Had a bed offer-pending insurance authorization . 8/25: Tammy Barrett remained stable.  Still pending insurance authorization. . 8/26: Received insurance authorization but SNF will not be able to take her before Monday.  Remained on 5 L.   Assessment and Plan: * Pulmonary emboli ([name])  S/p mechanical thrombectomy by vascular surgery.  Initially treated with heparin now transition to Eliquis.  Echocardiogram --> LVEF 65 to 70%, normal function, normal diastolic parameters.  Right ventricular systolic function mildly reduced, moderately increased RV wall thickness, severely elevated pulmonary artery systolic pressure, right atrial size severely dilated.  DVT negative.  Acute on chronic respiratory failure with hypoxia ([name]) Secondary to pulmonary emboli Tammy Barrett at baseline wears 3 L oxygen, currently on 5 L supplementation as needed for emphysema   Continue oxygen supplementation to maintain SpO2 greater than 92%  Try weaning as tolerated  Current continue Lasix 40 mg p.o. twice daily  Pulmonology following    Pulmonary fibrosis ([place]) Pulmonary hypertension  Pulmonology following  Continue home tadalafil 20 mg daily.  Pulmonary hypertension ([name]) Seems stable.  Pulmonology following, he has started Tammy Barrett on Ambrisentan and tadalafil   Continue Lasix 40 mg po bid, strict I&O  Mixed connective tissue disease ([name])  status post Plaquenil, prednisone, Imuran  Currently on mycophenolate 500 mg p.o. twice daily.  Solu-Medrol 80 mg IV daily  Lymphadenopathy, mediastinal 2.3 cm right hilar lymph node, possible inflammatory, noted on CT chest 03/07/22   Outpatient follow-up CT with IV contrast 2 to 3 months  (HFpEF) heart failure with preserved ejection fraction ([place]) 03/16/22 evidenced by rales, LE edema, JVD 08/11 Echocardiogram --> LVEF 65 to 70%, normal function, normal diastolic parameters.  Right ventricular systolic  function mildly reduced, moderately increased RV wall thickness, severely elevated pulmonary artery systolic pressure, right atrial size severely dilated.  Continue Lasix 40 mg p.o. twice daily  CXR no significant pulmonary edema  Pulmonology following    Protein-calorie malnutrition, severe Complicates overall prognosis. -Dietitian consult  Emphysema, spiromety in office is c/w mild obstruction ([place]) Extensive lung disease, see above for pulmonary fibrosis On IV Solu-Medrol and nebulizer along with inhalers No home inhalers Not compliant w/ home O2  Pulmonology following  DuoNeb prn       Subjective: Tammy Barrett was seen and examined today.  No new complaint.  Physical Exam: Vitals:   03/21/22 2312 03/22/22 0333 03/22/22 0807 03/22/22 1108  BP: 105/72 104/69 103/71 107/73  Pulse: (!) 103 99 91 98  Resp: _0 Temp: 98.4 F (36.9 C) 98.6 F (37 C) 98 F (36.7 C) 98.3 F (36.8 C)  TempSrc:    Oral  SpO2: (!) 89% 95% 95% 93%  Weight:      Height:       General.     In no acute distress. Pulmonary.  Few scattered dry crackles bilaterally, normal respiratory effort. CV.  Regular rate and rhythm, no JVD, rub or murmur. Abdomen.  Soft, nontender, nondistended, BS positive. CNS.  Alert and oriented .  No focal neurologic deficit. Extremities.  No edema, no cyanosis, pulses intact and symmetrical. Psychiatry.  Judgment and insight appears normal.  Data Reviewed: Prior data reviewed  Family Communication: Discussed with Tammy Barrett  Disposition: Status is: Inpatient Remains inpatient appropriate because: Awaiting  SNF placement.  Most likely will go on Monday.   Planned Discharge Destination: Skilled nursing facility  DVT prophylaxis.  Eliquis Time spent: 35 minutes  This record has been created using Systems analyst. Errors have been sought and corrected,but may not always be located. Such creation errors do not reflect on the standard of  care.  Author: Lorella Nimrod, MD 03/22/2022 3:32 PM  For on call review [email or web address].

## 2022-03-22 NOTE — TOC Progression Note (Signed)
Transition of Care [facility]) - Progression Note    Patient Details  Name: Tammy Barrett MRN: [id number] Date of Birth: 1961-05-11  Transition of Care [facility]) CM/SW Contact  Izola Price, RN Phone Number: 03/22/2022, 12:26 PM  Clinical Narrative:  8/26: [place] authorization approved. Notified provider. Contacted Gena Rudd at [employer], but they cannot take till Monday now. Auth good till 03/25/22. Updated provider. Simmie Davies RN CM      Expected Discharge Plan: Skilled Nursing Facility Barriers to Discharge: Continued Medical Work up  Expected Discharge Plan and Services Expected Discharge Plan: [place] Choice: [place] arrangements for the past 2 months: Single Family Home                                       Social Determinants of Health (SDOH) Interventions    Readmission Risk Interventions     No data to display

## 2022-03-23 DIAGNOSIS — I2602 Saddle embolus of pulmonary artery with acute cor pulmonale: Secondary | ICD-10-CM | POA: Diagnosis not present

## 2022-03-23 LAB — CBC
HCT: 37 % (ref 36.0–46.0)
Hemoglobin: 12.2 g/dL (ref 12.0–15.0)
MCH: 31 pg (ref 26.0–34.0)
MCHC: 33 g/dL (ref 30.0–36.0)
MCV: 94.1 fL (ref 80.0–100.0)
Platelets: 263 10*3/uL (ref 150–400)
RBC: 3.93 MIL/uL (ref 3.87–5.11)
RDW: 18.4 % — ABNORMAL HIGH (ref 11.5–15.5)
WBC: 6.3 10*3/uL (ref 4.0–10.5)
nRBC: 0 % (ref 0.0–0.2)

## 2022-03-23 NOTE — Progress Notes (Signed)
Progress Note   Patient: Tammy Barrett SNK:[id number] DOB: 1961/01/26 DOA: 03/06/2022     17 DOS: the patient was seen and examined on 03/23/2022   Brief hospital course: [name] is a 61 year old female with hypertension, neuropathy, GERD, pulmonary hypertension, scleroderma, COPD, hepatosteatosis, mixed connective tissue disease, long-term use of immunosuppressant medication, pulmonary fibrosis, who presents to the emergency department 03/06/2022 for chief concerns of poor appetite. [name] 08/10: Initial vitals heart rate of 103, blood pressure 123/103, SpO2 of 72% on room air with improvement to 95% on 5 L nasal cannula. BNP was elevated at 1738.4, high sensitive troponin was 40, creatinine 1.24.  No concerns on CT abdomen/pelvis.  CTA chest (+)Nonocclusive saddle pulmonary embolus extending to the left lower lobe segmental and subsegmental levels as well as into the proximal right main pulmonary artery. Associated right heart strain. No pulmonary infarction.  Severe emphysema, multiple pulmonary nodules to follow-up outpatient. Heparin GTT per pharmacy was initiated.  Patient was admitted to hospitalist service stepdown status with vascular consult for saddle PE. EDP had already consulted vascular who recommended heparin bolus and patient to be taken to the OR for thrombectomy in the a.m.  . 08/11: thrombectomy planned. I was contacted by radiologist Dr. Denna Haggard this morning and due to abnormal appearance of presumed clot and no evidence for DVT as well as significantly dilated RA, radiology is recommending CT chest with contrast to evaluate for potential mediastinal mass as opposed to pulmonary embolus, prior to taking her for thrombectomy.  Confirmed PE.  Also noted 2.3 cm right hilar lymph node, possible inflammatory, recommending follow-up CT with IV contrast 2 to 3 months. Echocardiogram --> LVEF 65 to 70%, normal function, normal diastolic parameters.  Right ventricular systolic function  mildly reduced, moderately increased RV wall thickness, severely elevated pulmonary artery systolic pressure, right atrial size severely dilated.  Underwent thrombolysis/thrombectomy.  [name] 08/12: Remains on heparin drip.  HFNC - requiring 10 L/min this morning but improved to 5 L/min later in the day. Pt reports SOB is overall better. Transferred to progressive status.  [name] 08/13: still requiring 6 /min O2. D/c heparin gtt 48 hours after thrombectomy and transition Eliquis. RN voiced concern low UOP, continue strict I&O and encouraged po intake  . 08/14: requiring 8 L/min overnight and maintaining on 5 L/min O2 this morning, no output documented from yesterday. Rales/coarse breath sounds and JVD --> Lasix 60 mg IV x1 and restart home lasix. CXR obtained to futher eval possible underlying pneumonia - images reviewed, stable cardiomegaly and severe background emphysema no new consolidations.  [name] 08/15: paged pulmonary Dr [name] 10:53 AM for routine consult. Pt appears fluid overloaded based on LE edema, rales, JVD but given pulm HTN and underlying lung disease will also seek pulmonary recs if any other interventions might be helpful. Continue lasix for now and strict I&O, daily weights.  [name] 08/16: still needing 9L/min, confirmed pulmonary to see her today.  . 8/17: On 8 L nasal cannula oxygen.  Pulmonary following . 8/18: on high flow with FiO2 of 35% and tolerating well with sat in mid-high 90's . 8/19: Continues on high flow with FiO2 45%.  Continues to have cough with whitish sputum production. . 8/20: Able to wean back to 8 L of oxygen.  Uses 3 L at baseline.  PT/OT are recommending SNF . 8/21: Clinically seems stable, at 7 L of oxygen. . 8/22: Remains stable and able to wean to 5 L. . 8/23: Remains stable, apparently was put  back to 7 L overnight, able to wean her back to 5 L again.  Patient do show some transient desaturation with ambulation, discussed with pulmonary and they are okay with transient  desaturation and keeping the saturation around 88%. . Had a bed offer-pending insurance authorization . 8/25: Patient remained stable.  Still pending insurance authorization. . 8/26: Received insurance authorization but SNF will not be able to take her before Monday.  Remained on 5 L. . 8/27: Stable and hopefully leave tomorrow as facility was unable to take new patient over the weekend.   Assessment and Plan: * Pulmonary emboli ([name])  S/p mechanical thrombectomy by vascular surgery.  Initially treated with heparin now transition to Eliquis.  Echocardiogram --> LVEF 65 to 70%, normal function, normal diastolic parameters.  Right ventricular systolic function mildly reduced, moderately increased RV wall thickness, severely elevated pulmonary artery systolic pressure, right atrial size severely dilated.  DVT negative.  Acute on chronic respiratory failure with hypoxia ([name]) Secondary to pulmonary emboli Patient at baseline wears 3 L oxygen, currently on 5 L supplementation as needed for emphysema   Continue oxygen supplementation to maintain SpO2 greater than 92%  Try weaning as tolerated  Current continue Lasix 40 mg p.o. twice daily  Pulmonology following    Pulmonary fibrosis ([place]) Pulmonary hypertension  Pulmonology following  Continue home tadalafil 20 mg daily.  Pulmonary hypertension ([name]) Seems stable.  Pulmonology following, he has started patient on Ambrisentan and tadalafil   Continue Lasix 40 mg po bid, strict I&O  Mixed connective tissue disease ([name])  status post Plaquenil, prednisone, Imuran  Currently on mycophenolate 500 mg p.o. twice daily.  Solu-Medrol 80 mg IV daily  Lymphadenopathy, mediastinal 2.3 cm right hilar lymph node, possible inflammatory, noted on CT chest 03/07/22   Outpatient follow-up CT with IV contrast 2 to 3 months  (HFpEF) heart failure with preserved ejection fraction ([place]) 03/16/22 evidenced by rales, LE edema, JVD 08/11  Echocardiogram --> LVEF 65 to 70%, normal function, normal diastolic parameters.  Right ventricular systolic function mildly reduced, moderately increased RV wall thickness, severely elevated pulmonary artery systolic pressure, right atrial size severely dilated.  Continue Lasix 40 mg p.o. twice daily  CXR no significant pulmonary edema  Pulmonology following    Protein-calorie malnutrition, severe Complicates overall prognosis. -Dietitian consult  Emphysema, spiromety in office is c/w mild obstruction ([place]) Extensive lung disease, see above for pulmonary fibrosis On IV Solu-Medrol and nebulizer along with inhalers No home inhalers Not compliant w/ home O2  Pulmonology following  DuoNeb prn        Subjective: Patient was seen and examined today.  No new complaints or concerns.  Physical Exam: Vitals:   03/23/22 0014 03/23/22 0442 03/23/22 0722 03/23/22 1117  BP: 106/63 114/77 111/74 121/79  Pulse: (!) 105 97 100 100  Resp: _0 Temp: 99.3 F (37.4 C) 98.9 F (37.2 C) 98.7 F (37.1 C) 98.6 F (37 C)  TempSrc: Oral Oral Oral   SpO2:  (!) 85% 92% 96%  Weight:      Height:       General.  Well-developed lady, in no acute distress. Pulmonary.  Few scattered dry crackles bilaterally, normal respiratory effort. CV.  Regular rate and rhythm, no JVD, rub or murmur. Abdomen.  Soft, nontender, nondistended, BS positive. CNS.  Alert and oriented .  No focal neurologic deficit. Extremities.  No edema, no cyanosis, pulses intact and symmetrical. Psychiatry.  Judgment and insight appears normal.  Data  Reviewed: Prior data reviewed  Family Communication:   Disposition: Status is: Inpatient Remains inpatient appropriate because: Waiting for SNF , will go tomorrow as rehab do not take patient over the weekend   Planned Discharge Destination: Skilled nursing facility  DVT prophylaxis.  Eliquis Time spent: 35 minutes  This record has been created using Actor. Errors have been sought and corrected,but may not always be located. Such creation errors do not reflect on the standard of care.  Author: Lorella Nimrod, MD 03/23/2022 1:26 PM  For on call review [email or web address].

## 2022-03-24 DIAGNOSIS — I2602 Saddle embolus of pulmonary artery with acute cor pulmonale: Secondary | ICD-10-CM | POA: Diagnosis not present

## 2022-03-24 DIAGNOSIS — I5032 Chronic diastolic (congestive) heart failure: Secondary | ICD-10-CM | POA: Diagnosis not present

## 2022-03-24 DIAGNOSIS — I10 Essential (primary) hypertension: Secondary | ICD-10-CM | POA: Diagnosis not present

## 2022-03-24 DIAGNOSIS — J439 Emphysema, unspecified: Secondary | ICD-10-CM | POA: Diagnosis not present

## 2022-03-24 LAB — HYPERSENSITIVITY PNEUMONITIS
A. Pullulans Abs: NEGATIVE
A.Fumigatus #1 Abs: NEGATIVE
Micropolyspora faeni, IgG: NEGATIVE
Pigeon Serum Abs: NEGATIVE
Thermoact. Saccharii: NEGATIVE
Thermoactinomyces vulgaris, IgG: NEGATIVE

## 2022-03-24 MED ORDER — FUROSEMIDE 40 MG PO TABS: 40.0000 mg | ORAL_TABLET | Freq: Two times a day (BID) | ORAL | Status: DC

## 2022-03-24 MED ORDER — ENSURE ENLIVE PO LIQD: 237.0000 mL | Freq: Three times a day (TID) | ORAL | 12 refills | Status: DC

## 2022-03-24 MED ORDER — APIXABAN 5 MG PO TABS: 5.0000 mg | ORAL_TABLET | Freq: Two times a day (BID) | ORAL | Status: DC

## 2022-03-24 MED ORDER — SALINE SPRAY 0.65 % NA SOLN: 1.0000 | NASAL | 0 refills | Status: DC | PRN

## 2022-03-24 MED ORDER — HYDROCORTISONE 1 % EX CREA: TOPICAL_CREAM | Freq: Four times a day (QID) | CUTANEOUS | 0 refills | Status: DC | PRN

## 2022-03-24 MED ORDER — SENNOSIDES-DOCUSATE SODIUM 8.6-50 MG PO TABS: 1.0000 | ORAL_TABLET | Freq: Every evening | ORAL | Status: DC | PRN

## 2022-03-24 MED ORDER — DM-GUAIFENESIN ER 30-600 MG PO TB12: 1.0000 | ORAL_TABLET | Freq: Two times a day (BID) | ORAL | Status: DC | PRN

## 2022-03-24 MED ORDER — IPRATROPIUM-ALBUTEROL 0.5-2.5 (3) MG/3ML IN SOLN: 3.0000 mL | RESPIRATORY_TRACT | Status: DC | PRN

## 2022-03-24 NOTE — Discharge Summary (Signed)
Physician Discharge Summary   Patient: Tammy Barrett MRN: [id number] DOB: 14-Oct-1960  Admit date:     03/06/2022  Discharge date: 03/24/22  Discharge Physician: Lorella Nimrod   PCP: Dion Body, MD   Recommendations at discharge:  Please obtain CBC and BMP in 1 week Follow-up with pulmonology within a week Continue using 5 L of oxygen Keep sedation above 88% Follow-up with primary care provider  Discharge Diagnoses: Principal Problem:   Pulmonary emboli [facility]) Active Problems:   Acute on chronic respiratory failure with hypoxia ([place])   Pulmonary fibrosis ([place])   Pulmonary hypertension ([place])   Mixed connective tissue disease ([place])   Lymphadenopathy, mediastinal   (HFpEF) heart failure with preserved ejection fraction ([place])   Protein-calorie malnutrition, severe   GERD (gastroesophageal reflux disease)   Emphysema, spiromety in office is c/w mild obstruction ([place])   Pressure injury of skin  Resolved Problems:   Essential hypertension  Hospital Course: [name] is a 61 year old female with hypertension, neuropathy, GERD, pulmonary hypertension, scleroderma, COPD, hepatosteatosis, mixed connective tissue disease, long-term use of immunosuppressant medication, pulmonary fibrosis, who presents to the emergency department 03/06/2022 for chief concerns of poor appetite. 08/10: Initial vitals heart rate of 103, blood pressure 123/103, SpO2 of 72% on room air with improvement to 95% on 5 L nasal cannula. BNP was elevated at 1738.4, high sensitive troponin was 40, creatinine 1.24.  No concerns on CT abdomen/pelvis.  CTA chest (+)Nonocclusive saddle pulmonary embolus extending to the left lower lobe segmental and subsegmental levels as well as into the proximal right main pulmonary artery. Associated right heart strain. No pulmonary infarction.  Severe emphysema, multiple pulmonary nodules to follow-up outpatient. Heparin GTT per pharmacy was initiated.  Patient was admitted  to hospitalist service stepdown status with vascular consult for saddle PE. EDP had already consulted vascular who recommended heparin bolus and patient to be taken to the OR for thrombectomy in the a.m.  08/11: thrombectomy planned. I was contacted by radiologist Dr. Denna Haggard this morning and due to abnormal appearance of presumed clot and no evidence for DVT as well as significantly dilated RA, radiology is recommending CT chest with contrast to evaluate for potential mediastinal mass as opposed to pulmonary embolus, prior to taking her for thrombectomy.  Confirmed PE.  Also noted 2.3 cm right hilar lymph node, possible inflammatory, recommending follow-up CT with IV contrast 2 to 3 months. Echocardiogram --> LVEF 65 to 70%, normal function, normal diastolic parameters.  Right ventricular systolic function mildly reduced, moderately increased RV wall thickness, severely elevated pulmonary artery systolic pressure, right atrial size severely dilated.  Underwent thrombolysis/thrombectomy.  08/12: Remains on heparin drip.  HFNC - requiring 10 L/min this morning but improved to 5 L/min later in the day. Pt reports SOB is overall better. Transferred to progressive status.  08/13: still requiring 6 /min O2. D/c heparin gtt 48 hours after thrombectomy and transition Eliquis. RN voiced concern low UOP, continue strict I&O and encouraged po intake  08/14: requiring 8 L/min overnight and maintaining on 5 L/min O2 this morning, no output documented from yesterday. Rales/coarse breath sounds and JVD --> Lasix 60 mg IV x1 and restart home lasix. CXR obtained to futher eval possible underlying pneumonia - images reviewed, stable cardiomegaly and severe background emphysema no new consolidations.  08/15: paged pulmonary Dr [name] 10:53 AM for routine consult. Pt appears fluid overloaded based on LE edema, rales, JVD but given pulm HTN and underlying lung disease will also seek pulmonary recs if  any other interventions might  be helpful. Continue lasix for now and strict I&O, daily weights.  08/16: still needing 9L/min, confirmed pulmonary to see her today.  8/17: On 8 L nasal cannula oxygen.  Pulmonary following 8/18: on high flow with FiO2 of 35% and tolerating well with sat in mid-high 90's 8/19: Continues on high flow with FiO2 45%.  Continues to have cough with whitish sputum production. 8/20: Able to wean back to 8 L of oxygen.  Uses 3 L at baseline.  PT/OT are recommending SNF 8/21: Clinically seems stable, at 7 L of oxygen. 8/22: Remains stable and able to wean to 5 L. 8/23: Remains stable, apparently was put back to 7 L overnight, able to wean her back to 5 L again.  Patient do show some transient desaturation with ambulation, discussed with pulmonary and they are okay with transient desaturation and keeping the saturation around 88%. Had a bed offer-pending insurance authorization 8/25: Patient remained stable.  Still pending insurance authorization. 8/26: Received insurance authorization but SNF will not be able to take her before Monday.  Remained on 5 L. 8/27: Stable and hopefully leave tomorrow as facility was unable to take new patient over the weekend. 8/28: Patient remained stable and is being discharged to SNF for further rehab. Her home dose of aspirin was discontinued as she was started on Eliquis. She will continue on current medications and need to have a close follow-up with her pulmonologist and other providers.  Assessment and Plan: * Pulmonary emboli ([name]) S/p mechanical thrombectomy by vascular surgery.  Initially treated with heparin now transition to Eliquis. Echocardiogram --> LVEF 65 to 70%, normal function, normal diastolic parameters.  Right ventricular systolic function mildly reduced, moderately increased RV wall thickness, severely elevated pulmonary artery systolic pressure, right atrial size severely dilated. DVT negative.  Acute on chronic respiratory failure with hypoxia  ([name]) Secondary to pulmonary emboli Patient at baseline wears 3 L oxygen, currently on 5 L supplementation as needed for emphysema  Continue oxygen supplementation to maintain SpO2 greater than 92% Try weaning as tolerated Current continue Lasix 40 mg p.o. twice daily Pulmonology following    Pulmonary fibrosis ([place]) Pulmonary hypertension Pulmonology following Continue home tadalafil 20 mg daily.  Pulmonary hypertension ([name]) Seems stable. Pulmonology following, he has started patient on Ambrisentan and tadalafil  Continue Lasix 40 mg po bid, strict I&O  Mixed connective tissue disease ([name]) status post Plaquenil, prednisone, Imuran Currently on mycophenolate 500 mg p.o. twice daily.  Solu-Medrol 80 mg IV daily  Lymphadenopathy, mediastinal 2.3 cm right hilar lymph node, possible inflammatory, noted on CT chest 03/07/22  Outpatient follow-up CT with IV contrast 2 to 3 months  (HFpEF) heart failure with preserved ejection fraction ([place]) 03/16/22 evidenced by rales, LE edema, JVD 08/11 Echocardiogram --> LVEF 65 to 70%, normal function, normal diastolic parameters.  Right ventricular systolic function mildly reduced, moderately increased RV wall thickness, severely elevated pulmonary artery systolic pressure, right atrial size severely dilated. Continue Lasix 40 mg p.o. twice daily CXR no significant pulmonary edema Pulmonology following    Protein-calorie malnutrition, severe Complicates overall prognosis. -Dietitian consult  Emphysema, spiromety in office is c/w mild obstruction ([place]) Extensive lung disease, see above for pulmonary fibrosis On IV Solu-Medrol and nebulizer along with inhalers No home inhalers Not compliant w/ home O2 Pulmonology following DuoNeb prn          Consultants: Pulmonology, PCCM, vascular surgery Procedures performed: Mechanical thrombectomy and thrombolysis Disposition: Skilled nursing facility Diet recommendation:  Discharge Diet  Orders (From admission, onward)     Start     Ordered   03/24/22 0000  Diet - low sodium heart healthy        03/24/22 1008           Cardiac diet DISCHARGE MEDICATION: Allergies as of 03/24/2022       Reactions   Hydroxychloroquine    headaches   Meclizine    headaches        Medication List     STOP taking these medications    aspirin 81 MG tablet   naproxen sodium 220 MG tablet Commonly known as: ALEVE   omeprazole 20 MG capsule Commonly known as: PRILOSEC       TAKE these medications    amLODipine 10 MG tablet Commonly known as: NORVASC Take 10 mg by mouth daily. What changed: Another medication with the same name was removed. Continue taking this medication, and follow the directions you see here.   apixaban 5 MG Tabs tablet Commonly known as: ELIQUIS Take 1 tablet (5 mg total) by mouth 2 (two) times daily.   dextromethorphan-guaiFENesin 30-600 MG 12hr tablet Commonly known as: MUCINEX DM Take 1 tablet by mouth 2 (two) times daily as needed for cough.   feeding supplement Liqd Take 237 mLs by mouth 3 (three) times daily between meals.   fluticasone 50 MCG/ACT nasal spray Commonly known as: FLONASE Place 1 spray into both nostrils daily as needed for allergies or rhinitis.   furosemide 40 MG tablet Commonly known as: LASIX Take 1 tablet (40 mg total) by mouth 2 (two) times daily. What changed: when to take this   gabapentin 600 MG tablet Commonly known as: NEURONTIN Take 600 mg by mouth 3 (three) times daily.   hydrocortisone cream 1 % Apply topically 4 (four) times daily as needed for itching (hemorrhoid pain).   ipratropium-albuterol 0.5-2.5 (3) MG/3ML Soln Commonly known as: DUONEB Take 3 mLs by nebulization every 4 (four) hours as needed.   loratadine 10 MG tablet Commonly known as: CLARITIN Take 10 mg by mouth daily as needed for allergies.   LUBRICATING EYE DROPS OP Place 1 drop into both eyes daily as needed (dry eyes).    multivitamin with minerals tablet Take 1 tablet by mouth daily.   mycophenolate 500 MG tablet Commonly known as: CELLCEPT Take 500 mg by mouth 2 (two) times daily.   pantoprazole 40 MG tablet Commonly known as: PROTONIX Take 40 mg by mouth daily.   senna-docusate 8.6-50 MG tablet Commonly known as: Senokot-S Take 1 tablet by mouth at bedtime as needed for mild constipation.   sodium chloride 0.65 % Soln nasal spray Commonly known as: OCEAN Place 1 spray into both nostrils as needed for congestion.   tadalafil (PAH) 20 MG tablet Commonly known as: ADCIRCA TAKE 1 TABLET (20 MG TOTAL) BY MOUTH ONCE DAILY               Discharge Care Instructions  (From admission, onward)           Start     Ordered   03/24/22 0000  No dressing needed        03/24/22 1008            Contact information for follow-up providers     Dion Body, MD. Schedule an appointment as soon as possible for a visit.   Specialty: Family Medicine Contact information: [facility] [place] [address] [phone]  Erby Pian, MD. Schedule an appointment as soon as possible for a visit in 1 week(s).   Specialty: Specialist Contact information: [place] [address] [phone]              Contact information for after-discharge care     Destination     [facility] Preferred SNF .   Service: Skilled Nursing Contact information: [address] [place] [phone]                    Discharge Exam: Danley Danker Weights   03/07/22 1216 03/10/22 1743 03/24/22 0600  Weight: 63.5 kg 68.4 kg 63.5 kg   General.     In no acute distress. Pulmonary.  Few dry crackles bilaterally, normal respiratory effort. CV.  Regular rate and rhythm, no JVD, rub or murmur. Abdomen.  Soft, nontender, nondistended, BS positive. CNS.  Alert and oriented .  No focal  neurologic deficit. Extremities.  1+ LE edema, no cyanosis, pulses intact and symmetrical. Psychiatry.  Judgment and insight appears normal.   Condition at discharge: stable  The results of significant diagnostics from this hospitalization (including imaging, microbiology, ancillary and laboratory) are listed below for reference.   Imaging Studies: DG Chest Port 1 View  Result Date: 03/10/2022 CLINICAL DATA:  Leg swelling EXAM: PORTABLE CHEST 1 VIEW COMPARISON:  Chest x-ray dated March 06, 2022; chest CT dated March 07, 2022 FINDINGS: Unchanged cardiomegaly. Severe background emphysema with areas of nodularity, better evaluated on recent chest CT. No new focal consolidation. The visualized skeletal structures are unremarkable. IMPRESSION: Severe emphysema with no new focal consolidation. Electronically Signed   By: Yetta Glassman M.D.   On: 03/10/2022 10:25   PERIPHERAL VASCULAR CATHETERIZATION  Result Date: 03/07/2022 See surgical note for result.  ECHOCARDIOGRAM COMPLETE  Result Date: 03/07/2022    ECHOCARDIOGRAM REPORT   Patient Name:   CAYLA WIEGAND Date of Exam: 03/07/2022 Medical Rec #:  [id number]        Height:       65.0 in Accession #:    [id number]       Weight:       142.4 lb Date of Birth:  07-26-1961        BSA:          1.712 m Patient Age:    28 years         BP:           118/100 mmHg Patient Gender: F                HR:           95 bpm. Exam Location:  [facility] Procedure: 2D Echo, Cardiac Doppler and Color Doppler Indications:     Pulmonary Embolus I26.09  History:         Patient has no prior history of Echocardiogram examinations.                  COPD and Pulmonary HTN; Risk Factors:Hypertension.  Sonographer:     Sherrie Sport Referring Phys:  [id number] AMY N COX Diagnosing Phys: Serafina Royals MD IMPRESSIONS  1. Left ventricular ejection fraction, by estimation, is 65 to 70%. The left ventricle has normal function. The left ventricle has no regional wall motion abnormalities.  Left ventricular diastolic parameters were normal.  2. Right ventricular systolic function is mildly reduced. The right ventricular size is severely enlarged. Moderately increased right ventricular wall thickness.  There is severely elevated pulmonary artery systolic pressure.  3. Right atrial size was severely dilated.  4. The mitral valve is normal in structure. Trivial mitral valve regurgitation.  5. Tricuspid valve regurgitation is severe.  6. The aortic valve is normal in structure. Aortic valve regurgitation is trivial. FINDINGS  Left Ventricle: Left ventricular ejection fraction, by estimation, is 65 to 70%. The left ventricle has normal function. The left ventricle has no regional wall motion abnormalities. The left ventricular internal cavity size was small. There is no left ventricular hypertrophy. Left ventricular diastolic parameters were normal. Right Ventricle: The right ventricular size is severely enlarged. Moderately increased right ventricular wall thickness. Right ventricular systolic function is mildly reduced. There is severely elevated pulmonary artery systolic pressure. Left Atrium: Left atrial size was normal in size. Right Atrium: Right atrial size was severely dilated. Pericardium: There is no evidence of pericardial effusion. Mitral Valve: The mitral valve is normal in structure. Trivial mitral valve regurgitation. Tricuspid Valve: The tricuspid valve is normal in structure. Tricuspid valve regurgitation is severe. Aortic Valve: The aortic valve is normal in structure. Aortic valve regurgitation is trivial. Aortic valve mean gradient measures 2.0 mmHg. Aortic valve peak gradient measures 2.8 mmHg. Aortic valve area, by VTI measures 2.37 cm. Pulmonic Valve: The pulmonic valve was normal in structure. Pulmonic valve regurgitation is mild. Aorta: The aortic root and ascending aorta are structurally normal, with no evidence of dilitation. IAS/Shunts: No atrial level shunt detected by color  flow Doppler.  LEFT VENTRICLE PLAX 2D LVIDd:         2.60 cm     Diastology LVIDs:         2.00 cm     LV e' medial:    5.87 cm/s LV PW:         1.20 cm     LV E/e' medial:  6.0 LV IVS:        1.20 cm     LV e' lateral:   11.30 cm/s LVOT diam:     2.00 cm     LV E/e' lateral: 3.1 LV SV:         25 LV SV Index:   15 LVOT Area:     3.14 cm  LV Volumes (MOD) LV vol d, MOD A4C: 17.0 ml LV vol s, MOD A4C: 10.5 ml LV SV MOD A4C:     17.0 ml RIGHT VENTRICLE RV Basal diam:  4.30 cm RV S prime:     7.72 cm/s TAPSE (M-mode): 1.6 cm LEFT ATRIUM             Index        RIGHT ATRIUM           Index LA diam:        3.40 cm 1.99 cm/m   RA Area:     42.90 cm LA Vol (A2C):   67.6 ml 39.48 ml/m  RA Volume:   218.00 ml 127.31 ml/m LA Vol (A4C):   17.7 ml 10.34 ml/m LA Biplane Vol: 35.7 ml 20.85 ml/m  AORTIC VALVE AV Area (Vmax):    2.32 cm AV Area (Vmean):   2.02 cm AV Area (VTI):     2.37 cm AV Vmax:           83.20 cm/s AV Vmean:          59.100 cm/s AV VTI:            0.106 m AV Peak Grad:  2.8 mmHg AV Mean Grad:      2.0 mmHg LVOT Vmax:         61.40 cm/s LVOT Vmean:        38.000 cm/s LVOT VTI:          0.080 m LVOT/AV VTI ratio: 0.75  AORTA Ao Root diam: 3.15 cm MITRAL VALVE               TRICUSPID VALVE MV Area (PHT): 8.92 cm    TR Peak grad:   74.0 mmHg MV Decel Time: 85 msec     TR Vmax:        430.00 cm/s MV E velocity: 35.10 cm/s MV A velocity: 74.60 cm/s  SHUNTS MV E/A ratio:  0.47        Systemic VTI:  0.08 m                            Systemic Diam: 2.00 cm Serafina Royals MD Electronically signed by Serafina Royals MD Signature Date/Time: 03/07/2022/12:58:25 PM    Final    CT CHEST W CONTRAST  Result Date: 03/07/2022 CLINICAL DATA:  Soft tissue mass. EXAM: CT CHEST WITH CONTRAST TECHNIQUE: Multidetector CT imaging of the chest was performed during intravenous contrast administration. RADIATION DOSE REDUCTION: This exam was performed according to the departmental dose-optimization program which includes  automated exposure control, adjustment of the mA and/or kV according to patient size and/or use of iterative reconstruction technique. CONTRAST:  32m OMNIPAQUE IOHEXOL 300 MG/ML  SOLN COMPARISON:  CTA of the chest of March 06, 2022. FINDINGS: Cardiovascular: As noted on the exam performed yesterday, there is again noted large saddle embolus extending from proximal right pulmonary artery and through left pulmonary artery into the left lower lobe branches. This is not significantly changed compared to yesterday. Atherosclerosis of thoracic aorta is noted without aneurysm or dissection. No pericardial effusion is noted. Mild cardiomegaly is noted. Mediastinum/Nodes: Thyroid gland is unremarkable. The esophagus appears normal. 2.3 cm right hilar lymph node is noted. Lungs/Pleura: No pneumothorax or pleural effusion is noted. Emphysematous disease and scarring is noted throughout both lungs. Stable 5 mm nodule is noted in right lower lobe best seen on image number 73 of series 3. Stable 3 mm nodule is noted in left upper lobe best seen on image number 39 of series 3. Stable 3 mm nodules also seen in left upper lobe best seen on image number 49 of series 3. Upper Abdomen: No acute abnormality. Musculoskeletal: No chest wall abnormality. No acute or significant osseous findings. IMPRESSION: There is stable large pulmonary embolus extending primarily through the left pulmonary artery and into the left lower lobe branches as described on prior exam. 2.3 cm right hilar lymph node is noted which may be inflammatory in etiology, but neoplasm cannot be excluded. Follow-up CT scan with intravenous contrast is recommended in 2-3 months to evaluate stability. Extensive emphysematous disease and scarring is noted throughout both lungs as described on prior exam. Multiple pulmonary nodules are again noted as described on prior exam performed yesterday, with the largest measuring 5 mm in the right lower lobe. No follow-up needed if  patient is low-risk (and has no known or suspected primary neoplasm). Non-contrast chest CT can be considered in 12 months if patient is high-risk. This recommendation follows the consensus statement: Guidelines for Management of Incidental Pulmonary Nodules Detected on CT Images: From the Fleischner Society 2017; Radiology 2017; 284:228-243. Aortic Atherosclerosis (ICD10-I70.0)  and Emphysema (ICD10-J43.9). Electronically Signed   By: Marijo Conception M.D.   On: 03/07/2022 11:14   US Venous Img Lower Bilateral (DVT)  Result Date: 03/07/2022 CLINICAL DATA:  Patient with pulmonary embolism.  Evaluate for DVT. EXAM: BILATERAL LOWER EXTREMITY VENOUS DOPPLER ULTRASOUND TECHNIQUE: Gray-scale sonography with graded compression, as well as color Doppler and duplex ultrasound were performed to evaluate the lower extremity deep venous systems from the level of the common femoral vein and including the common femoral, femoral, profunda femoral, popliteal and calf veins including the posterior tibial, peroneal and gastrocnemius veins when visible. The superficial great saphenous vein was also interrogated. Spectral Doppler was utilized to evaluate flow at rest and with distal augmentation maneuvers in the common femoral, femoral and popliteal veins. COMPARISON:  None Available. FINDINGS: RIGHT LOWER EXTREMITY Common Femoral Vein: No evidence of thrombus. Normal compressibility, respiratory phasicity and response to augmentation. Saphenofemoral Junction: No evidence of thrombus. Normal compressibility and flow on color Doppler imaging. Profunda Femoral Vein: No evidence of thrombus. Normal compressibility and flow on color Doppler imaging. Femoral Vein: No evidence of thrombus. Normal compressibility, respiratory phasicity and response to augmentation. Popliteal Vein: No evidence of thrombus. Normal compressibility, respiratory phasicity and response to augmentation. Calf Veins: No evidence of thrombus. Normal compressibility  and flow on color Doppler imaging. Other Findings:  None. LEFT LOWER EXTREMITY Common Femoral Vein: No evidence of thrombus. Normal compressibility, respiratory phasicity and response to augmentation. Saphenofemoral Junction: No evidence of thrombus. Normal compressibility and flow on color Doppler imaging. Profunda Femoral Vein: No evidence of thrombus. Normal compressibility and flow on color Doppler imaging. Femoral Vein: No evidence of thrombus. Normal compressibility, respiratory phasicity and response to augmentation. Popliteal Vein: No evidence of thrombus. Normal compressibility, respiratory phasicity and response to augmentation. Calf Veins: No evidence of thrombus. Normal compressibility and flow on color Doppler imaging. Other Findings:  None. IMPRESSION: No evidence of deep venous thrombosis in either lower extremity. Electronically Signed   By: Markus Daft M.D.   On: 03/07/2022 07:29   CT Angio Chest PE W/Cm &/Or Wo Cm  Result Date: 03/06/2022 CLINICAL DATA:  CT Abdomen Pelvis W Contrast suspected, high prob Abdominal pain, acute, nonlocalized EXAM: CT ANGIOGRAPHY CHEST WITH CONTRAST TECHNIQUE: Multidetector CT imaging of the chest was performed using the standard protocol during bolus administration of intravenous contrast. Multiplanar CT image reconstructions and MIPs were obtained to evaluate the vascular anatomy. RADIATION DOSE REDUCTION: This exam was performed according to the departmental dose-optimization program which includes automated exposure control, adjustment of the mA and/or kV according to patient size and/or use of iterative reconstruction technique. CONTRAST:  96m OMNIPAQUE IOHEXOL 350 MG/ML SOLN COMPARISON:  CT chest 03/05/2020 FINDINGS: Cardiovascular: Satisfactory opacification of the pulmonary arteries to the segmental level. Nonocclusive saddle embolus extending to the left lower lobe segmental and subsegmental levels as well as into the proximal right main pulmonary artery.  Enlarged right heart with marked enlargement of the right atria. There is increased right to left ventricular ratio of at least 1.5. Retrograde reflux of intravenous contrast into a dilated inferior vena cava and hepatic veins. The main pulmonary artery is enlarged measuring up to 3.7 cm. No significant pericardial effusion. The thoracic aorta is normal in caliber. No atherosclerotic plaque of the thoracic aorta. No coronary artery calcifications. Mediastinum/Nodes: No enlarged mediastinal, hilar, or axillary lymph nodes. Thyroid gland, trachea, and esophagus demonstrate no significant findings. Lungs/Pleura: Severe centrilobular and paraseptal emphysematous changes. Scattered areas of reticulations, scarring, and bronchiectasis.  No focal consolidation. 0.8 x 0.4 cm right middle lobe pulmonary nodule. Adjacent 0.7 x 0.6 cm ground-glass subpleural nodule (6:49). 0.5 x 0.5 cm left upper lobe pulmonary nodule (6:33). Left upper lobe pulmonary micronodule (6:27). Several other scattered pulmonary micronodules. No pulmonary mass. No pleural effusion. No pneumothorax. Upper Abdomen: Please see separately dictated CT abdomen pelvis 03/06/2022 Musculoskeletal: Subcutaneus soft tissue edema. No suspicious lytic or blastic osseous lesions. No acute displaced fracture. Multilevel degenerative changes of the spine. Chronic appearing minimal vertebral body height loss of the L4-L5 level. Review of the MIP images confirms the above findings. IMPRESSION: 1. Nonocclusive saddle pulmonary embolus extending to the left lower lobe segmental and subsegmental levels as well as into the proximal right main pulmonary artery. Associated right heart strain. No pulmonary infarction. 2.  Emphysema (ICD10-J43.9) - severe. 3. Scattered pulmonary micronodules as well as a 0.8 x 0.4 cm right middle lobe pulmonary nodule and adjacent 0.7 x 0.6 cm ground-glass subpleural nodule. Initial follow-up with CT at 6 months is recommended to confirm  persistence. If persistent, repeat CT is recommended every 2 years until 5 years of stability has been established. This recommendation follows the consensus statement: Guidelines for Management of Incidental Pulmonary Nodules Detected on CT Images: From the Fleischner Society 2017; Radiology 2017; 284:228-243. These results were called by telephone at the time of interpretation on 03/06/2022 at 3:21 pm to provider [facility] , who verbally acknowledged these results. Electronically Signed   By: Iven Finn M.D.   On: 03/06/2022 15:32   CT Abdomen Pelvis W Contrast  Result Date: 03/06/2022 CLINICAL DATA:  Abdominal pain EXAM: CT ABDOMEN AND PELVIS WITH CONTRAST TECHNIQUE: Multidetector CT imaging of the abdomen and pelvis was performed using the standard protocol following bolus administration of intravenous contrast. RADIATION DOSE REDUCTION: This exam was performed according to the departmental dose-optimization program which includes automated exposure control, adjustment of the mA and/or kV according to patient size and/or use of iterative reconstruction technique. CONTRAST:  34m OMNIPAQUE IOHEXOL 350 MG/ML SOLN COMPARISON:  None Available. FINDINGS: Lower chest: Centrilobular and panlobular emphysema is seen in the lower lung fields. Prominence of interstitial markings in the lower lung fields suggest scarring. Heart is enlarged in size. Hepatobiliary: Liver measures 18.3 cm in length. There is fatty infiltration. There is no dilation of bile ducts. Small amount of fluid adjacent to the gallbladder may be part of ascites. There is no significant wall thickening in gallbladder. Gallbladder is not distended. Pancreas: No focal abnormalities are seen. Spleen: Spleen is not seen. Surgical clips on the left hemidiaphragms suggest possible previous splenectomy. Adrenals/Urinary Tract: There is mild hyperplasia of left adrenal. There is no hydronephrosis. There are no renal or ureteral stones. Urinary  bladder is not distended. Stomach/Bowel: Stomach is not distended. Small bowel loops are not dilated. Appendix is not seen. There is no significant wall thickening in colon. Vascular/Lymphatic: Unremarkable. Reproductive: Unremarkable. Other: Small ascites is present. There is no pneumoperitoneum. Left inguinal hernia containing fat is seen. There is subcutaneous edema suggesting anasarca. Musculoskeletal: Slight decrease in height of upper end plate of body of L4 vertebra may be residual from previous injury. Degenerative changes are noted in the lumbar spine, more so at L4-L5 and L5-S1 levels. There is minimal retrolisthesis at L4-L5 level. IMPRESSION: There is no evidence of intestinal obstruction or pneumoperitoneum. There is no hydronephrosis. Enlarged fatty liver. Small ascites. Severe COPD. Lumbar spondylosis. Electronically Signed   By: PElmer PickerM.D.   On: 03/06/2022 15:23  DG Chest 2 View  Result Date: 03/06/2022 CLINICAL DATA:  Shortness of breath. EXAM: CHEST - 2 VIEW COMPARISON:  CT scan of March 05, 2020. FINDINGS: Mild cardiomegaly is noted. Emphysematous disease is noted throughout both lungs as well as coarse reticular opacities concerning for scarring or fibrosis. No definite acute abnormality is noted. Bony thorax is unremarkable. IMPRESSION: Emphysematous changes noted bilaterally as well as coarse reticular opacities bilaterally concerning for fibrosis or scarring. No definite acute abnormality is noted. Aortic Atherosclerosis (ICD10-I70.0) and Emphysema (ICD10-J43.9). Electronically Signed   By: Marijo Conception M.D.   On: 03/06/2022 14:05    Microbiology: Results for orders placed or performed during the hospital encounter of 03/06/22  MRSA Next Gen by PCR, Nasal     Status: None   Collection Time: 03/06/22  5:07 PM   Specimen: Nasal Mucosa; Nasal Swab  Result Value Ref Range Status   MRSA by PCR Next Gen NOT DETECTED NOT DETECTED Final    Comment: (NOTE) The GeneXpert  MRSA Assay (FDA approved for NASAL specimens only), is one component of a comprehensive MRSA colonization surveillance program. It is not intended to diagnose MRSA infection nor to guide or monitor treatment for MRSA infections. Test performance is not FDA approved in patients less than 110 years old. Performed at [facility], [address] [place], [place] [address]     Labs: CBC: Recent Labs  Lab 03/18/22 0440 03/23/22 0405  WBC 6.2 6.3  HGB 13.3 12.2  HCT 40.1 37.0  MCV 95.0 94.1  PLT 297 974   Basic Metabolic Panel: No results for input(s): "NA", "K", "CL", "CO2", "GLUCOSE", "BUN", "CREATININE", "CALCIUM", "MG", "PHOS" in the last 168 hours. Liver Function Tests: Recent Labs  Lab 03/18/22 0440  AST 52*  ALT 38  ALKPHOS 200*  BILITOT 0.6  PROT 6.2*  ALBUMIN 2.4*   CBG: No results for input(s): "GLUCAP" in the last 168 hours.  Discharge time spent: greater than 30 minutes.  This record has been created using Systems analyst. Errors have been sought and corrected,but may not always be located. Such creation errors do not reflect on the standard of care.   Signed: Lorella Nimrod, MD [facility] 03/24/2022

## 2022-03-24 NOTE — Care Management Important Message (Signed)
Important Message  Patient Details  Name: BRADEN DELOACH MRN: [id number] Date of Birth: 1961/02/21   Medicare Important Message Given:  Yes     Dannette Barbara 03/24/2022, 11:19 AM

## 2022-03-24 NOTE — TOC Transition Note (Signed)
Transition of Care [facility]) - CM/SW Discharge Note   Patient Details  Name: DORLEEN KISSEL MRN: [id number] Date of Birth: 1960/10/25  Transition of Care [facility] CM/SW Contact:  Candie Chroman, LCSW Phone Number: 03/24/2022, 10:49 AM   Clinical Narrative:   Patient has orders to discharge to [facility] today. RN will call report to [phone] (Room 709). EMS transport has been arranged and she is first on the list. EMS needed due to high oxygen requirements. No further concerns. CSW signing off.  Final next level of care: Skilled Nursing Facility Barriers to Discharge: Barriers Resolved   Patient Goals and CMS Choice   CMS Medicare.gov Compare Post Acute Care list provided to:: Patient Choice offered to / list presented to : Patient  Discharge Placement PASRR number recieved: 03/18/22            Patient chooses bed at: [facility] Patient to be transferred to facility by: EMS   Patient and family notified of of transfer: 03/24/22  Discharge Plan and Services     Post Acute Care Choice: [facility]                               Social Determinants of Health (SDOH) Interventions     Readmission Risk Interventions     No data to display

## 2022-08-20 DIAGNOSIS — Z7682 Awaiting organ transplant status: Secondary | ICD-10-CM | POA: Insufficient documentation

## 2022-08-26 ENCOUNTER — Ambulatory Visit: Payer: 59 | Admitting: Obstetrics and Gynecology

## 2022-09-30 ENCOUNTER — Ambulatory Visit: Payer: 59 | Admitting: Obstetrics and Gynecology

## 2022-10-13 ENCOUNTER — Telehealth: Payer: Self-pay

## 2022-10-13 ENCOUNTER — Encounter: Payer: Self-pay | Admitting: Family Medicine

## 2022-10-13 DIAGNOSIS — Z1231 Encounter for screening mammogram for malignant neoplasm of breast: Secondary | ICD-10-CM

## 2022-10-13 NOTE — Telephone Encounter (Signed)
Patient contacted office to have order placed in chart for mammogram. KW

## 2022-11-05 NOTE — Progress Notes (Unsigned)
PCP: Marisue Ivan, MD   No chief complaint on file.   HPI:      [name] is a 62 y.o. G1P1 whose LMP was No LMP recorded. Patient is postmenopausal., presents today for her annual examination.  Her menses are absent due to menopause, no PMB.  She {does:18564} have vasomotor sx.   Sex activity: {sex active: 315163}. She {does:18564} have vaginal dryness.  Last Pap: 08/02/21  Results were: no abnormalities /neg HPV DNA.  Hx of STDs: {STD hx:14358}  Last mammogram: 08/07/21 Results were: normal--routine follow-up in 12 months; has appt today There is no FH of breast cancer. There is no FH of ovarian cancer. The patient {does:18564} do self-breast exams.  Colonoscopy: 2/23 at [facility];  Repeat due after 10 years.   Tobacco use: {tob:20664} Alcohol use: {Alcohol:11675} No drug use Exercise: {exercise:31265}  She {does:18564} get adequate calcium and Vitamin D in her diet.  Labs with PCP Hx of pulmonary HTN, followed at [facility]   Patient Active Problem List   Diagnosis Date Noted   Protein-calorie malnutrition, severe 03/11/2022   (HFpEF) heart failure with preserved ejection fraction 03/10/2022   Pressure injury of skin 03/09/2022   Emphysema, spiromety in office is c/w mild obstruction ([name]) 03/07/2022   Lymphadenopathy, mediastinal 03/07/2022   Pulmonary emboli 03/06/2022   GERD (gastroesophageal reflux disease) 03/06/2022   Acute on chronic respiratory failure with hypoxia 03/06/2022   Encounter for general adult medical examination without abnormal findings 11/02/2018   Pulmonary fibrosis 12/14/2013   Pulmonary hypertension 12/14/2013   Mixed connective tissue disease 12/13/2013   Lumbar disc disease 12/13/2013    Past Surgical History:  Procedure Laterality Date   bone removal from pinkie toe     COLONOSCOPY WITH PROPOFOL N/A 08/30/2021   Procedure: COLONOSCOPY WITH PROPOFOL;  Surgeon: Wyline Mood, MD;  Location: [facility] ENDOSCOPY;  Service:  Gastroenterology;  Laterality: N/A;   DILATION AND CURETTAGE OF UTERUS     PULMONARY THROMBECTOMY N/A 03/07/2022   Procedure: PULMONARY THROMBECTOMY;  Surgeon: Annice Needy, MD;  Location: [facility] INVASIVE CV LAB;  Service: Cardiovascular;  Laterality: N/A;   RIGHT HEART CATH Right 09/17/2018   Procedure: RIGHT HEART CATH;  Surgeon: Dalia Heading, MD;  Location: [facility] INVASIVE CV LAB;  Service: Cardiovascular;  Laterality: Right;   SPLENECTOMY     TUBAL LIGATION      Family History  Problem Relation Age of Onset   Uterine cancer Mother    Stroke Father    Breast cancer Neg Hx     Social History   Socioeconomic History   Marital status: Widowed    Spouse name: Not on file   Number of children: Not on file   Years of education: Not on file   Highest education level: Not on file  Occupational History   Not on file  Tobacco Use   Smoking status: Former    Packs/day: .5    Types: Cigarettes    Quit date: 01/08/2011    Years since quitting: 11.8   Smokeless tobacco: Never  Vaping Use   Vaping Use: Never used  Substance and Sexual Activity   Alcohol use: No   Drug use: No   Sexual activity: Not Currently  Other Topics Concern   Not on file  Social History Narrative   Not on file   Social Determinants of Health   Financial Resource Strain: Not on file  Food Insecurity: Not on file  Transportation Needs: Not on  file  Physical Activity: Not on file  Stress: Not on file  Social Connections: Not on file  Intimate Partner Violence: Not on file     Current Outpatient Medications:    amLODipine (NORVASC) 10 MG tablet, Take 10 mg by mouth daily., Disp: , Rfl:    apixaban (ELIQUIS) 5 MG TABS tablet, Take 1 tablet (5 mg total) by mouth 2 (two) times daily., Disp: 60 tablet, Rfl:    Carboxymethylcellul-Glycerin (LUBRICATING EYE DROPS OP), Place 1 drop into both eyes daily as needed (dry eyes)., Disp: , Rfl:    dextromethorphan-guaiFENesin (MUCINEX DM) 30-600 MG 12hr tablet, Take  1 tablet by mouth 2 (two) times daily as needed for cough., Disp: , Rfl:    feeding supplement (ENSURE ENLIVE / ENSURE PLUS) LIQD, Take 237 mLs by mouth 3 (three) times daily between meals., Disp: 237 mL, Rfl: 12   fluticasone (FLONASE) 50 MCG/ACT nasal spray, Place 1 spray into both nostrils daily as needed for allergies or rhinitis., Disp: , Rfl:    furosemide (LASIX) 40 MG tablet, Take 1 tablet (40 mg total) by mouth 2 (two) times daily., Disp: 30 tablet, Rfl:    gabapentin (NEURONTIN) 600 MG tablet, Take 600 mg by mouth 3 (three) times daily. , Disp: , Rfl:    hydrocortisone cream 1 %, Apply topically 4 (four) times daily as needed for itching (hemorrhoid pain)., Disp: 30 g, Rfl: 0   ipratropium-albuterol (DUONEB) 0.5-2.5 (3) MG/3ML SOLN, Take 3 mLs by nebulization every 4 (four) hours as needed., Disp: 360 mL, Rfl:    loratadine (CLARITIN) 10 MG tablet, Take 10 mg by mouth daily as needed for allergies., Disp: , Rfl:    Multiple Vitamins-Minerals (MULTIVITAMIN WITH MINERALS) tablet, Take 1 tablet by mouth daily., Disp: , Rfl:    mycophenolate (CELLCEPT) 500 MG tablet, Take 500 mg by mouth 2 (two) times daily., Disp: , Rfl:    pantoprazole (PROTONIX) 40 MG tablet, Take 40 mg by mouth daily., Disp: , Rfl:    senna-docusate (SENOKOT-S) 8.6-50 MG tablet, Take 1 tablet by mouth at bedtime as needed for mild constipation., Disp: , Rfl:    sodium chloride (OCEAN) 0.65 % SOLN nasal spray, Place 1 spray into both nostrils as needed for congestion., Disp: , Rfl: 0   tadalafil, PAH, (ADCIRCA) 20 MG tablet, TAKE 1 TABLET (20 MG TOTAL) BY MOUTH ONCE DAILY, Disp: , Rfl:      ROS:  Review of Systems BREAST: No symptoms    Objective: There were no vitals taken for this visit.   OBGyn Exam  Results: No results found for this or any previous visit (from the past 24 hour(s)).  Assessment/Plan:  No diagnosis found.   No orders of the defined types were placed in this encounter.            GYN counsel {counseling: 16159}    F/U  No follow-ups on file.  Demarious Kapur B. Ralph Benavidez, PA-C 11/05/2022 12:14 PM

## 2022-11-06 ENCOUNTER — Encounter: Payer: Self-pay | Admitting: Obstetrics and Gynecology

## 2022-11-06 ENCOUNTER — Ambulatory Visit
Admission: RE | Admit: 2022-11-06 | Discharge: 2022-11-06 | Disposition: A | Discharge status: Home / Self Care | Payer: 59 | Source: Ambulatory Visit | Attending: Obstetrics and Gynecology | Admitting: Obstetrics and Gynecology

## 2022-11-06 ENCOUNTER — Ambulatory Visit (INDEPENDENT_AMBULATORY_CARE_PROVIDER_SITE_OTHER): Payer: 59 | Admitting: Obstetrics and Gynecology

## 2022-11-06 VITALS — BP 100/64 | Ht 65.0 in | Wt 141.0 lb

## 2022-11-06 DIAGNOSIS — K5900 Constipation, unspecified: Secondary | ICD-10-CM

## 2022-11-06 DIAGNOSIS — Z01419 Encounter for gynecological examination (general) (routine) without abnormal findings: Secondary | ICD-10-CM

## 2022-11-06 DIAGNOSIS — Z1231 Encounter for screening mammogram for malignant neoplasm of breast: Secondary | ICD-10-CM

## 2022-11-06 DIAGNOSIS — Z1211 Encounter for screening for malignant neoplasm of colon: Secondary | ICD-10-CM

## 2022-11-06 NOTE — Patient Instructions (Signed)
I value your feedback and you entrusting us with your care. If you get a [facility] patient survey, I would appreciate you taking the time to let us know about your experience today. Thank you! ? ? ?

## 2023-01-12 DIAGNOSIS — N1831 Chronic kidney disease, stage 3a: Secondary | ICD-10-CM | POA: Insufficient documentation

## 2023-01-30 ENCOUNTER — Other Ambulatory Visit: Payer: Self-pay

## 2023-01-30 ENCOUNTER — Emergency Department
Admission: EM | Admit: 2023-01-30 | Discharge: 2023-01-30 | Disposition: A | Discharge status: Other Acute Inpatient Hospital | Payer: 59 | Source: Home / Self Care | Attending: Emergency Medicine | Admitting: Emergency Medicine

## 2023-01-30 ENCOUNTER — Emergency Department: Payer: 59

## 2023-01-30 ENCOUNTER — Encounter: Payer: Self-pay | Admitting: Emergency Medicine

## 2023-01-30 DIAGNOSIS — J9601 Acute respiratory failure with hypoxia: Secondary | ICD-10-CM | POA: Insufficient documentation

## 2023-01-30 DIAGNOSIS — R55 Syncope and collapse: Secondary | ICD-10-CM | POA: Diagnosis not present

## 2023-01-30 DIAGNOSIS — J9621 Acute and chronic respiratory failure with hypoxia: Secondary | ICD-10-CM

## 2023-01-30 DIAGNOSIS — N179 Acute kidney failure, unspecified: Secondary | ICD-10-CM | POA: Diagnosis not present

## 2023-01-30 DIAGNOSIS — I272 Pulmonary hypertension, unspecified: Secondary | ICD-10-CM

## 2023-01-30 DIAGNOSIS — J449 Chronic obstructive pulmonary disease, unspecified: Secondary | ICD-10-CM | POA: Diagnosis not present

## 2023-01-30 DIAGNOSIS — J439 Emphysema, unspecified: Secondary | ICD-10-CM | POA: Diagnosis present

## 2023-01-30 DIAGNOSIS — E876 Hypokalemia: Secondary | ICD-10-CM | POA: Diagnosis not present

## 2023-01-30 DIAGNOSIS — J8410 Pulmonary fibrosis, unspecified: Secondary | ICD-10-CM

## 2023-01-30 DIAGNOSIS — M351 Other overlap syndromes: Secondary | ICD-10-CM

## 2023-01-30 DIAGNOSIS — R0602 Shortness of breath: Secondary | ICD-10-CM | POA: Diagnosis present

## 2023-01-30 DIAGNOSIS — I50811 Acute right heart failure: Secondary | ICD-10-CM | POA: Diagnosis not present

## 2023-01-30 DIAGNOSIS — I50810 Right heart failure, unspecified: Secondary | ICD-10-CM | POA: Insufficient documentation

## 2023-01-30 DIAGNOSIS — J438 Other emphysema: Secondary | ICD-10-CM

## 2023-01-30 LAB — CBC
HCT: 37.8 % (ref 36.0–46.0)
Hemoglobin: 12.2 g/dL (ref 12.0–15.0)
MCH: 31.9 pg (ref 26.0–34.0)
MCHC: 32.3 g/dL (ref 30.0–36.0)
MCV: 99 fL (ref 80.0–100.0)
Platelets: 182 10*3/uL (ref 150–400)
RBC: 3.82 MIL/uL — ABNORMAL LOW (ref 3.87–5.11)
RDW: 17.3 % — ABNORMAL HIGH (ref 11.5–15.5)
WBC: 4.5 10*3/uL (ref 4.0–10.5)
nRBC: 0 % (ref 0.0–0.2)

## 2023-01-30 LAB — BASIC METABOLIC PANEL
Anion gap: 12 (ref 5–15)
BUN: 51 mg/dL — ABNORMAL HIGH (ref 8–23)
CO2: 29 mmol/L (ref 22–32)
Calcium: 9 mg/dL (ref 8.9–10.3)
Chloride: 94 mmol/L — ABNORMAL LOW (ref 98–111)
Creatinine, Ser: 1.74 mg/dL — ABNORMAL HIGH (ref 0.44–1.00)
GFR, Estimated: 33 mL/min — ABNORMAL LOW (ref >60)
Glucose, Bld: 144 mg/dL — ABNORMAL HIGH (ref 70–99)
Potassium: 2.6 mmol/L — CL (ref 3.5–5.1)
Sodium: 135 mmol/L (ref 135–145)

## 2023-01-30 LAB — TROPONIN I (HIGH SENSITIVITY)
Troponin I (High Sensitivity): 30 ng/L — ABNORMAL HIGH (ref <18)
Troponin I (High Sensitivity): 35 ng/L — ABNORMAL HIGH (ref <18)

## 2023-01-30 LAB — BRAIN NATRIURETIC PEPTIDE: B Natriuretic Peptide: 774.5 pg/mL — ABNORMAL HIGH (ref 0.0–100.0)

## 2023-01-30 LAB — MAGNESIUM: Magnesium: 1.8 mg/dL (ref 1.7–2.4)

## 2023-01-30 MED ORDER — SODIUM CHLORIDE 0.9 % IV BOLUS
500.0000 mL | Freq: Once | INTRAVENOUS | Status: AC
  Administered 2023-01-30: 500 mL via INTRAVENOUS

## 2023-01-30 MED ORDER — IPRATROPIUM-ALBUTEROL 0.5-2.5 (3) MG/3ML IN SOLN
3.0000 mL | Freq: Once | RESPIRATORY_TRACT | Status: AC
  Administered 2023-01-30: 3 mL via RESPIRATORY_TRACT
  Filled 2023-01-30: qty 3

## 2023-01-30 MED ORDER — IOHEXOL 350 MG/ML SOLN
75.0000 mL | Freq: Once | INTRAVENOUS | Status: AC | PRN
  Administered 2023-01-30: 75 mL via INTRAVENOUS

## 2023-01-30 MED ORDER — POTASSIUM CHLORIDE 10 MEQ/100ML IV SOLN
10.0000 meq | INTRAVENOUS | Status: AC
  Administered 2023-01-30 (×2): 10 meq via INTRAVENOUS
  Filled 2023-01-30 (×2): qty 100

## 2023-01-30 MED ORDER — METHYLPREDNISOLONE SODIUM SUCC 125 MG IJ SOLR
125.0000 mg | INTRAMUSCULAR | Status: AC
  Administered 2023-01-30: 125 mg via INTRAVENOUS
  Filled 2023-01-30: qty 2

## 2023-01-30 NOTE — ED Notes (Signed)
Call [facility] transfere back the accepting doctor Gerline Legacy, Bjorn Loser said she would call when they have gotten a bed.

## 2023-01-30 NOTE — Consult Note (Signed)
Initial Consultation Note   Patient: Tammy Barrett ZOX:[id number] DOB: 17-Nov-1960 PCP: Marisue Ivan, MD DOA: 01/30/2023 DOS: the patient was seen and examined on 01/30/2023 Primary service: Sharyn Creamer, MD  Referring physician: Dr. 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  She is presenting with no shortness of breath, however profound symptomatic hypoxia requiring 12 to 13 L of heated high flow nasal cannula.  Differential at this point is broad but includes acute exacerbation of PF versus COPD versus exacerbation of pulmonary hypertension.  No evidence of new opacities on imaging to suggest pneumonia, nor new PE.  Given patient's complicated history and high risk for decompensation requiring resources not currently available at [facility], recommend consideration of transfer to [facility].   - EDP to discuss transplant with [facility] team - Continue DuoNebs every 6 hours - Continue high-dose Solu-Medrol  # Severe Pulmonary Hypertension # Right-sided Heart Failure  Most recent RHC in January 2024 demonstrated severe pulmonary hypertension, normal left-sided filling pressures, with reduced cardiac index.  CTA obtained today demonstrates reflux of contrast into hepatic veins concerning for worsening right heart function.  Patient has been on Lasix, but appears volume overloaded nonetheless.   - Patient will likely require advanced heart failure consultation - Consider CVP monitoring prior to initiation of diuresis  # Acute Kidney Injury # Hypokalemia  Unclear etiology, potentially due to poor p.o. intake with diuresis versus cardiorenal syndrome.  Further workup at [facility]  pending transfer.  Would avoid further IV fluids for now though.  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  No recent steroid use.   TRH will sign off at present, please call us again when needed.  ------------------------------------------------------------------------------------  HPI: Tammy Barrett is a 62 y.o. 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   Tammy Barrett states that she was in her usual state of health this morning.  She went to the bathroom as she had a nosebleed.  When she got there, she noted severe dizziness and generalized weakness and was able to lower herself to the ground.  She denies any chest pain, shortness of breath or palpitations at the time.  EMS was called by family members.  Per EMS, patient's SpO2 was in the 60% on arrival.  She was subsequently placed on nasal cannula with improvement to the 80s.  She was subsequently placed on nonrebreather with improvement to 94%.  Tammy Barrett denies any recent illness, nausea, vomiting, diarrhea, abdominal pain.  She notes that her chronic lower extremity swelling seems about the same and she is still taking her Lasix.  She recently stopped metolazone after made her feel unwell.  New medication changes include initiation of Plaquenil approximately 2 days ago due to her lupus rash.  ED course:  On arrival to the ED, patient was normotensive at 109/81 with heart  rate of 82.  She was saturating at 94% on 15 L nonrebreather.  She was afebrile at  98.2.Initial workup demonstrated normal CBC, and CMP with sodium of 135, potassium 2.6, glucose 144, BUN 51, creatinine 1.74 and GFR of 33.  BNP elevated 774.  Troponin elevated at 30 with flat trend at 35.  CTA was obtained that demonstrated evolution of previously noted left PE, but no new PE.  In addition, multichamber cardiomegaly with reflux of contrast into the hepatic veins, diffuse interstitial thickening.  Patient started on DuoNebs and high-dose Solu-Medrol.  TRH contacted for consideration of admission.  Review of Systems: As mentioned in the history of present illness. All other systems reviewed and are negative.  Past Medical History:  Diagnosis Date   COPD (chronic obstructive pulmonary disease) ([name])    Discoid lupus    Eczema    Esophageal dysmotility    Genital herpes    GERD (gastroesophageal reflux disease)    HSV (herpes simplex virus) infection    Hypertension    Lumbago    Lumbar disc disease    Peripheral neuropathy    Pulmonary fibrosis ([name])    Pulmonary hypertension ([name])    Raynaud's disease    Sclerodactyly    Scleroderma ([name])    Spinal stenosis    Past Surgical History:  Procedure Laterality Date   bone removal from pinkie toe     COLONOSCOPY WITH PROPOFOL N/A 08/30/2021   Procedure: COLONOSCOPY WITH PROPOFOL;  Surgeon: Wyline Mood, MD;  Location: [facility] ENDOSCOPY;  Service: Gastroenterology;  Laterality: N/A;   DILATION AND CURETTAGE OF UTERUS     PULMONARY THROMBECTOMY N/A 03/07/2022   Procedure: PULMONARY THROMBECTOMY;  Surgeon: Annice Needy, MD;  Location: [facility] INVASIVE CV LAB;  Service: Cardiovascular;  Laterality: N/A;   RIGHT HEART CATH Right 09/17/2018   Procedure: RIGHT HEART CATH;  Surgeon: Dalia Heading, MD;  Location: [facility] INVASIVE CV LAB;  Service: Cardiovascular;  Laterality: Right;   SPLENECTOMY     TUBAL LIGATION     Social History:  reports that she quit smoking about 12 years ago. Her smoking use included cigarettes. She smoked an  average of .5 packs per day. She has never used smokeless tobacco. She reports that she does not drink alcohol and does not use drugs.  Allergies  Allergen Reactions   Hydroxychloroquine     headaches   Meclizine     headaches    Family History  Problem Relation Age of Onset   Uterine cancer Mother        unknown age   Stroke Father    Breast cancer Neg Hx     Prior to Admission medications   Medication Sig Start Date End Date Taking? Authorizing Provider  amLODipine (NORVASC) 10 MG tablet Take 10 mg by mouth daily. 11/13/21   [provider]  apixaban (ELIQUIS) 5 MG TABS tablet Take 1 tablet (5 mg total) by mouth 2 (two) times daily. 03/24/22   Arnetha Courser, MD  feeding supplement (ENSURE ENLIVE / ENSURE PLUS) LIQD Take 237 mLs by mouth 3 (three) times daily between meals. 03/24/22   Arnetha Courser, MD  furosemide (LASIX) 40 MG tablet Take 1 tablet (40 mg total) by mouth 2 (two) times daily. 03/24/22   Arnetha Courser, MD  gabapentin (NEURONTIN) 600 MG tablet Take 600 mg by mouth 3 (three) times daily.     [provider]  mirtazapine (REMERON) 7.5 MG tablet Take 7.5 mg by mouth at bedtime.  [provider]  Multiple Vitamins-Minerals (MULTIVITAMIN WITH MINERALS) tablet Take 1 tablet by mouth daily.    [provider]  mycophenolate (CELLCEPT) 500 MG tablet Take 500 mg by mouth 2 (two) times daily.    [provider]  potassium chloride (KLOR-CON M) 10 MEQ tablet Take by mouth. 09/29/22 09/29/23  [provider]  tadalafil, PAH, (ADCIRCA) 20 MG tablet TAKE 1 TABLET (20 MG TOTAL) BY MOUTH ONCE DAILY 06/20/19   [provider]    Physical Exam: Vitals:   01/30/23 1225 01/30/23 1500 01/30/23 1630 01/30/23 1719  BP:  105/79 102/78   Pulse:  90 92   Resp:  15 16   Temp: 98.2 F (36.8 C)     TempSrc: Oral     SpO2:  98% 98% 94%  Weight:       Physical Exam Vitals and nursing note reviewed.  Constitutional:      General:  She is not in acute distress. HENT:     Head: Normocephalic and atraumatic.     Mouth/Throat:     Mouth: Mucous membranes are moist.     Pharynx: Oropharynx is clear.  Eyes:     Extraocular Movements: Extraocular movements intact.     Pupils: Pupils are equal, round, and reactive to light.  Neck:     Vascular: JVD (minimal) present.  Cardiovascular:     Rate and Rhythm: Normal rate and regular rhythm.     Heart sounds: Murmur heard.     Comments: +2 pitting edema of bilateral lower extremities extending up to the knees, +1 pitting edema of bilateral thighs Pulmonary:     Effort: Pulmonary effort is normal.     Breath sounds: Rales (diffuse fine crackles, predominantly in the lower lung fields.) present. No decreased breath sounds, wheezing or rhonchi.  Musculoskeletal:     Right lower leg: No tenderness.     Left lower leg: No tenderness.  Skin:    General: Skin is warm and dry.     Findings: Rash (Malar rash seen on face) present.     Comments: No skin thickening or tightness noted  Neurological:     General: No focal deficit present.     Mental Status: She is alert and oriented to person, place, and time.  Psychiatric:        Mood and Affect: Mood normal.        Behavior: Behavior normal.    Data Reviewed:  CBC with WBC of 4.5, hemoglobin 12.2, MCV of 99, and platelets 182 BMP with sodium of 135, potassium 2.6, bicarb 29, glucose 144, BUN 51, creatinine 1.74 with GFR of 33 Magnesium 1.8 BNP 774 Troponin 30 and then 35  EKG personally reviewed.  Sinus rhythm with rate of 81.  Notable P wave changes consistent with biatrial enlargement.  Right axis deviation.  CT Angio Chest PE W and/or Wo Contrast  Result Date: 01/30/2023 CLINICAL DATA:  Shortness of breath, syncope, and nosebleeds EXAM: CT ANGIOGRAPHY CHEST WITH CONTRAST TECHNIQUE: Multidetector CT imaging of the chest was performed using the standard protocol during bolus administration of intravenous contrast.  Multiplanar CT image reconstructions and MIPs were obtained to evaluate the vascular anatomy. RADIATION DOSE REDUCTION: This exam was performed according to the departmental dose-optimization program which includes automated exposure control, adjustment of the mA and/or kV according to patient size and/or use of iterative reconstruction technique. CONTRAST:  75mL OMNIPAQUE IOHEXOL 350 MG/ML SOLN COMPARISON:  Chest radiograph dated 01/30/2023, CTA chest dated  03/07/2022 FINDINGS: Cardiovascular: The study is high quality for the evaluation of pulmonary embolism. There are no new filling defects in the central, lobar, segmental or subsegmental pulmonary artery branches to suggest acute pulmonary embolism. Continued evolution of previously noted left pulmonary emboli, with a few residual eccentric foci containing chronic-appearing calcified plaque, for example main left pulmonary artery (5:99) and left lower lobar artery (5:111). Main pulmonary artery measures 3.8 cm, unchanged Multichamber cardiomegaly. Reflux of contrast material into the hepatic veins, suggesting a degree of right heart dysfunction. No significant pericardial fluid/thickening. Aortic atherosclerosis. Mediastinum/Nodes: Imaged thyroid gland without nodules meeting criteria for imaging follow-up by size. Patulous esophagus with 7 mm rounded radiodensity in the distal esophagus (4:102). 1.7 cm right hilar lymph node (4:51), unchanged. Lungs/Pleura: The central airways are patent. Severe upper lobe predominant centrilobular and paraseptal emphysema. Diffuse interstitial thickening and lower lobe predominant reticulations. No focal consolidation. No pneumothorax. No pleural effusion. Upper abdomen: Small volume free fluid. Musculoskeletal: No acute or abnormal lytic or blastic osseous lesions. Review of the MIP images confirms the above findings. IMPRESSION: 1. No evidence of acute pulmonary embolism. Continued evolution of previously noted left  pulmonary emboli, with a few residual eccentric foci containing chronic-appearing calcified plaque. 2. Multichamber cardiomegaly with reflux of contrast material into the hepatic veins, suggesting a degree of right heart dysfunction. 3. Diffuse interstitial thickening and lower lobe predominant reticulations, which may represent pulmonary edema or atypical infection superimposed on fibrotic changes. 4. Unchanged right hilar lymphadenopathy, likely reactive. 5. Patulous esophagus with 7 mm rounded radiodensity in the distal esophagus, which may represent ingested material. 6. Small volume free fluid in the upper abdomen. 7. Aortic Atherosclerosis (ICD10-I70.0) and Emphysema (ICD10-J43.9). Electronically Signed   By: Agustin Cree M.D.   On: 01/30/2023 14:48   DG Chest Port 1 View  Result Date: 01/30/2023 CLINICAL DATA:  Shortness of breath, syncope, nosebleed. EXAM: PORTABLE CHEST 1 VIEW COMPARISON:  Chest radiograph 03/10/2022. FINDINGS: The heart is enlarged, unchanged. The upper mediastinal contours are stable, allowing for rightward patient rotation. Coarsened interstitial markings throughout both lungs are unchanged consistent with extensive underlying emphysema as seen on prior CT. There is no convincing acute airspace opacity. There is no pulmonary edema. There is no pleural effusion or pneumothorax There is no acute osseous abnormality. IMPRESSION: Cardiomegaly and severe emphysema without evidence of acute cardiopulmonary pathology. Electronically Signed   By: Lesia Hausen M.D.   On: 01/30/2023 12:58    There are no new results to review at this time.   Family Communication: No family at bedside Primary team communication: Discussed with primary team  Thank you very much for involving [place] in the care of your patient.  Author: Verdene Lennert, MD 01/30/2023 5:52 PM  For on call review [email or web address].

## 2023-01-30 NOTE — ED Notes (Signed)
Pt on the list for [facility] to transport, no eta

## 2023-01-30 NOTE — ED Notes (Signed)
Per RT, 40 L/min HFNC, FiO2- 60%

## 2023-01-30 NOTE — ED Notes (Signed)
[facility]  transfer  center  called  per  Dr  Roxan Hockey  MD

## 2023-01-30 NOTE — ED Notes (Signed)
[facility]ray  [facility] 

## 2023-01-30 NOTE — ED Notes (Signed)
Pt accepted to [facility] bed [place], per [facility], Nurse, adult. Accepting is [employer]. Call report to [phone]

## 2023-01-30 NOTE — ED Notes (Signed)
Assumed care for patient at this time.

## 2023-01-30 NOTE — ED Triage Notes (Signed)
Pt to ED via [facility] from home for shortness of breath, syncopal episode, and nose bleed. Family reported that pt had a nose bleed and went to the bathroom had a syncopal episode. When fire arrived pts SpO2 was in the 60's. Pt was placed on Non-Rebreather. With SpO2 improving to 100%. When pt was placed back on nasal cannula her SpO2 dropped into the 80's. Pt arrives on 6 liter O2 via . Pts SpO2 maintained in the 80's and dropped as low as 80%. Pt was placed back on a Non-rebreather with SpO2 improving to 94% on 15 liters. Pt is alert at this time.

## 2023-01-30 NOTE — ED Provider Notes (Signed)
Of note, original plan was for admission to hospital service.  Upon completion of consultation hospitalist service has recommended the patient be transferred to [facility] due to continuity of care and complex city of care regarding her chronic lung disease.  Dr. Roxan Hockey my partner was given signout on the patient and plans to contact [facility] for transfer request.   Sharyn Creamer, MD 01/30/23 [phone]

## 2023-01-30 NOTE — ED Notes (Signed)
Called  [facility]  transfer  pt  waiting  on  bed

## 2023-01-30 NOTE — ED Provider Notes (Signed)
[facility] Provider Note    Event Date/Time   First MD Initiated Contact with Patient 01/30/23 1223     (approximate)   History   Near Syncope and Shortness of Breath   HPI  Tammy Barrett is a 62 y.o. female history of interstitial lung disease, pulmonary fibrosis, COPD, prior PE with thrombectomy  Patient reports she is taking her blood thinner as prescribed has not missed doses she last took it this morning.  She has noticed now for a few days that she has been feeling short of breath.   Today she called EMS after feeling short of breath.  Evidently she also had a nosebleed that is now resolved.  She reports that she recalls feeling weak and going to the floor and did not strike her head or become injured.  However she has been experiencing a feeling of shortness of breath with any attempted exertion for several days now.  She is also noticed she is swelling in her feet.  She has no chest pain.  She relates that she uses 6 L of oxygen at baseline.  No wheezing cough or fever.    Physical Exam   Triage Vital Signs: ED Triage Vitals [01/30/23 1217]  Enc Vitals Group     BP 109/81     Pulse Rate 84     Resp 20     Temp      Temp src      SpO2 94 %     Weight 147 lb 14.9 oz (67.1 kg)     Height      Head Circumference      Peak Flow      Pain Score 0     Pain Loc      Pain Edu?      Excl. in GC?     Most recent vital signs: Vitals:   01/30/23 1225 01/30/23 1500  BP:  105/79  Pulse:  90  Resp:  15  Temp: 98.2 F (36.8 C)   SpO2:  98%     General: Awake, no distress.  Her work of breathing is normal but she does have a significant oxygen requirement currently on nonrebreather.  Attempt to wean off to nasal cannula unsuccessful.  Her work of breathing however does appear normal at this time CV:  Good peripheral perfusion.  Normal tones and rate Resp:  Normal effort.  She has dry crackles throughout.  No noted wheezing. Abd:  No  distention.  Other:     ED Results / Procedures / Treatments   Labs (all labs ordered are listed, but only abnormal results are displayed) Labs Reviewed  BASIC METABOLIC PANEL - Abnormal; Notable for the following components:      Result Value   Potassium 2.6 (*)    Chloride 94 (*)    Glucose, Bld 144 (*)    BUN 51 (*)    Creatinine, Ser 1.74 (*)    GFR, Estimated 33 (*)    All other components within normal limits  CBC - Abnormal; Notable for the following components:   RBC 3.82 (*)    RDW 17.3 (*)    All other components within normal limits  BRAIN NATRIURETIC PEPTIDE - Abnormal; Notable for the following components:   B Natriuretic Peptide 774.5 (*)    All other components within normal limits  TROPONIN I (HIGH SENSITIVITY) - Abnormal; Notable for the following components:   Troponin I (High Sensitivity) 30 (*)  All other components within normal limits  MAGNESIUM  URINALYSIS, ROUTINE W REFLEX MICROSCOPIC  CBG MONITORING, ED  TROPONIN I (HIGH SENSITIVITY)     EKG  ED interpreted by me at 1720 heart rate 80 QRS 120 QTc 480 Sinus rhythm, incomplete right bundle branch block.  No obvious frank ischemia   RADIOLOGY Chest x-ray interpreted me as chronic interstitial abnormality but no obvious acute finding.  Cardiomegaly    CT Angio Chest PE W and/or Wo Contrast  Result Date: 01/30/2023 CLINICAL DATA:  Shortness of breath, syncope, and nosebleeds EXAM: CT ANGIOGRAPHY CHEST WITH CONTRAST TECHNIQUE: Multidetector CT imaging of the chest was performed using the standard protocol during bolus administration of intravenous contrast. Multiplanar CT image reconstructions and MIPs were obtained to evaluate the vascular anatomy. RADIATION DOSE REDUCTION: This exam was performed according to the departmental dose-optimization program which includes automated exposure control, adjustment of the mA and/or kV according to patient size and/or use of iterative reconstruction  technique. CONTRAST:  75mL OMNIPAQUE IOHEXOL 350 MG/ML SOLN COMPARISON:  Chest radiograph dated 01/30/2023, CTA chest dated 03/07/2022 FINDINGS: Cardiovascular: The study is high quality for the evaluation of pulmonary embolism. There are no new filling defects in the central, lobar, segmental or subsegmental pulmonary artery branches to suggest acute pulmonary embolism. Continued evolution of previously noted left pulmonary emboli, with a few residual eccentric foci containing chronic-appearing calcified plaque, for example main left pulmonary artery (5:99) and left lower lobar artery (5:111). Main pulmonary artery measures 3.8 cm, unchanged Multichamber cardiomegaly. Reflux of contrast material into the hepatic veins, suggesting a degree of right heart dysfunction. No significant pericardial fluid/thickening. Aortic atherosclerosis. Mediastinum/Nodes: Imaged thyroid gland without nodules meeting criteria for imaging follow-up by size. Patulous esophagus with 7 mm rounded radiodensity in the distal esophagus (4:102). 1.7 cm right hilar lymph node (4:51), unchanged. Lungs/Pleura: The central airways are patent. Severe upper lobe predominant centrilobular and paraseptal emphysema. Diffuse interstitial thickening and lower lobe predominant reticulations. No focal consolidation. No pneumothorax. No pleural effusion. Upper abdomen: Small volume free fluid. Musculoskeletal: No acute or abnormal lytic or blastic osseous lesions. Review of the MIP images confirms the above findings. IMPRESSION: 1. No evidence of acute pulmonary embolism. Continued evolution of previously noted left pulmonary emboli, with a few residual eccentric foci containing chronic-appearing calcified plaque. 2. Multichamber cardiomegaly with reflux of contrast material into the hepatic veins, suggesting a degree of right heart dysfunction. 3. Diffuse interstitial thickening and lower lobe predominant reticulations, which may represent pulmonary edema  or atypical infection superimposed on fibrotic changes. 4. Unchanged right hilar lymphadenopathy, likely reactive. 5. Patulous esophagus with 7 mm rounded radiodensity in the distal esophagus, which may represent ingested material. 6. Small volume free fluid in the upper abdomen. 7. Aortic Atherosclerosis (ICD10-I70.0) and Emphysema (ICD10-J43.9). Electronically Signed   By: Agustin Cree M.D.   On: 01/30/2023 14:48     PROCEDURES:  Critical Care performed: Yes, see critical care procedure note(s)  CRITICAL CARE Performed by: Sharyn Creamer   Total critical care time: 35 minutes  Critical care time was exclusive of separately billable procedures and treating other patients.  Critical care was necessary to treat or prevent imminent or life-threatening deterioration.  Critical care was time spent personally by me on the following activities: development of treatment plan with patient and/or surrogate as well as nursing, discussions with consultants, evaluation of patient's response to treatment, examination of patient, obtaining history from patient or surrogate, ordering and performing treatments and interventions, ordering and review  of laboratory studies, ordering and review of radiographic studies, pulse oximetry and re-evaluation of patient's condition.   Procedures   MEDICATIONS ORDERED IN ED: Medications  ipratropium-albuterol (DUONEB) 0.5-2.5 (3) MG/3ML nebulizer solution 3 mL (3 mLs Nebulization Given 01/30/23 1319)  ipratropium-albuterol (DUONEB) 0.5-2.5 (3) MG/3ML nebulizer solution 3 mL (3 mLs Nebulization Given 01/30/23 1319)  methylPREDNISolone sodium succinate (SOLU-MEDROL) 125 mg/2 mL injection 125 mg (125 mg Intravenous Given 01/30/23 1323)  sodium chloride 0.9 % bolus 500 mL (500 mLs Intravenous New Bag/Given 01/30/23 1328)  potassium chloride 10 mEq in 100 mL IVPB (10 mEq Intravenous New Bag/Given 01/30/23 1452)  iohexol (OMNIPAQUE) 350 MG/ML injection 75 mL (75 mLs Intravenous Contrast  Given 01/30/23 1428)     IMPRESSION / MDM / ASSESSMENT AND PLAN / ED COURSE  I reviewed the triage vital signs and the nursing notes.                              Differential diagnosis includes, but is not limited to, severe acute hypoxic respiratory failure, PE, less likely ACS, possible volume overload with bilateral lower extremity pitting edema, exacerbation of underlying pulmonary disease or fibrosis etc.  Differential diagnosis quite broad.  At this point her work of breathing is quite comfortable but she has obvious oxygen deficit.  I think hide her priority list is to evaluate even in the setting of active anticoagulation, but she does not have a large recurrent pulmonary embolism.  She reports a near syncopal episode but did not strike her head fall or become injured.  She is alert well-oriented normocephalic atraumatic.  Awaiting further workup at this time.  Will start nebulizer treatment and steroid as well as we continue her workup and treatment    Patient's presentation is most consistent with acute presentation with potential threat to life or bodily function.   The patient is on the cardiac monitor to evaluate for evidence of arrhythmia and/or significant heart rate changes.  Labs notable for hypokalemia, AKI with creatinine 1.7.  BNP elevated troponin minimally elevated.  ----------------------------------------- 4:47 PM on 01/30/2023 ----------------------------------------- Patient's work of breathing quite comfortable at this time.  I have switched to heated high flow nasal cannula which patient is tolerating well.  Patient accepted admission by Dr. Huel Cote     FINAL CLINICAL IMPRESSION(S) / ED DIAGNOSES   Final diagnoses:  Acute hypoxemic respiratory failure ([name])  Near syncope  Hypokalemia  AKI (acute kidney injury) ([name])     Rx / DC Orders   ED Discharge Orders     None        Note:  This document was prepared using Dragon voice recognition  software and may include unintentional dictation errors.   Sharyn Creamer, MD 01/30/23 [phone]

## 2023-01-31 DIAGNOSIS — M349 Systemic sclerosis, unspecified: Secondary | ICD-10-CM | POA: Diagnosis present

## 2023-08-19 IMAGING — MR MR LUMBAR SPINE W/O CM
5 series · 30 of 48 positions shown · non-contrast
Comparison: MRI lumbar spine 05/14/2013

CLINICAL DATA: Lower back pain with bilateral leg pain
(left-greater-than-right) for 5-6 months.

EXAM:
MRI LUMBAR SPINE WITHOUT CONTRAST
TECHNIQUE: Multiplanar, multisequence MR imaging of the lumbar spine was
performed. No intravenous contrast was administered.

[Series 5: T2 · sagittal · 4.0mm · 0.81mm/px · 6 of 17 slices shown (1 of 2)]
[im 1/17]
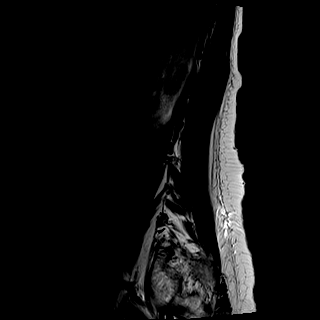
[im 4/17]
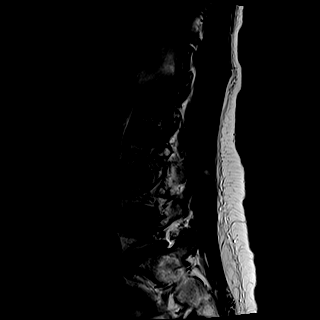
[im 7/17]
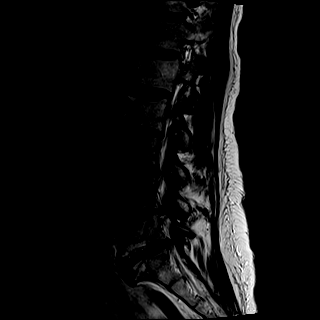
[im 10/17]
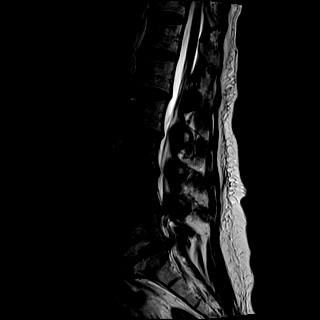
[im 13/17]
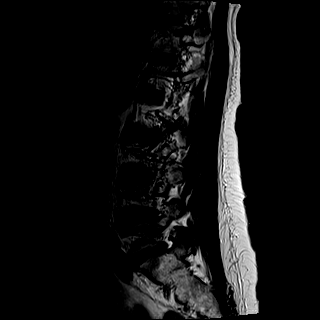
[im 17/17]
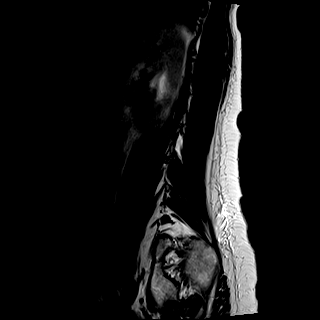

[Series 6: T1 · sagittal · 4.0mm · 0.81mm/px · 7 of 17 slices shown (1 of 2)]
[im 1/17]
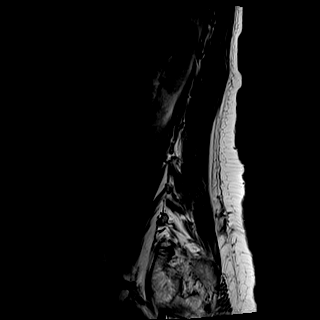
[im 3/17]
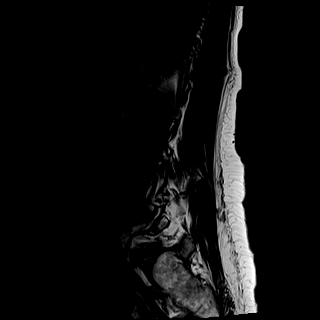
[im 6/17]
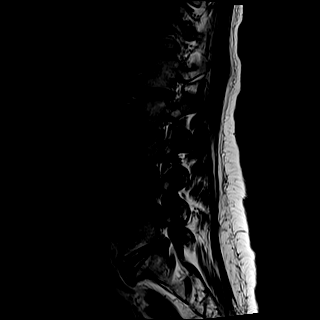
[im 9/17]
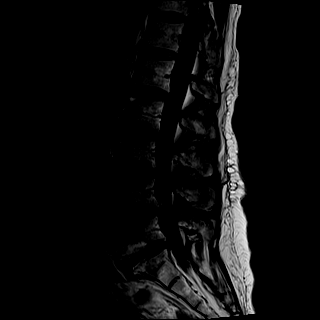
[im 11/17]
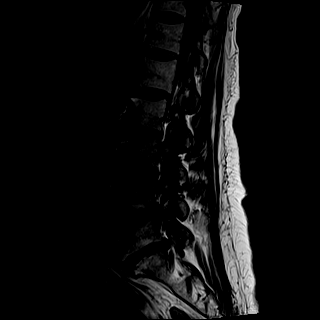
[im 14/17]
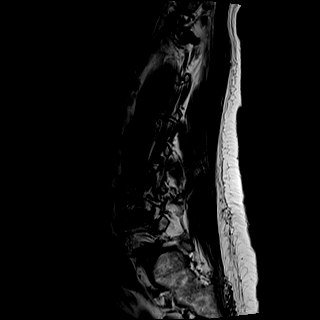
[im 17/17]
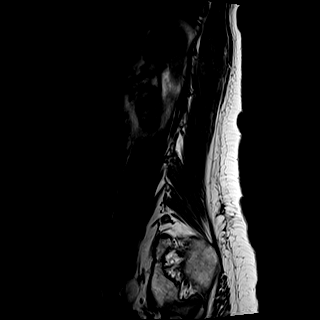

[Series 7: STIR · sagittal · 4.0mm · 0.41mm/px · 1 of 17 slices shown]
[im 1/17]
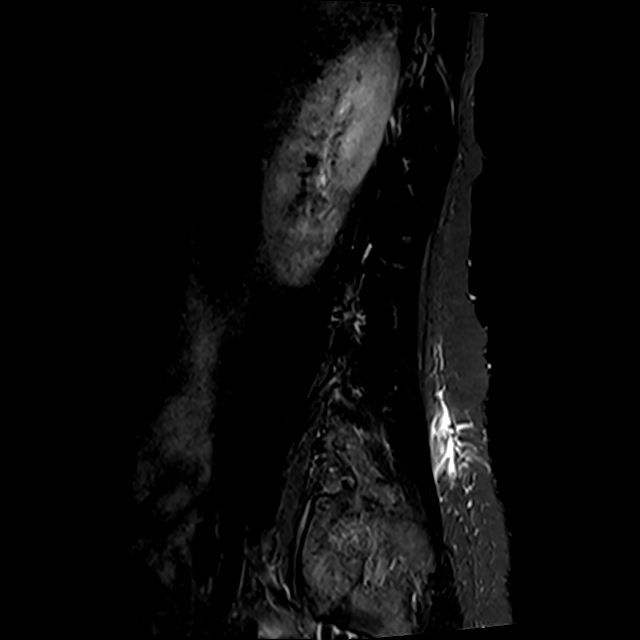

[Series 8: T2 · axial · 4.0mm · 0.78mm/px · z∈[-112,+97]mm · 8 of 37 slices shown (2 of 2)]
[im 1/37]
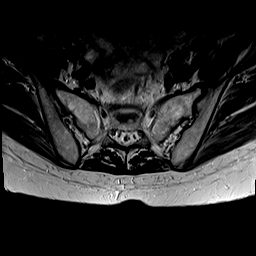
[im 6/37]
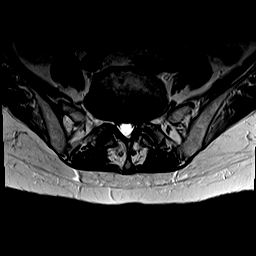
[im 12/37]
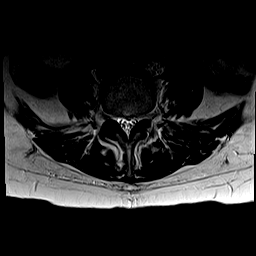
[im 17/37]
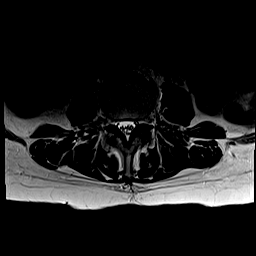
[im 20/37]
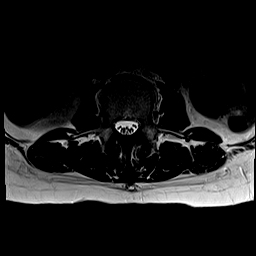
[im 25/37]
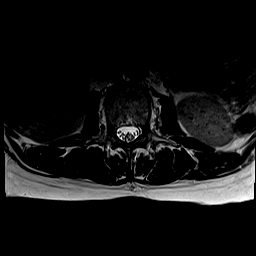
[im 31/37]
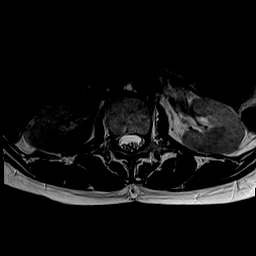
[im 37/37]
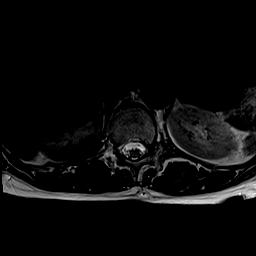

[Series 9: T1 · axial · 4.0mm · 0.39mm/px · z∈[-112,+97]mm · 8 of 37 slices shown (2 of 2)]
[im 1/37]
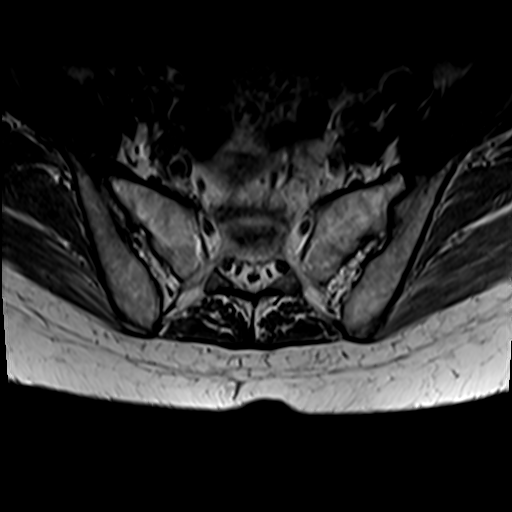
[im 6/37]
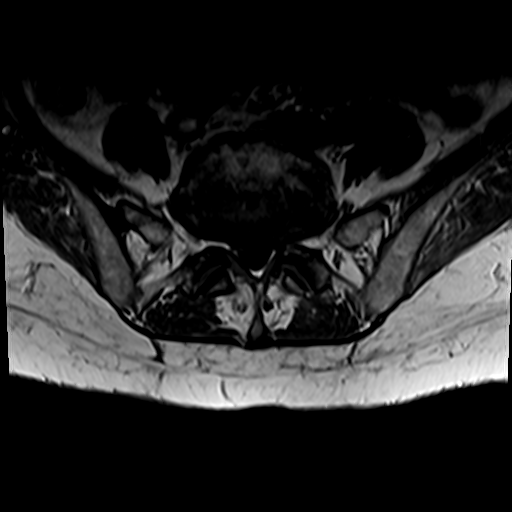
[im 12/37]
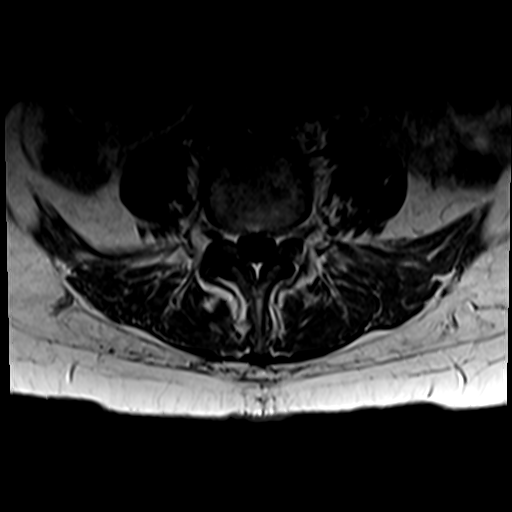
[im 17/37]
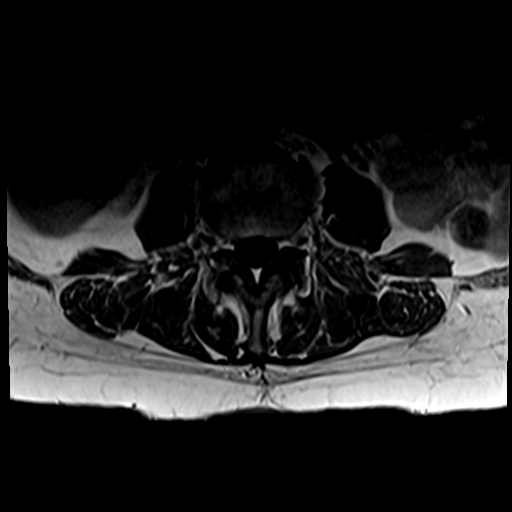
[im 20/37]
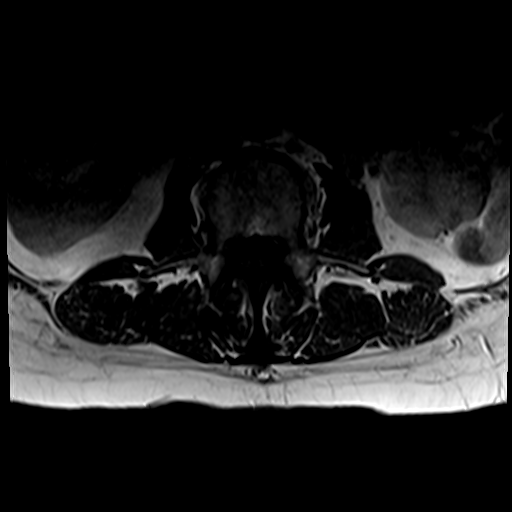
[im 25/37]
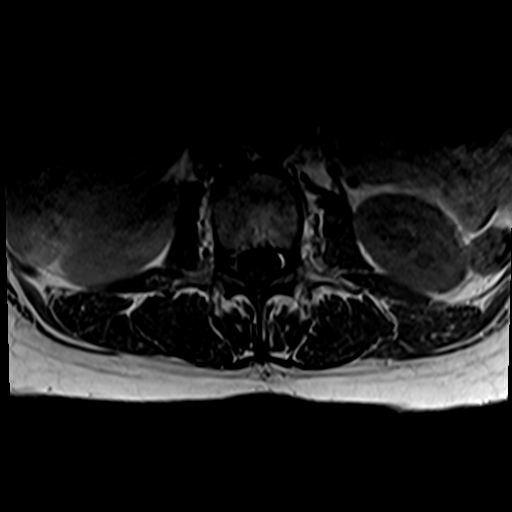
[im 31/37]
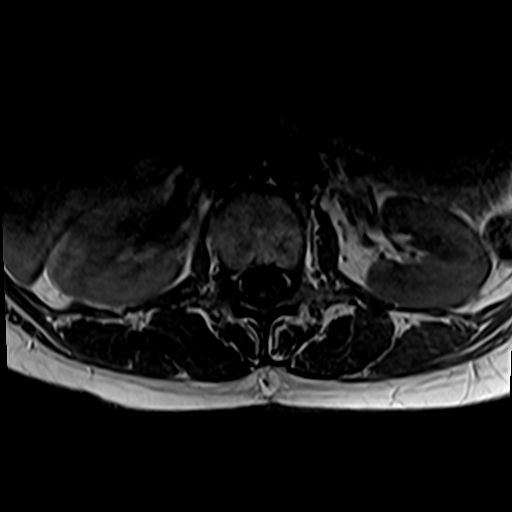
[im 37/37]
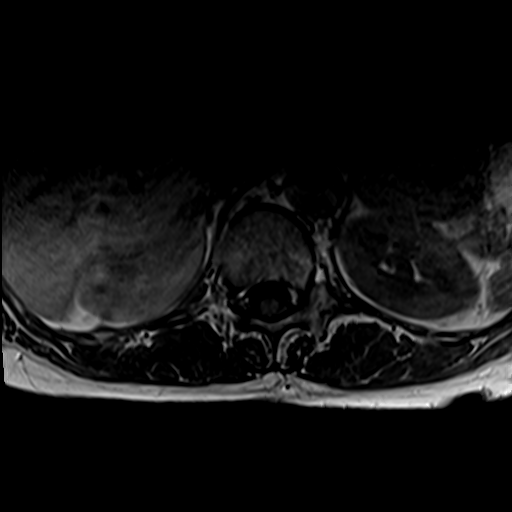

[30 of 48 positions shown; findings below may reference images not displayed]

FINDINGS: Segmentation:  Standard.

Alignment: Minimal 2 mm retrolisthesis of L2 on L3 and 2 mm
retrolisthesis of L1 on L2, similar to prior.

Vertebrae: Vertebral body heights are maintained. Mild superior L4
endplate degenerative Schmorl's node concavity, similar to prior.
Moderate anterior and mild posterior L5-S1 mild posterior greater
than anterior L4-5, and minimal posterior L3-4 disc space narrowing,
mildly progressed from 05/14/2013. Mild anterior superior L5
endplate marrow edema. Moderate anterior L5-S1 chronic fat intensity
marrow endplate degenerative change. Minimal anterior superior L3 on
L2 chronic fat intensity marrow endplate change.

Conus medullaris and cauda equina: Conus extends to the mid to
superior L1 level. Conus and cauda equina appear normal.

Paraspinal and other soft tissues: Limited images of the
retroperitoneum are unremarkable. Anterior superior
left-greater-than-right sacroiliac joint degenerative osteophytosis.

Disc levels:

L1-2: Mild bilateral facet joint hypertrophy. Minimal posterior disc
bulge. No significant central canal or neuroforaminal stenosis.

L2-3: Mild-to-moderate facet joint hypertrophy. Mild-to-moderate
broad-based posterior disc bulge with mild left-greater-than-right
intraforaminal extension, mildly increased from prior. Mild
narrowing of the lateral recesses and mild central canal stenosis
mildly worsened from prior. Mild left and minimal right
neuroforaminal narrowing, mildly worsened from prior.

L3-4: Mild bilateral facet joint hypertrophy. Mild broad-based
posterior disc osteophyte complex with mild extension to the
bilateral neural foramina. Mild narrowing of the lateral recesses
and mild central canal stenosis. Very mild bilateral neuroforaminal
narrowing. No significant change.

L4-5: Moderate bilateral facet joint and ligamentum flavum
hypertrophy. Interval worsening in moderate to high-grade
broad-based posterior disc bulge measuring up to 8 mm in AP
dimension and contributing to severe narrowing of the lateral
recesses and severe central canal stenosis that is worsened from
prior. Mild bilateral neuroforaminal stenosis is mildly worsened
from prior.

L5-S1: Mild-to-moderate bilateral facet joint hypertrophy. Moderate
broad-based posterior disc osteophyte complex with bilateral
intraforaminal extension, similar to prior. Mild-to-moderate
narrowing of the lateral recesses and mild central canal stenosis,
unchanged. Mild bilateral neuroforaminal stenosis, unchanged.
IMPRESSION: Compared to 05/14/2013:

1. Multilevel degenerative disc and joint changes as above, mildly
worsened at some levels compared to prior.
2. L2-3 mild lateral recess and mild central canal stenosis is
mildly worsened from prior. Mildly worsened mild left and minimal
right neuroforaminal narrowing.
3. L4-5 worsened severe lateral recess and severe central canal
stenosis. Mildly worsened mild bilateral neuroforaminal stenosis.
4. L5-S1 unchanged mild-to-moderate narrowing of the lateral
recesses, mild central canal stenosis, and mild bilateral
neuroforaminal stenosis.

## 2023-09-28 ENCOUNTER — Emergency Department

## 2023-09-28 ENCOUNTER — Other Ambulatory Visit: Payer: Self-pay

## 2023-09-28 ENCOUNTER — Inpatient Hospital Stay
Admission: EM | Admit: 2023-09-28 | Discharge: 2023-09-29 | DRG: 196 | Disposition: A | Discharge status: Hospice | Attending: Internal Medicine | Admitting: Internal Medicine

## 2023-09-28 ENCOUNTER — Inpatient Hospital Stay

## 2023-09-28 ENCOUNTER — Encounter: Payer: Self-pay | Admitting: Internal Medicine

## 2023-09-28 DIAGNOSIS — Z87891 Personal history of nicotine dependence: Secondary | ICD-10-CM | POA: Diagnosis not present

## 2023-09-28 DIAGNOSIS — K219 Gastro-esophageal reflux disease without esophagitis: Secondary | ICD-10-CM | POA: Diagnosis present

## 2023-09-28 DIAGNOSIS — N1832 Chronic kidney disease, stage 3b: Secondary | ICD-10-CM | POA: Diagnosis not present

## 2023-09-28 DIAGNOSIS — R32 Unspecified urinary incontinence: Secondary | ICD-10-CM | POA: Diagnosis present

## 2023-09-28 DIAGNOSIS — J8410 Pulmonary fibrosis, unspecified: Principal | ICD-10-CM

## 2023-09-28 DIAGNOSIS — G629 Polyneuropathy, unspecified: Secondary | ICD-10-CM | POA: Diagnosis present

## 2023-09-28 DIAGNOSIS — I130 Hypertensive heart and chronic kidney disease with heart failure and stage 1 through stage 4 chronic kidney disease, or unspecified chronic kidney disease: Secondary | ICD-10-CM | POA: Diagnosis present

## 2023-09-28 DIAGNOSIS — I071 Rheumatic tricuspid insufficiency: Secondary | ICD-10-CM | POA: Diagnosis present

## 2023-09-28 DIAGNOSIS — Z86718 Personal history of other venous thrombosis and embolism: Secondary | ICD-10-CM

## 2023-09-28 DIAGNOSIS — Z823 Family history of stroke: Secondary | ICD-10-CM

## 2023-09-28 DIAGNOSIS — M351 Other overlap syndromes: Secondary | ICD-10-CM | POA: Diagnosis not present

## 2023-09-28 DIAGNOSIS — Z7982 Long term (current) use of aspirin: Secondary | ICD-10-CM

## 2023-09-28 DIAGNOSIS — E872 Acidosis, unspecified: Secondary | ICD-10-CM | POA: Diagnosis not present

## 2023-09-28 DIAGNOSIS — Z9981 Dependence on supplemental oxygen: Secondary | ICD-10-CM

## 2023-09-28 DIAGNOSIS — I5032 Chronic diastolic (congestive) heart failure: Secondary | ICD-10-CM

## 2023-09-28 DIAGNOSIS — Z9081 Acquired absence of spleen: Secondary | ICD-10-CM

## 2023-09-28 DIAGNOSIS — I5082 Biventricular heart failure: Secondary | ICD-10-CM | POA: Diagnosis present

## 2023-09-28 DIAGNOSIS — Z7951 Long term (current) use of inhaled steroids: Secondary | ICD-10-CM

## 2023-09-28 DIAGNOSIS — M349 Systemic sclerosis, unspecified: Secondary | ICD-10-CM | POA: Diagnosis not present

## 2023-09-28 DIAGNOSIS — G9341 Metabolic encephalopathy: Secondary | ICD-10-CM | POA: Diagnosis not present

## 2023-09-28 DIAGNOSIS — J439 Emphysema, unspecified: Secondary | ICD-10-CM | POA: Diagnosis present

## 2023-09-28 DIAGNOSIS — Z1152 Encounter for screening for COVID-19: Secondary | ICD-10-CM

## 2023-09-28 DIAGNOSIS — I272 Pulmonary hypertension, unspecified: Secondary | ICD-10-CM

## 2023-09-28 DIAGNOSIS — Z79899 Other long term (current) drug therapy: Secondary | ICD-10-CM

## 2023-09-28 DIAGNOSIS — Z515 Encounter for palliative care: Secondary | ICD-10-CM

## 2023-09-28 DIAGNOSIS — Z888 Allergy status to other drugs, medicaments and biological substances status: Secondary | ICD-10-CM

## 2023-09-28 DIAGNOSIS — I7300 Raynaud's syndrome without gangrene: Secondary | ICD-10-CM | POA: Diagnosis present

## 2023-09-28 DIAGNOSIS — Z8049 Family history of malignant neoplasm of other genital organs: Secondary | ICD-10-CM

## 2023-09-28 DIAGNOSIS — J9621 Acute and chronic respiratory failure with hypoxia: Secondary | ICD-10-CM | POA: Diagnosis not present

## 2023-09-28 DIAGNOSIS — I50811 Acute right heart failure: Secondary | ICD-10-CM | POA: Diagnosis present

## 2023-09-28 DIAGNOSIS — Z86711 Personal history of pulmonary embolism: Secondary | ICD-10-CM

## 2023-09-28 DIAGNOSIS — Z7901 Long term (current) use of anticoagulants: Secondary | ICD-10-CM | POA: Diagnosis not present

## 2023-09-28 DIAGNOSIS — J9601 Acute respiratory failure with hypoxia: Principal | ICD-10-CM | POA: Diagnosis present

## 2023-09-28 DIAGNOSIS — Z79624 Long term (current) use of inhibitors of nucleotide synthesis: Secondary | ICD-10-CM

## 2023-09-28 DIAGNOSIS — I5030 Unspecified diastolic (congestive) heart failure: Secondary | ICD-10-CM | POA: Diagnosis present

## 2023-09-28 LAB — CBC WITH DIFFERENTIAL/PLATELET
Abs Immature Granulocytes: 0.01 10*3/uL (ref 0.00–0.07)
Basophils Absolute: 0 10*3/uL (ref 0.0–0.1)
Basophils Relative: 1 %
Eosinophils Absolute: 0.3 10*3/uL (ref 0.0–0.5)
Eosinophils Relative: 6 %
HCT: 43.5 % (ref 36.0–46.0)
Hemoglobin: 13.8 g/dL (ref 12.0–15.0)
Immature Granulocytes: 0 %
Lymphocytes Relative: 31 %
Lymphs Abs: 1.3 10*3/uL (ref 0.7–4.0)
MCH: 32.6 pg (ref 26.0–34.0)
MCHC: 31.7 g/dL (ref 30.0–36.0)
MCV: 102.8 fL — ABNORMAL HIGH (ref 80.0–100.0)
Monocytes Absolute: 0.1 10*3/uL (ref 0.1–1.0)
Monocytes Relative: 3 %
Neutro Abs: 2.6 10*3/uL (ref 1.7–7.7)
Neutrophils Relative %: 59 %
Platelets: 157 10*3/uL (ref 150–400)
RBC: 4.23 MIL/uL (ref 3.87–5.11)
RDW: 14.7 % (ref 11.5–15.5)
WBC: 4.3 10*3/uL (ref 4.0–10.5)
nRBC: 0 % (ref 0.0–0.2)

## 2023-09-28 LAB — RESP PANEL BY RT-PCR (RSV, FLU A&B, COVID)  RVPGX2
Influenza A by PCR: NEGATIVE
Influenza B by PCR: NEGATIVE
Resp Syncytial Virus by PCR: NEGATIVE
SARS Coronavirus 2 by RT PCR: NEGATIVE

## 2023-09-28 LAB — URINALYSIS, ROUTINE W REFLEX MICROSCOPIC
Bacteria, UA: NONE SEEN
Bilirubin Urine: NEGATIVE
Glucose, UA: NEGATIVE mg/dL
Ketones, ur: NEGATIVE mg/dL
Leukocytes,Ua: NEGATIVE
Nitrite: NEGATIVE
Protein, ur: NEGATIVE mg/dL
Specific Gravity, Urine: 1.008 (ref 1.005–1.030)
Squamous Epithelial / HPF: 0 /HPF (ref 0–5)
pH: 6 (ref 5.0–8.0)

## 2023-09-28 LAB — COMPREHENSIVE METABOLIC PANEL
ALT: 19 U/L (ref 0–44)
AST: 36 U/L (ref 15–41)
Albumin: 4 g/dL (ref 3.5–5.0)
Alkaline Phosphatase: 170 U/L — ABNORMAL HIGH (ref 38–126)
Anion gap: 17 — ABNORMAL HIGH (ref 5–15)
BUN: 64 mg/dL — ABNORMAL HIGH (ref 8–23)
CO2: 27 mmol/L (ref 22–32)
Calcium: 9.7 mg/dL (ref 8.9–10.3)
Chloride: 90 mmol/L — ABNORMAL LOW (ref 98–111)
Creatinine, Ser: 2 mg/dL — ABNORMAL HIGH (ref 0.44–1.00)
GFR, Estimated: 28 mL/min — ABNORMAL LOW (ref >60)
Glucose, Bld: 181 mg/dL — ABNORMAL HIGH (ref 70–99)
Potassium: 3.5 mmol/L (ref 3.5–5.1)
Sodium: 134 mmol/L — ABNORMAL LOW (ref 135–145)
Total Bilirubin: 1.2 mg/dL (ref 0.0–1.2)
Total Protein: 8 g/dL (ref 6.5–8.1)

## 2023-09-28 LAB — TROPONIN I (HIGH SENSITIVITY)
Troponin I (High Sensitivity): 32 ng/L — ABNORMAL HIGH (ref <18)
Troponin I (High Sensitivity): 52 ng/L — ABNORMAL HIGH (ref <18)

## 2023-09-28 LAB — PROCALCITONIN: Procalcitonin: 0.18 ng/mL

## 2023-09-28 LAB — HEPARIN LEVEL (UNFRACTIONATED)
Heparin Unfractionated: <0.1 [IU]/mL — ABNORMAL LOW (ref 0.30–0.70)
Heparin Unfractionated: 0.41 [IU]/mL (ref 0.30–0.70)

## 2023-09-28 LAB — PROTIME-INR
INR: 1.1 (ref 0.8–1.2)
Prothrombin Time: 14.1 s (ref 11.4–15.2)

## 2023-09-28 LAB — D-DIMER, QUANTITATIVE: D-Dimer, Quant: 1.68 ug{FEU}/mL — ABNORMAL HIGH (ref 0.00–0.50)

## 2023-09-28 LAB — APTT
aPTT: 25 s (ref 24–36)
aPTT: 83 s — ABNORMAL HIGH (ref 24–36)

## 2023-09-28 LAB — LACTIC ACID, PLASMA
Lactic Acid, Venous: 4.6 mmol/L (ref 0.5–1.9)
Lactic Acid, Venous: 1.3 mmol/L (ref 0.5–1.9)

## 2023-09-28 LAB — BRAIN NATRIURETIC PEPTIDE: B Natriuretic Peptide: 859.3 pg/mL — ABNORMAL HIGH (ref 0.0–100.0)

## 2023-09-28 LAB — HIV ANTIBODY (ROUTINE TESTING W REFLEX): HIV Screen 4th Generation wRfx: NONREACTIVE

## 2023-09-28 MED ORDER — ACETAMINOPHEN 650 MG RE SUPP: 650.0000 mg | Freq: Four times a day (QID) | RECTAL | Status: DC | PRN

## 2023-09-28 MED ORDER — LORAZEPAM 0.5 MG PO TABS: 0.5000 mg | ORAL_TABLET | Freq: Four times a day (QID) | ORAL | Status: DC | PRN

## 2023-09-28 MED ORDER — ONDANSETRON HCL 4 MG/2ML IJ SOLN: 4.0000 mg | Freq: Four times a day (QID) | INTRAMUSCULAR | Status: DC | PRN

## 2023-09-28 MED ORDER — GABAPENTIN 300 MG PO CAPS
300.0000 mg | ORAL_CAPSULE | Freq: Three times a day (TID) | ORAL | Status: DC
  Administered 2023-09-28 – 2023-09-29 (×3): 300 mg via ORAL
  Filled 2023-09-28 (×3): qty 1

## 2023-09-28 MED ORDER — MYCOPHENOLATE MOFETIL 250 MG PO CAPS
500.0000 mg | ORAL_CAPSULE | Freq: Two times a day (BID) | ORAL | Status: DC
  Administered 2023-09-28 – 2023-09-29 (×3): 500 mg via ORAL
  Filled 2023-09-28 (×4): qty 2

## 2023-09-28 MED ORDER — FUROSEMIDE 10 MG/ML IJ SOLN
20.0000 mg | Freq: Two times a day (BID) | INTRAMUSCULAR | Status: DC
Start: 2023-09-28 — End: 2023-09-28
  Administered 2023-09-28: 20 mg via INTRAVENOUS
  Filled 2023-09-28: qty 4

## 2023-09-28 MED ORDER — ACETAMINOPHEN 325 MG PO TABS: 650.0000 mg | ORAL_TABLET | Freq: Four times a day (QID) | ORAL | Status: DC | PRN

## 2023-09-28 MED ORDER — HEPARIN BOLUS VIA INFUSION
3600.0000 [IU] | Freq: Once | INTRAVENOUS | Status: AC
  Administered 2023-09-28: 3600 [IU] via INTRAVENOUS
  Filled 2023-09-28: qty 3600

## 2023-09-28 MED ORDER — MIRTAZAPINE 15 MG PO TABS
7.5000 mg | ORAL_TABLET | Freq: Every day | ORAL | Status: DC
  Administered 2023-09-28: 7.5 mg via ORAL
  Filled 2023-09-28: qty 1

## 2023-09-28 MED ORDER — PANTOPRAZOLE SODIUM 40 MG PO TBEC
40.0000 mg | DELAYED_RELEASE_TABLET | Freq: Every day | ORAL | Status: DC
  Administered 2023-09-28 – 2023-09-29 (×2): 40 mg via ORAL
  Filled 2023-09-28 (×2): qty 1

## 2023-09-28 MED ORDER — TECHNETIUM TO 99M ALBUMIN AGGREGATED
4.3400 | Freq: Once | INTRAVENOUS | Status: AC | PRN
  Administered 2023-09-28: 4.34 via INTRAVENOUS

## 2023-09-28 MED ORDER — ENSURE ENLIVE PO LIQD
237.0000 mL | Freq: Three times a day (TID) | ORAL | Status: DC
  Administered 2023-09-28 – 2023-09-29 (×2): 237 mL via ORAL

## 2023-09-28 MED ORDER — ONDANSETRON HCL 4 MG PO TABS: 4.0000 mg | ORAL_TABLET | Freq: Four times a day (QID) | ORAL | Status: DC | PRN

## 2023-09-28 MED ORDER — HYDROXYCHLOROQUINE SULFATE 200 MG PO TABS
200.0000 mg | ORAL_TABLET | Freq: Every day | ORAL | Status: DC
  Administered 2023-09-28 – 2023-09-29 (×2): 200 mg via ORAL
  Filled 2023-09-28 (×2): qty 1

## 2023-09-28 MED ORDER — HYDROCODONE BIT-HOMATROP MBR 5-1.5 MG/5ML PO SOLN: 5.0000 mL | Freq: Four times a day (QID) | ORAL | Status: DC | PRN

## 2023-09-28 MED ORDER — HEPARIN (PORCINE) 25000 UT/250ML-% IV SOLN
1050.0000 [IU]/h | INTRAVENOUS | Status: DC
  Administered 2023-09-28 – 2023-09-29 (×2): 1050 [IU]/h via INTRAVENOUS
  Filled 2023-09-28 (×2): qty 250

## 2023-09-28 MED ORDER — FUROSEMIDE 10 MG/ML IJ SOLN
40.0000 mg | Freq: Once | INTRAMUSCULAR | Status: AC
  Administered 2023-09-28: 40 mg via INTRAVENOUS
  Filled 2023-09-28: qty 4

## 2023-09-28 NOTE — Assessment & Plan Note (Addendum)
 Worsening creatinine at 2, it was 1.78-month ago. Patient did receive Lasix and clinically appears dry. -Holding further IV diuresis -Monitor renal function -Avoid nephrotoxins

## 2023-09-28 NOTE — Progress Notes (Signed)
 Progress Note   Patient: Tammy Barrett ZOX:[id number] DOB: 13-Dec-1960 DOA: 09/28/2023     0 DOS: the patient was seen and examined on 09/28/2023   Brief hospital course: Taken from H&P.  Tammy Barrett is a 63 y.o. female with medical history significant for chronic respiratory failure on 8L O2 severe pulmonary hypertension, emphysema, interstitial lung disease, chronic HFpEF, severe TR, severe LVH, mixed connective tissue disorder, CKD stage 3, scleroderma, prior PE s/p thrombectomy on Eliquis (discontinued a few days ago due to nosebleeds), last hospitalized at [facility] from 7/5 to 02/06/2023 for acute decompensation of end-stage lung disease with severe right heart failure from pulmonary hypertension, discharged to hospice who presents to the ED with a near syncopal event when she suddenly became lethargic and urinated on herself while being transferred to her bed.  On arrival of EMS O2 sats were in the 60s on her baseline 8 L and she was placed on 15 L for transport   On presentation stable vitals on a 15 L of oxygen, VBG on NRB with pH 7.38 and pCO2 50, normal CBC, lactic acid 4.6, troponin 32, BNP 859, creatinine 2 with most recent baseline of 1.74.  Respiratory panel negative for COVID, flu and RSV.  EKG with NSR and nonspecific T wave changes. Chest x-ray with chronic changes of emphysema and ILD, superimposed infection or edema difficult to exclude. Patient received a dose of IV Lasix, started empirically on heparin infusion for concern of PE given recent discontinuation of Eliquis.  VQ scan ordered.  3/3: Vital stable, able to wean back to baseline use of oxygen at 8 L, D-dimer elevated at 1.68, procalcitonin 0.18, INR 1.1, troponin 32>>52, lactic acidosis resolved.  Slight worsening of creatinine, holding more IV diuresis as clinically appears euvolemic.  VQ scan indeterminate due to severe underlying lung disease, PE cannot be excluded at this time.    Assessment and Plan: * Acute on  chronic respiratory failure with hypoxia ([name]) Pulmonary fibrosis with severe pulmonary hypertension and right heart failure History of PE with thrombectomy Possible etiologies include PE, heart failure exacerbation Treating both possibilities, VQ scan inconclusive For possibility of PE: Started on heparin infusion in the ED For possibility of heart failure/volume overload: Given IV Lasix in the ED, holding further IV diuresis due to increasing creatinine and clinically appears euvolemic. Continuing heparin Continue high flow nasal cannula-able to wean back to baseline of 8 L  Lactic acidosis Suspect secondary to acute respiratory failure.   No acute infectious source identified at this time, sepsis not suspected at this time Resolved.  Acute metabolic encephalopathy Presyncope Currently at baseline now Presyncope fall episode with urinary incontinence in the setting of end-stage lung disease chronic medical issues Treat acute etiologies as outlined below Neurologic checks with fall and aspiration precautions  (HFpEF) heart failure with preserved ejection fraction ([name]) BNP elevated to 859 x-ray unable to rule out overlying edema Received Lasix in the ED Holding further IV Lasix due to worsening creatinine. Daily weights Holding off on echo as patient is on hospice  Mixed connective tissue disease ([name]) Scleroderma No acute concerns at this time  Stage 3b chronic kidney disease ([name]) Worsening creatinine at 2, it was 1.63-month ago. Patient did receive Lasix and clinically appears dry. -Holding further IV diuresis -Monitor renal function -Avoid nephrotoxins   Subjective: Patient was feeling little improved when seen today.  She was back to her baseline oxygen requirement.  She was not sure whether she will continue her anticoagulation  or not if needed.  She will think about it and let us know.  Physical Exam: Vitals:   09/28/23 1030 09/28/23 1100 09/28/23 1107 09/28/23  1200  BP: (!) 88/68 (!) 87/64    Pulse: 89 86 87   Resp: 20  17   Temp:    98 F (36.7 C)  TempSrc:    Oral  SpO2: 96% 99% 98%   Weight:      Height:       General.  Frail and malnourished lady, in no acute distress. Pulmonary.  Lungs clear bilaterally, normal respiratory effort. CV.  Mild sinus tachycardia Abdomen.  Soft, nontender, nondistended, BS positive. CNS.  Alert and oriented .  No focal neurologic deficit. Extremities.  No edema, no cyanosis, pulses intact and symmetrical. Psychiatry.  Judgment and insight appears normal.   Data Reviewed: Prior data reviewed  Family Communication: Tried calling sister with no response  Disposition: Status is: Inpatient Remains inpatient appropriate because: Severity of illness  Planned Discharge Destination: Home  Time spent:  minutes  This record has been created using Conservation officer, historic buildings. Errors have been sought and corrected,but may not always be located. Such creation errors do not reflect on the standard of care.   Author: Arnetha Courser, MD 09/28/2023 1:08 PM  For on call review [email or web address].

## 2023-09-28 NOTE — ED Triage Notes (Signed)
 Patient arrives via EMS-they state family called out and said they were moving patient to her bed and she became lethargic and urinated on her self. EMS states that patient's O2 saturations were in the 60's on her baseline of 8L nasal cannula. Patient arrives at 15L non-rebreather O2 saturation in the low 90's.

## 2023-09-28 NOTE — TOC CM/SW Note (Signed)
 Transition of Care [facility]) - Inpatient Brief Assessment   Patient Details  Name: Tammy Barrett MRN: [id number] Date of Birth: 10-30-1960  [facility] CM/SW Contact:    Margarito Liner, LCSW Phone Number: 09/28/2023, 10:54 AM   Clinical Narrative: CSW reviewed chart. Patient is currently active with [facility] for home hospice services. She uses 8 L chronic oxygen. No TOC needs identified at this time. CSW will continue to follow progress. Please place [facility] consult if any needs arise.  Transition of Care Asessment: Insurance and Status: Insurance coverage has been reviewed Patient has primary care physician: Yes Home environment has been reviewed: Single family home Prior level of function:: Not documented Prior/Current Home Services: Current home services Social Drivers of Health Review: SDOH reviewed interventions complete Readmission risk has been reviewed: Yes Transition of care needs: no transition of care needs at this time

## 2023-09-28 NOTE — Progress Notes (Signed)
 [facility] Liaison note  This patient is currently followed by [facility].  [facility] will follow through discharge disposition.  Please call with any Hospice related questions or concerns.     [facility] Liaison [phone]

## 2023-09-28 NOTE — Hospital Course (Addendum)
 Taken from H&P.  Tammy Barrett is a 63 y.o. female with medical history significant for chronic respiratory failure on 8L O2 severe pulmonary hypertension, emphysema, interstitial lung disease, chronic HFpEF, severe TR, severe LVH, mixed connective tissue disorder, CKD stage 3, scleroderma, prior PE s/p thrombectomy on Eliquis (discontinued a few days ago due to nosebleeds), last hospitalized at [facility] from 7/5 to 02/06/2023 for acute decompensation of end-stage lung disease with severe right heart failure from pulmonary hypertension, discharged to hospice who presents to the ED with a near syncopal event when she suddenly became lethargic and urinated on herself while being transferred to her bed.  On arrival of EMS O2 sats were in the 60s on her baseline 8 L and she was placed on 15 L for transport   On presentation stable vitals on a 15 L of oxygen, VBG on NRB with pH 7.38 and pCO2 50, normal CBC, lactic acid 4.6, troponin 32, BNP 859, creatinine 2 with most recent baseline of 1.74.  Respiratory panel negative for COVID, flu and RSV.  EKG with NSR and nonspecific T wave changes. Chest x-ray with chronic changes of emphysema and ILD, superimposed infection or edema difficult to exclude. Patient received a dose of IV Lasix, started empirically on heparin infusion for concern of PE given recent discontinuation of Eliquis.  VQ scan ordered.  3/3: Vital stable, able to wean back to baseline use of oxygen at 8 L, D-dimer elevated at 1.68, procalcitonin 0.18, INR 1.1, troponin 32>>52, lactic acidosis resolved.  Slight worsening of creatinine, holding more IV diuresis as clinically appears euvolemic.  VQ scan indeterminate due to severe underlying lung disease, PE cannot be excluded at this time.  3/4: Remained hemodynamically stable on her baseline oxygen requirement of 8 L.  As her VQ scan was indeterminate, we discussed with patient and she agrees to resume her home Eliquis at 2.5 mg twice daily.  Patient  will stop if she experience any further bleeding.  She was instructed to follow-up with her primary care provider for further assistance.  Her kidney function started improving after holding Lasix.  Patient is recently increased dose of Lasix along with metolazone.  We discontinued metolazone and decrease the Lasix to once daily as she appears dry. Her primary care can monitor and adjust the dose as needed.  Patient has softer blood pressure throughout her stay, currently at with lower goal.  We stopped her home amlodipine.  She will take Lasix 40 mg daily and antihypertensives can be added by PCP if needed.  Patient follow-up with hospice based on her underlying life limiting comorbidities with poor prognosis.  Patient will continue following with hospice and her provider for further assistance.

## 2023-09-28 NOTE — Progress Notes (Signed)
 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 She was found to be lethargic at home with low oxygen saturations and family activated EMS. [facility] was notified after patient admitted to the hospital. She has been admitted 3.3.25 with a diagnosis of acute on chronic respiratory failure with hypoxia. Per Dr. Patric Dykes with [facility] this is a related hospital admission.   Tammy Barrett remains in the ED, she is under going VQ scan to further assess for evidence of clots. She is receiving IV Heparin and they are working to wean her oxygen back down to her baseline of 8L.   She is inpatient appropriate with need for additional testing related to recent exacerbation as well as IV treatments.   Vital Signs: 98/86/17   87/64     spO2 98% on 8L HFNC I&O: not yet recorded Abnormal labs: Na+ 134, Glucose 181, BUN 64, Creatinine 2, Alk Phos 170, GFR 28, BNP 859, Troponin 32, Lactic 4.6  Diagnostics:  PORTABLE CHEST 1 VIEW  IMPRESSION: Emphysema and chronic interstitial lung disease similar to 01/30/2023. Superimposed infection or edema is difficult to exclude.  Electronically Signed   By: Minerva Fester M.D.   On: 09/28/2023 03:39   IV/PRN Meds: Lasix 40mg  once and 20mg  BID IV, Heparin 10.16ml/H IV continuous  Problem List as per H&P Dr. 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   No acute infectious source identified at this time, sepsis not suspected at this time Will treat respiratory failure and monitor lactic acid   Acute metabolic encephalopathy Presyncope Presyncope fall episode with urinary incontinence in the setting of end-stage lung disease chronic medical issues Treat acute etiologies as outlined below Neurologic checks with fall and aspiration precautions   (HFpEF) heart failure with preserved ejection fraction ([name]) BNP elevated to 59 x-ray unable to rule out overlying edema Received Lasix in the ED "Continue Lasix as a therapeutic trial Daily weights Holding off on echo as patient is on hospice   Discharge Planning: Ongoing, likely back home once acute medical treatment is complete Family Contact: talked with caregiver by phone IDT: Updated Goals of Care: DNR Thea Gist, BSN RN Hospice hospital liaison [phone]

## 2023-09-28 NOTE — Assessment & Plan Note (Addendum)
 Presyncope Currently at baseline now Presyncope fall episode with urinary incontinence in the setting of end-stage lung disease chronic medical issues Treat acute etiologies as outlined below Neurologic checks with fall and aspiration precautions

## 2023-09-28 NOTE — ED Notes (Signed)
 Dr Nelson Chimes notified and aware of pts BP of 87/63 with a MAP of 73.

## 2023-09-28 NOTE — Assessment & Plan Note (Addendum)
 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 Continuing heparin Continue high flow nasal cannula-able to wean back to baseline of 8 L

## 2023-09-28 NOTE — Progress Notes (Signed)
 PHARMACY - ANTICOAGULATION CONSULT NOTE  Pharmacy Consult for Heparin  Indication: pulmonary embolus  Allergies  Allergen Reactions   Hydroxychloroquine     headaches   Meclizine     headaches    Patient Measurements: Height: 5\' 5"  (165.1 cm) Weight: 60.4 kg (133 lb 2.5 oz) IBW/kg (Calculated) : 57 Heparin Dosing Weight: 60.4 kg   Vital Signs: Temp: 98.2 F (36.8 C) (03/03 2132) Temp Source: Oral (03/03 2132) BP: 104/71 (03/03 2132) Pulse Rate: 92 (03/03 2132)  Labs: Recent Labs    09/28/23 0309 09/28/23 0456 09/28/23 1349 09/28/23 2220  HGB 13.8  --   --   --   HCT 43.5  --   --   --   PLT 157  --   --   --   APTT  --  25 83*  --   LABPROT  --  14.1  --   --   INR  --  1.1  --   --   HEPARINUNFRC  --  <0.10*  --  0.41  CREATININE 2.00*  --   --   --   TROPONINIHS 32* 52*  --   --     Estimated Creatinine Clearance: 26.2 mL/min (A) (by C-G formula based on SCr of 2 mg/dL (H)).   Medical History: Past Medical History:  Diagnosis Date   COPD (chronic obstructive pulmonary disease) ([name])    Discoid lupus    Eczema    Esophageal dysmotility    Genital herpes    GERD (gastroesophageal reflux disease)    HSV (herpes simplex virus) infection    Hypertension    Lumbago    Lumbar disc disease    Peripheral neuropathy    Pulmonary fibrosis ([name])    Pulmonary hypertension ([name])    Raynaud's disease    Sclerodactyly    Scleroderma ([name])    Spinal stenosis    Assessment: Pharmacy consulted to dose heparin in this 63 year old female admitted with PE.  Pt was on Eliquis 5 mg PO BID PTA,  last dose was on Fri 2/28.   Pt was told to stop Eliquis due to bleeding issues.  CrCl = 26.2 ml/min , HL @ 0456 = < 0.1 (Baseline)   Date Time Results Comments  3/3 0547 aPTT=25 Heparin infusion started at 1050 units/hour  3/3 1349 aPTT=83 Therapeutic  3/3 2220 HL = 0.41 Therapeutic x 2        Goal of Therapy:  Heparin level 0.3-0.7 units/ml aPTT 66 - 102  seconds Monitor platelets by anticoagulation protocol: Yes   Plan:  Will continue heparin infusion rate at 1050 units/hours Recheck HL daily w/ AM labs while therapeutic CBC daily while on heparin infusion  Otelia Sergeant, PharmD, [facility] 09/28/2023 10:50 PM

## 2023-09-28 NOTE — ED Notes (Signed)
Pt transported to Nuclear Medicine at this time.  

## 2023-09-28 NOTE — Assessment & Plan Note (Addendum)
 BNP elevated to 859 x-ray unable to rule out overlying edema Received Lasix in the ED Holding further IV Lasix due to worsening creatinine. Daily weights Holding off on echo as patient is on hospice

## 2023-09-28 NOTE — Discharge Instructions (Signed)
 the [facility] offers a [employer] that anyone can call toll free at [phone]. The friendship line is available 24 hours a day  [employer] is a Program of All-inclusive Care for the Elderly (PACE). Their mission is to promote and sustain the independence of seniors wishing to remain in the community. They provide seniors with comprehensive long-term health, social, medical and dietary care. Their program is a safe alternative to nursing home care. [phone]  [facility] Physical Address [facility] [address] [place], [place] [address] Phone: [phone]. . Online zoom yoga class, connect with others without leaving your home [facility] offers [place] dance cardio sessions for individuals via Zoom. This program provides: - Dance fitness activities Please contact program for more information. Servinganyone in need adults 18+ hiv/aids individuals families Call [phone]  Email [email or web address] [email or web address] to get more info  [employer] offers an online [employer] to individuals where they can receive help to focus on their best health. Whether you're a [employer] member or not, the neighborhood center offers a... Main Serviceshealth education  exercise & fitness  community support services  recreation  virtual support Other Servicessupport groups Servinganyone in need adults young adults teens seniors individuals families [email or web address] to get more info  Schedule on their website  The [facility] offers an array of activities for adults age 27 and over. This program provides:- Fitness and health programs- Tech classes- Activity books Main Serviceshealth education  community support services  exercise & fitness  recreation  more education Servingseniors  Call [phone]    For more resources go online to [email or web address] and type in you zipcode

## 2023-09-28 NOTE — ED Provider Notes (Addendum)
 [facility] Provider Note    Event Date/Time   First MD Initiated Contact with Patient 09/28/23 [phone]     (approximate)   History   Shortness of Breath and Fatigue   HPI  Tammy Barrett is a 63 y.o. female with CHF, scleroderma, lupus pulmonary hypertension who is on 8 L at baseline who comes in with shortness of breath.  Patient reports that she fell like she needed to urinate when just trying to get up when she felt more short of breath and urinated on herself and felt more fatigued.  She states that she is on hospice after they told her there was nothing else they can do for her advanced lung disease.  No chest pain.  No swelling in legs.  She does report a history of blood clots a year ago but is not currently on anticoagulation secondary to bleeding issues.  She denies any falls, hitting her head, confusion   I reviewed patient's admission summary from 01/30/2023 [facility] for pulm HTN consult on 7/8 and discussed that the patient is not a candidate for any further treatments and there was a risk of deterioration outweighing the risk of benefit. In light of lack of treatment options, patient had multiple discussion with ICU team, case management, and Palliative care over many days. Family meeting on 7/11 involved the patient, Jerrye Beavers (sister), French Ana (daughter) and Eulah Pont (friend) and the ICU team.   Physical Exam   Triage Vital Signs: ED Triage Vitals  Encounter Vitals Group     BP      Systolic BP Percentile      Diastolic BP Percentile      Pulse      Resp      Temp      Temp src      SpO2      Weight      Height      Head Circumference      Peak Flow      Pain Score      Pain Loc      Pain Education      Exclude from Growth Chart     Most recent vital signs: Vitals:   09/28/23 0303 09/28/23 0304  BP:    Pulse: 94 91  Resp: (!) 21 (!) 23  Temp:  (!) 97.3 F (36.3 C)  SpO2: 96% 93%     General: Awake, no distress.  CV:  Good  peripheral perfusion.  Resp:  Normal effort.  Abd:  No distention.  Other:  No wheezing noted.  No obvious swelling in legs.   ED Results / Procedures / Treatments   Labs (all labs ordered are listed, but only abnormal results are displayed) Labs Reviewed  CBC WITH DIFFERENTIAL/PLATELET - Abnormal; Notable for the following components:      Result Value   MCV 102.8 (*)    All other components within normal limits  COMPREHENSIVE METABOLIC PANEL - Abnormal; Notable for the following components:   Sodium 134 (*)    Chloride 90 (*)    Glucose, Bld 181 (*)    BUN 64 (*)    Creatinine, Ser 2.00 (*)    Alkaline Phosphatase 170 (*)    GFR, Estimated 28 (*)    Anion gap 17 (*)    All other components within normal limits  BRAIN NATRIURETIC PEPTIDE - Abnormal; Notable for the following components:   B Natriuretic Peptide 859.3 (*)    All other  components within normal limits  LACTIC ACID, PLASMA - Abnormal; Notable for the following components:   Lactic Acid, Venous 4.6 (*)    All other components within normal limits  BLOOD GAS, VENOUS - Abnormal; Notable for the following components:   Bicarbonate 29.6 (*)    Acid-Base Excess 3.5 (*)    All other components within normal limits  TROPONIN I (HIGH SENSITIVITY) - Abnormal; Notable for the following components:   Troponin I (High Sensitivity) 32 (*)    All other components within normal limits  RESP PANEL BY RT-PCR (RSV, FLU A&B, COVID)  RVPGX2  CULTURE, BLOOD (ROUTINE X 2)  CULTURE, BLOOD (ROUTINE X 2)  LACTIC ACID, PLASMA  URINALYSIS, ROUTINE W REFLEX MICROSCOPIC  TROPONIN I (HIGH SENSITIVITY)     EKG  My interpretation of EKG:  Normal sinus rhythm 93 without any ST elevation, no T wave inversions little bit of diffuse ST depressions in V2 QTc 542  RADIOLOGY I have reviewed the xray personally and interpreted and patient has chronic interstitial lung disease   PROCEDURES:  Critical Care performed: Yes, see critical  care procedure note(s)  .1-3 Lead EKG Interpretation  Performed by: Concha Se, MD Authorized by: Concha Se, MD     Interpretation: normal     ECG rate:  85   ECG rate assessment: normal     Rhythm: sinus rhythm     Ectopy: none     Conduction: normal   .Critical Care  Performed by: Concha Se, MD Authorized by: Concha Se, MD   Critical care provider statement:    Critical care time (minutes):  30   Critical care was necessary to treat or prevent imminent or life-threatening deterioration of the following conditions:  Respiratory failure   Critical care was time spent personally by me on the following activities:  Development of treatment plan with patient or surrogate, discussions with consultants, evaluation of patient's response to treatment, examination of patient, ordering and review of laboratory studies, ordering and review of radiographic studies, ordering and performing treatments and interventions, pulse oximetry, re-evaluation of patient's condition and review of old charts    MEDICATIONS ORDERED IN ED: Medications  furosemide (LASIX) injection 40 mg (has no administration in time range)     IMPRESSION / MDM / ASSESSMENT AND PLAN / ED COURSE  I reviewed the triage vital signs and the nursing notes.   Patient's presentation is most consistent with acute presentation with potential threat to life or bodily function.   Patient is a 63 year old who comes in with shortness of breath, increasing oxygen bands.  Patient saturations were in the 60s on her baseline oxygen.  Patient was converted to high flow nasal cannula.  Workup was done to evaluate for fluid, ACS, pneumonia, COVID, flu  Patient reports that she is on hospice but she does not want further workup to evaluate what is going on and to try to help stabilize her breathing is wanting medications.  VBG is reassuring without evidence of hypercapnia.  CBC is normal CMP shows slight elevation of  creatinine from 8 months ago up to 2.  Troponin slightly elevated most likely demand.  BNP slightly elevated  Patient has a history of PE states that she was taken off Eliquis due to nosebleeds.  When I was reviewing some prior notes however it said that they wanted her to be on it indefinitely I have high suspicion for PE but given patient's kidney function and then there will  be a risk of doing the CT scan.  Unable to do VQ scan due to underlying advanced lung disease patient is okay with starting empiric heparin.  She denies any headaches, falls and hitting her head, confusion to suggest any other CT scan of her head first.  Given the concern for elevated BNP will also give a dose of Lasix in case there is any fluid.  Given new oxygen demand will discuss with hospital team for admission  After admission patient's lactic did come back elevated but patient has done better from a breathing standpoint with Lasix.  Will trial dose of Lasix and the lactate is rising may need fluid.  Patient does not appear septic and that patient has no fever, no white count.  I will add on procalcitonin.  The patient is on the cardiac monitor to evaluate for evidence of arrhythmia and/or significant heart rate changes.  Clinical Course as of 09/28/23 0429  Mon Sep 28, 2023  0347 DG Chest Portable 1 View [MF]    Clinical Course User Index [MF] Concha Se, MD     FINAL CLINICAL IMPRESSION(S) / ED DIAGNOSES   Final diagnoses:  Acute respiratory failure with hypoxia [facility])     Rx / DC Orders   ED Discharge Orders     None        Note:  This document was prepared using Dragon voice recognition software and may include unintentional dictation errors.   Concha Se, MD 09/28/23 1610    Concha Se, MD 09/28/23 [phone]

## 2023-09-28 NOTE — Assessment & Plan Note (Addendum)
 Suspect secondary to acute respiratory failure.   No acute infectious source identified at this time, sepsis not suspected at this time Resolved.

## 2023-09-28 NOTE — Assessment & Plan Note (Addendum)
 Scleroderma No acute concerns at this time

## 2023-09-28 NOTE — Progress Notes (Signed)
 A consult to HF Navigation Team has been placed. Unfortunately, this patient does not meet criteria due to following with hospice Team. Will sign off, but please feel free to reach out with any questions or medication assistance needs.   Thank you for involving the HF Navigation Team in this patient's care.   Enos Fling, PharmD, BCPS Clinical Pharmacist 03/05/2023 12:50 PM

## 2023-09-28 NOTE — Progress Notes (Signed)
 PHARMACY - ANTICOAGULATION CONSULT NOTE  Pharmacy Consult for Heparin  Indication: pulmonary embolus  Allergies  Allergen Reactions   Hydroxychloroquine     headaches   Meclizine     headaches    Patient Measurements: Height: 5\' 5"  (165.1 cm) Weight: 60.4 kg (133 lb 2.5 oz) IBW/kg (Calculated) : 57 Heparin Dosing Weight: 60.4 kg   Vital Signs: Temp: 98 F (36.7 C) (03/03 1200) Temp Source: Oral (03/03 1200) BP: 96/78 (03/03 1330) Pulse Rate: 89 (03/03 1330)  Labs: Recent Labs    09/28/23 0309 09/28/23 0456 09/28/23 1349  HGB 13.8  --   --   HCT 43.5  --   --   PLT 157  --   --   APTT  --  25 83*  LABPROT  --  14.1  --   INR  --  1.1  --   HEPARINUNFRC  --  <0.10*  --   CREATININE 2.00*  --   --   TROPONINIHS 32* 52*  --     Estimated Creatinine Clearance: 26.2 mL/min (A) (by C-G formula based on SCr of 2 mg/dL (H)).   Medical History: Past Medical History:  Diagnosis Date   COPD (chronic obstructive pulmonary disease) ([name])    Discoid lupus    Eczema    Esophageal dysmotility    Genital herpes    GERD (gastroesophageal reflux disease)    HSV (herpes simplex virus) infection    Hypertension    Lumbago    Lumbar disc disease    Peripheral neuropathy    Pulmonary fibrosis ([name])    Pulmonary hypertension ([name])    Raynaud's disease    Sclerodactyly    Scleroderma ([name])    Spinal stenosis    Assessment: Pharmacy consulted to dose heparin in this 63 year old female admitted with PE.  Pt was on Eliquis 5 mg PO BID PTA,  last dose was on Fri 2/28.   Pt was told to stop Eliquis due to bleeding issues.  CrCl = 26.2 ml/min , HL @ 0456 = < 0.1 (Baseline)   Date Time Results Comments  3/3 0547 aPTT=25 Heparin infusion started at 1050 units/hour  3/3 1349 aPTT=83 Therapeutic             Goal of Therapy:  Heparin level 0.3-0.7 units/ml aPTT 66 - 102 seconds Monitor platelets by anticoagulation protocol: Yes   Plan:  aPTT within desired therapoeutic  range. Will continue curre heparin infusion rate at 1050 units/hours and order HL in 8 hours to confirm.  Patient reports last apixaban on 2/28. Baseline HL confirmed apixaban effect not presents and correlating with aPTT. No need to monitoring aPTT. CBC daily while on heparin infusion  Sybrina Laning Rodriguez-Guzman PharmD, BCPS 09/28/2023 2:50 PM

## 2023-09-28 NOTE — Progress Notes (Addendum)
 PHARMACY - ANTICOAGULATION CONSULT NOTE  Pharmacy Consult for Heparin  Indication: pulmonary embolus  Allergies  Allergen Reactions   Hydroxychloroquine     headaches   Meclizine     headaches    Patient Measurements: Height: 5\' 5"  (165.1 cm) Weight: 60.4 kg (133 lb 2.5 oz) IBW/kg (Calculated) : 57 Heparin Dosing Weight: 60.4 kg   Vital Signs: Temp: 97.3 F (36.3 C) (03/03 0304) Temp Source: Oral (03/03 0304) BP: 96/67 (03/03 0501) Pulse Rate: 84 (03/03 0501)  Labs: Recent Labs    09/28/23 0309  HGB 13.8  HCT 43.5  PLT 157  CREATININE 2.00*  TROPONINIHS 32*    Estimated Creatinine Clearance: 26.2 mL/min (A) (by C-G formula based on SCr of 2 mg/dL (H)).   Medical History: Past Medical History:  Diagnosis Date   COPD (chronic obstructive pulmonary disease) ([name])    Discoid lupus    Eczema    Esophageal dysmotility    Genital herpes    GERD (gastroesophageal reflux disease)    HSV (herpes simplex virus) infection    Hypertension    Lumbago    Lumbar disc disease    Peripheral neuropathy    Pulmonary fibrosis ([name])    Pulmonary hypertension ([name])    Raynaud's disease    Sclerodactyly    Scleroderma ([name])    Spinal stenosis     Medications:  (Not in a hospital admission)   Assessment: Pharmacy consulted to dose heparin in this 63 year old female admitted with PE.  Pt was on Eliquis 5 mg PO BID PTA,  last dose was on Fri 2/28.   Pt was told to stop Eliquis due to bleeding issues.   CrCl = 26.2 ml/min   3/3:  HL @ 0456 = < 0.1 (Baseline)   Goal of Therapy:  Heparin level 0.3-0.7 units/ml aPTT 66 - 102 seconds Monitor platelets by anticoagulation protocol: Yes   Plan:  - Will order baseline anticoag labs to rule out continued effect of Eliquis PTA. - Baseline HL = < 0.1, not elevated.  Eliquis does not appear to be exerting effect so can use HL to dose.  - Will order heparin 3600 units IV X 1 bolus and start heparin drip @ 1050 units/hr - Will  draw HL 8 hrs after start of drip   -Zanae Kuehnle D 09/28/2023,5:09 AM

## 2023-09-28 NOTE — H&P (Signed)
 History and Physical    Patient: Tammy Barrett WJX:[id number] DOB: 12/11/1960 DOA: 09/28/2023 DOS: the patient was seen and examined on 09/28/2023 PCP: Marisue Ivan, MD  Patient coming from: Home  Chief Complaint:  Chief Complaint  Patient presents with   Shortness of Breath   Fatigue    HPI: Tammy Barrett is a 63 y.o. female with medical history significant for chronic respiratory failure on 8L O2 severe pulmonary hypertension, emphysema, interstitial lung disease, chronic HFpEF, severe TR, severe LVH, mixed connective tissue disorder, CKD stage 3, scleroderma, prior PE s/p thrombectomy on Eliquis (discontinued a few days ago due to nosebleeds), last hospitalized at [facility] from 7/5 to 02/06/2023 for acute decompensation of end-stage lung disease with severe right heart failure from pulmonary hypertension, discharged to hospice who presents to the ED with a near syncopal event when she suddenly became lethargic and urinated on herself while being transferred to her bed.  On arrival of EMS O2 sats were in the 60s on her baseline 8 L and she was placed on 15 L for transport  ED course and data review:Arrival temp 97.3, BP 112/83 pulse 101 respirations 22 And O2 92% on 15 L Workup notable for the following: VBG on NRB with pH 7.38 and pCO2 50 Normal CBC with lactic acid 4.6 Troponin 32 and BNP 859 Creatinine of 2 with most recent baseline 1.74 Respiratory viral panel negative for COVID flu and RSV UA pending EKG, personally viewed and interpreted showing sinus at 93 nonspecific T wave changes Chest x-ray showing similar findings to prior with emphysema and chronic interstitial lung disease, superimposed infection or edema difficult to exclude Patient treated with Lasix 40 mg Started empirically on a heparin infusion for possibility of PE given recent discontinuation of Eliquis  Hospitalist consulted for admission.     Past Medical History:  Diagnosis Date   COPD (chronic  obstructive pulmonary disease) ([name])    Discoid lupus    Eczema    Esophageal dysmotility    Genital herpes    GERD (gastroesophageal reflux disease)    HSV (herpes simplex virus) infection    Hypertension    Lumbago    Lumbar disc disease    Peripheral neuropathy    Pulmonary fibrosis ([name])    Pulmonary hypertension ([name])    Raynaud's disease    Sclerodactyly    Scleroderma ([name])    Spinal stenosis    Past Surgical History:  Procedure Laterality Date   bone removal from pinkie toe     COLONOSCOPY WITH PROPOFOL N/A 08/30/2021   Procedure: COLONOSCOPY WITH PROPOFOL;  Surgeon: Wyline Mood, MD;  Location: [facility] ENDOSCOPY;  Service: Gastroenterology;  Laterality: N/A;   DILATION AND CURETTAGE OF UTERUS     PULMONARY THROMBECTOMY N/A 03/07/2022   Procedure: PULMONARY THROMBECTOMY;  Surgeon: Annice Needy, MD;  Location: [facility] INVASIVE CV LAB;  Service: Cardiovascular;  Laterality: N/A;   RIGHT HEART CATH Right 09/17/2018   Procedure: RIGHT HEART CATH;  Surgeon: Dalia Heading, MD;  Location: [facility] INVASIVE CV LAB;  Service: Cardiovascular;  Laterality: Right;   SPLENECTOMY     TUBAL LIGATION     Social History:  reports that she quit smoking about 12 years ago. Her smoking use included cigarettes. She has never used smokeless tobacco. She reports that she does not drink alcohol and does not use drugs.  Allergies  Allergen Reactions   Hydroxychloroquine     headaches   Meclizine     headaches  Family History  Problem Relation Age of Onset   Uterine cancer Mother        unknown age   Stroke Father    Breast cancer Neg Hx     Prior to Admission medications   Medication Sig Start Date End Date Taking? Authorizing Provider  amLODipine (NORVASC) 10 MG tablet Take 10 mg by mouth daily. 11/13/21   [provider]  apixaban (ELIQUIS) 5 MG TABS tablet Take 1 tablet (5 mg total) by mouth 2 (two) times daily. 03/24/22   Arnetha Courser, MD  feeding supplement (ENSURE ENLIVE /  ENSURE PLUS) LIQD Take 237 mLs by mouth 3 (three) times daily between meals. 03/24/22   Arnetha Courser, MD  furosemide (LASIX) 40 MG tablet Take 1 tablet (40 mg total) by mouth 2 (two) times daily. 03/24/22   Arnetha Courser, MD  gabapentin (NEURONTIN) 600 MG tablet Take 600 mg by mouth 3 (three) times daily.     [provider]  mirtazapine (REMERON) 7.5 MG tablet Take 7.5 mg by mouth at bedtime.    [provider]  Multiple Vitamins-Minerals (MULTIVITAMIN WITH MINERALS) tablet Take 1 tablet by mouth daily.    [provider]  mycophenolate (CELLCEPT) 500 MG tablet Take 500 mg by mouth 2 (two) times daily.    [provider]  potassium chloride (KLOR-CON M) 10 MEQ tablet Take by mouth. 09/29/22 09/29/23  [provider]  tadalafil, PAH, (ADCIRCA) 20 MG tablet TAKE 1 TABLET (20 MG TOTAL) BY MOUTH ONCE DAILY 06/20/19   [provider]    Physical Exam: Vitals:   09/28/23 0304 09/28/23 0415 09/28/23 0501 09/28/23 0503  BP:   96/67   Pulse: 91 85 84   Resp: (!) 23 15 14    Temp: (!) 97.3 F (36.3 C)     TempSrc: Oral     SpO2: 93% 99% 97%   Weight:    60.4 kg  Height:    5\' 5"  (1.651 m)   Physical Exam Vitals and nursing note reviewed.  Constitutional:      Appearance: She is underweight.     Comments: Awake, alert, conversational dyspnea  HENT:     Head: Normocephalic and atraumatic.  Cardiovascular:     Rate and Rhythm: Normal rate and regular rhythm.     Heart sounds: Normal heart sounds.  Pulmonary:     Effort: Tachypnea present.     Breath sounds: Decreased breath sounds and wheezing present.  Abdominal:     Palpations: Abdomen is soft.     Tenderness: There is no abdominal tenderness.  Musculoskeletal:     Right lower leg: 1+ Edema present.     Left lower leg: 1+ Edema present.  Neurological:     Mental Status: Mental status is at baseline.     Labs on Admission: I have personally reviewed following labs and imaging  studies  CBC: Recent Labs  Lab 09/28/23 0309  WBC 4.3  NEUTROABS 2.6  HGB 13.8  HCT 43.5  MCV 102.8*  PLT 157   Basic Metabolic Panel: Recent Labs  Lab 09/28/23 0309  NA 134*  K 3.5  CL 90*  CO2 27  GLUCOSE 181*  BUN 64*  CREATININE 2.00*  CALCIUM 9.7   GFR: Estimated Creatinine Clearance: 26.2 mL/min (A) (by C-G formula based on SCr of 2 mg/dL (H)). Liver Function Tests: Recent Labs  Lab 09/28/23 0309  AST 36  ALT 19  ALKPHOS 170*  BILITOT 1.2  PROT 8.0  ALBUMIN 4.0   No results for input(s): "LIPASE", "AMYLASE" in the last 168 hours. No results for input(s): "AMMONIA" in the last 168 hours. Coagulation Profile: No results for input(s): "INR", "PROTIME" in the last 168 hours. Cardiac Enzymes: No results for input(s): "CKTOTAL", "CKMB", "CKMBINDEX", "TROPONINI" in the last 168 hours. BNP (last 3 results) No results for input(s): "PROBNP" in the last 8760 hours. HbA1C: No results for input(s): "HGBA1C" in the last 72 hours. CBG: No results for input(s): "GLUCAP" in the last 168 hours. Lipid Profile: No results for input(s): "CHOL", "HDL", "LDLCALC", "TRIG", "CHOLHDL", "LDLDIRECT" in the last 72 hours. Thyroid Function Tests: No results for input(s): "TSH", "T4TOTAL", "FREET4", "T3FREE", "THYROIDAB" in the last 72 hours. Anemia Panel: No results for input(s): "VITAMINB12", "FOLATE", "FERRITIN", "TIBC", "IRON", "RETICCTPCT" in the last 72 hours. Urine analysis: No results found for: "COLORURINE", "APPEARANCEUR", "LABSPEC", "PHURINE", "GLUCOSEU", "HGBUR", "BILIRUBINUR", "KETONESUR", "PROTEINUR", "UROBILINOGEN", "NITRITE", "LEUKOCYTESUR"  Radiological Exams on Admission: DG Chest Portable 1 View Result Date: 09/28/2023 CLINICAL DATA:  Moving patient to bed when she became lethargic and incontinence. Low oxygen saturations. Shortness of breath. EXAM: PORTABLE CHEST 1 VIEW COMPARISON:  Radiograph and CT 01/30/2023 FINDINGS: Stable cardiomegaly. Aortic  atherosclerotic calcification. No pleural effusion or pneumothorax. Chronic coarsening of the interstitial markings throughout both lungs. Superimposed infection or edema is difficult to exclude. No focal pneumonia. No displaced rib fractures. IMPRESSION: Emphysema and chronic interstitial lung disease similar to 01/30/2023. Superimposed infection or edema is difficult to exclude. Electronically Signed   By: Minerva Fester M.D.   On: 09/28/2023 03:39     Data Reviewed: Relevant notes from primary care and specialist visits, past discharge summaries as available in EHR, including Care Everywhere. Prior diagnostic testing as pertinent to current admission diagnoses Updated medications and problem lists for reconciliation ED course, including vitals, labs, imaging, treatment and response to treatment Triage notes, nursing and pharmacy notes and ED provider's notes Notable results as noted in HPI   Assessment and Plan: * Acute on chronic respiratory failure with hypoxia ([name]) Pulmonary fibrosis with severe pulmonary hypertension and right heart failure History of PE with thrombectomy Possible etiologies include PE, heart failure exacerbation Treating both possibilities For possibility of PE: Started on heparin infusion in the ED For possibility of heart failure/volume overload: Given IV Lasix in the ED Continuing heparin Will continue Lasix Follow-up VQ scan Continue high flow nasal cannula  Lactic acidosis Suspect secondary to acute respiratory failure.   No acute infectious source identified at this time, sepsis not suspected at this time Will treat respiratory failure and monitor lactic acid  Acute metabolic encephalopathy Presyncope Presyncope fall episode with urinary incontinence in the setting of end-stage lung disease chronic medical issues Treat acute etiologies as outlined below Neurologic checks with fall and aspiration precautions  (HFpEF) heart failure with preserved  ejection fraction ([name]) BNP elevated to 59 x-ray unable to rule out overlying edema Received Lasix in the ED "Continue Lasix as a therapeutic trial Daily weights Holding off on echo as patient is on hospice  Mixed connective tissue disease ([name]) Scleroderma No acute concerns at this time  Stage 3b chronic kidney disease ([name]) At baseline    DVT prophylaxis: heparin infusion   Consults: none  Advance Care Planning:   Code Status: Prior   Family Communication: none  Disposition Plan: Back to previous home environment  Severity of Illness: The appropriate patient status for this patient is INPATIENT. Inpatient status is judged to be reasonable and necessary in order  to provide the required intensity of service to ensure the patient's safety. The patient's presenting symptoms, physical exam findings, and initial radiographic and laboratory data in the context of their chronic comorbidities is felt to place them at high risk for further clinical deterioration. Furthermore, it is not anticipated that the patient will be medically stable for discharge from the hospital within 2 midnights of admission.   * I certify that at the point of admission it is my clinical judgment that the patient will require inpatient hospital care spanning beyond 2 midnights from the point of admission due to high intensity of service, high risk for further deterioration and high frequency of surveillance required.*  Author: Andris Baumann, MD 09/28/2023 5:26 AM  For on call review [email or web address].

## 2023-09-29 DIAGNOSIS — J9601 Acute respiratory failure with hypoxia: Secondary | ICD-10-CM | POA: Diagnosis not present

## 2023-09-29 DIAGNOSIS — M351 Other overlap syndromes: Secondary | ICD-10-CM

## 2023-09-29 DIAGNOSIS — J9621 Acute and chronic respiratory failure with hypoxia: Secondary | ICD-10-CM | POA: Diagnosis not present

## 2023-09-29 DIAGNOSIS — G9341 Metabolic encephalopathy: Secondary | ICD-10-CM | POA: Diagnosis not present

## 2023-09-29 DIAGNOSIS — I5032 Chronic diastolic (congestive) heart failure: Secondary | ICD-10-CM | POA: Diagnosis not present

## 2023-09-29 LAB — BASIC METABOLIC PANEL
Anion gap: 10 (ref 5–15)
BUN: 66 mg/dL — ABNORMAL HIGH (ref 8–23)
CO2: 33 mmol/L — ABNORMAL HIGH (ref 22–32)
Calcium: 9.4 mg/dL (ref 8.9–10.3)
Chloride: 94 mmol/L — ABNORMAL LOW (ref 98–111)
Creatinine, Ser: 1.64 mg/dL — ABNORMAL HIGH (ref 0.44–1.00)
GFR, Estimated: 35 mL/min — ABNORMAL LOW (ref >60)
Glucose, Bld: 90 mg/dL (ref 70–99)
Potassium: 3.2 mmol/L — ABNORMAL LOW (ref 3.5–5.1)
Sodium: 137 mmol/L (ref 135–145)

## 2023-09-29 LAB — CBC
HCT: 36.5 % (ref 36.0–46.0)
Hemoglobin: 12.3 g/dL (ref 12.0–15.0)
MCH: 32.4 pg (ref 26.0–34.0)
MCHC: 33.7 g/dL (ref 30.0–36.0)
MCV: 96.1 fL (ref 80.0–100.0)
Platelets: 162 10*3/uL (ref 150–400)
RBC: 3.8 MIL/uL — ABNORMAL LOW (ref 3.87–5.11)
RDW: 14.6 % (ref 11.5–15.5)
WBC: 4 10*3/uL (ref 4.0–10.5)
nRBC: 0 % (ref 0.0–0.2)

## 2023-09-29 LAB — HEPARIN LEVEL (UNFRACTIONATED): Heparin Unfractionated: 0.45 [IU]/mL (ref 0.30–0.70)

## 2023-09-29 MED ORDER — FUROSEMIDE 40 MG PO TABS: 40.0000 mg | ORAL_TABLET | Freq: Every day | ORAL | Status: DC

## 2023-09-29 MED ORDER — APIXABAN 2.5 MG PO TABS
2.5000 mg | ORAL_TABLET | Freq: Two times a day (BID) | ORAL | Status: DC
  Administered 2023-09-29: 2.5 mg via ORAL
  Filled 2023-09-29: qty 1

## 2023-09-29 MED ORDER — POTASSIUM CHLORIDE CRYS ER 20 MEQ PO TBCR
40.0000 meq | EXTENDED_RELEASE_TABLET | Freq: Once | ORAL | Status: AC
  Administered 2023-09-29: 40 meq via ORAL
  Filled 2023-09-29: qty 2

## 2023-09-29 MED ORDER — ELIQUIS 2.5 MG PO TABS: 2.5000 mg | ORAL_TABLET | Freq: Every day | ORAL | 2 refills | Status: DC

## 2023-09-29 MED ORDER — APIXABAN 5 MG PO TABS: 5.0000 mg | ORAL_TABLET | Freq: Two times a day (BID) | ORAL | Status: DC

## 2023-09-29 MED ORDER — GABAPENTIN 300 MG PO CAPS: 300.0000 mg | ORAL_CAPSULE | Freq: Three times a day (TID) | ORAL | 2 refills | Status: DC

## 2023-09-29 NOTE — Discharge Summary (Signed)
 Physician Discharge Summary   Patient: Tammy Barrett MRN: [id number] DOB: [birth year]  Admit date:     09/28/2023  Discharge date: 09/29/23  Discharge Physician: Arnetha Courser   PCP: Marisue Ivan, MD   Recommendations at discharge:  Please obtain CBC and BMP on follow-up We stopped home amlodipine due to softer blood pressure We decreased the dose of home Lasix to once daily due to AKI.  Metolazone was also discontinued.  Need close follow-up and these medications can vary added if needed Follow-up with primary care provider Follow-up with home hospice  Discharge Diagnoses: Principal Problem:   Acute on chronic respiratory failure with hypoxia ([name]) Active Problems:   (HFpEF) heart failure with preserved ejection fraction ([name])   Acute metabolic encephalopathy   Lactic acidosis   Pulmonary fibrosis ([name])   Pulmonary hypertension ([name])   Mixed connective tissue disease ([name])   Acute right heart failure ([name])   Stage 3b chronic kidney disease ([name])   Scleroderma ([name])   Acute respiratory failure with hypoxia [facility])   Hospital Course: Taken from H&P.  Tammy Barrett is a 63 y.o. female with medical history significant for chronic respiratory failure on 8L O2 severe pulmonary hypertension, emphysema, interstitial lung disease, chronic HFpEF, severe TR, severe LVH, mixed connective tissue disorder, CKD stage 3, scleroderma, prior PE s/p thrombectomy on Eliquis (discontinued a few days ago due to nosebleeds), last hospitalized at [facility] from 7/5 to 02/06/2023 for acute decompensation of end-stage lung disease with severe right heart failure from pulmonary hypertension, discharged to hospice who presents to the ED with a near syncopal event when she suddenly became lethargic and urinated on herself while being transferred to her bed.  On arrival of EMS O2 sats were in the 60s on her baseline 8 L and she was placed on 15 L for transport   On presentation stable vitals on a 15 L of  oxygen, VBG on NRB with pH 7.38 and pCO2 50, normal CBC, lactic acid 4.6, troponin 32, BNP 859, creatinine 2 with most recent baseline of 1.74.  Respiratory panel negative for COVID, flu and RSV.  EKG with NSR and nonspecific T wave changes. Chest x-ray with chronic changes of emphysema and ILD, superimposed infection or edema difficult to exclude. Patient received a dose of IV Lasix, started empirically on heparin infusion for concern of PE given recent discontinuation of Eliquis.  VQ scan ordered.  3/3: Vital stable, able to wean back to baseline use of oxygen at 8 L, D-dimer elevated at 1.68, procalcitonin 0.18, INR 1.1, troponin 32>>52, lactic acidosis resolved.  Slight worsening of creatinine, holding more IV diuresis as clinically appears euvolemic.  VQ scan indeterminate due to severe underlying lung disease, PE cannot be excluded at this time.  3/4: Remained hemodynamically stable on her baseline oxygen requirement of 8 L.  As her VQ scan was indeterminate, we discussed with patient and she agrees to resume her home Eliquis at 2.5 mg twice daily.  Patient will stop if she experience any further bleeding.  She was instructed to follow-up with her primary care provider for further assistance.  Her kidney function started improving after holding Lasix.  Patient is recently increased dose of Lasix along with metolazone.  We discontinued metolazone and decrease the Lasix to once daily as she appears dry. Her primary care can monitor and adjust the dose as needed.  Patient has softer blood pressure throughout her stay, currently at with lower goal.  We stopped her home amlodipine.  She will take Lasix 40 mg daily and antihypertensives can be added by PCP if needed.  Patient follow-up with hospice based on her underlying life limiting comorbidities with poor prognosis.  Patient will continue following with hospice and her provider for further assistance.  Assessment and Plan: * Acute on chronic  respiratory failure with hypoxia ([name]) Pulmonary fibrosis with severe pulmonary hypertension and right heart failure History of PE with thrombectomy Possible etiologies include PE, heart failure exacerbation Treating both possibilities, VQ scan inconclusive For possibility of PE: Started on heparin infusion in the ED For possibility of heart failure/volume overload: Given IV Lasix in the ED, holding further IV diuresis due to increasing creatinine and clinically appears euvolemic. Restarted on home Eliquis  Lactic acidosis Suspect secondary to acute respiratory failure.   No acute infectious source identified at this time, sepsis not suspected at this time Resolved.  Acute metabolic encephalopathy Presyncope Currently at baseline now Presyncope fall episode with urinary incontinence in the setting of end-stage lung disease chronic medical issues Treat acute etiologies as outlined below Neurologic checks with fall and aspiration precautions  (HFpEF) heart failure with preserved ejection fraction ([name]) BNP elevated to 859 x-ray unable to rule out overlying edema Received Lasix in the ED Holding further IV Lasix due to worsening creatinine. Daily weights Holding off on echo as patient is on hospice, BNP can be due to CKD and also severe pulmonary hypertension.  Mixed connective tissue disease ([name]) Scleroderma No acute concerns at this time  Stage 3b chronic kidney disease ([name]) Worsening creatinine at 2, it was 1.74 33-month ago. Patient did receive Lasix and clinically appears dry. -Holding further IV diuresis -Monitor renal function-improved to 1.64 today -Avoid nephrotoxins  Consultants: None Procedures performed: None Disposition: Hospice care Diet recommendation:  Discharge Diet Orders (From admission, onward)     Start     Ordered   09/29/23 0000  Diet - low sodium heart healthy        09/29/23 1153           Cardiac diet DISCHARGE MEDICATION: Allergies as  of 09/29/2023       Reactions   Hydroxychloroquine    headaches   Meclizine    headaches        Medication List     STOP taking these medications    amLODipine 10 MG tablet Commonly known as: NORVASC   Aspirin Low Dose 81 MG tablet Generic drug: aspirin EC   gabapentin 600 MG tablet Commonly known as: NEURONTIN Replaced by: gabapentin 300 MG capsule   metolazone 2.5 MG tablet Commonly known as: ZAROXOLYN   tadalafil (PAH) 20 MG tablet Commonly known as: ADCIRCA       TAKE these medications    ambrisentan 5 MG tablet Commonly known as: LETAIRIS Take 5 mg by mouth daily.   Eliquis 2.5 MG Tabs tablet Generic drug: apixaban Take 1 tablet (2.5 mg total) by mouth daily. What changed: Another medication with the same name was removed. Continue taking this medication, and follow the directions you see here.   feeding supplement Liqd Take 237 mLs by mouth 3 (three) times daily between meals.   furosemide 40 MG tablet Commonly known as: LASIX Take 1 tablet (40 mg total) by mouth daily. What changed: when to take this   gabapentin 300 MG capsule Commonly known as: NEURONTIN Take 1 capsule (300 mg total) by mouth 3 (three) times daily. Replaces: gabapentin 600 MG tablet   HYDROcodone bit-homatropine 5-1.5 MG/5ML syrup Commonly  known as: HYCODAN Take 5 mLs by mouth every 6 (six) hours as needed.   hydroxychloroquine 200 MG tablet Commonly known as: PLAQUENIL Take 200 mg by mouth daily.   ipratropium-albuterol 0.5-2.5 (3) MG/3ML Soln Commonly known as: DUONEB Take 3 mLs by nebulization every 4 (four) hours as needed.   LORazepam 0.5 MG tablet Commonly known as: ATIVAN Take 0.5 mg by mouth every 4 (four) hours as needed.   mirtazapine 7.5 MG tablet Commonly known as: REMERON Take 7.5 mg by mouth at bedtime.   Mucinex 600 MG 12 hr tablet Generic drug: guaiFENesin Take 600 mg by mouth 2 (two) times daily.   multivitamin with minerals tablet Take 1  tablet by mouth daily.   mycophenolate 500 MG tablet Commonly known as: CELLCEPT Take 500 mg by mouth 2 (two) times daily.   pantoprazole 40 MG tablet Commonly known as: PROTONIX Take 40 mg by mouth daily.   potassium chloride 10 MEQ tablet Commonly known as: KLOR-CON M Take by mouth.   Trelegy Ellipta 100-62.5-25 MCG/ACT Aepb Generic drug: Fluticasone-Umeclidin-Vilant Inhale 1 puff into the lungs daily.        Follow-up Information     Marisue Ivan, MD. Schedule an appointment as soon as possible for a visit in 1 week(s).   Specialty: Family Medicine Contact information: [address] [facility] [place] [address] [phone]                Discharge Exam: Ceasar Mons Weights   09/28/23 0503 09/29/23 0418  Weight: 60.4 kg 60.4 kg   General.  Frail and malnourished lady, in no acute distress. Pulmonary.  Lungs clear bilaterally, normal respiratory effort. CV.  Regular rate and rhythm, no JVD, rub or murmur. Abdomen.  Soft, nontender, nondistended, BS positive. CNS.  Alert and oriented .  No focal neurologic deficit. Extremities.  No edema, no cyanosis, pulses intact and symmetrical. Psychiatry.  Judgment and insight appears normal.   Condition at discharge: stable  The results of significant diagnostics from this hospitalization (including imaging, microbiology, ancillary and laboratory) are listed below for reference.   Imaging Studies: NM Pulmonary Perfusion Result Date: 09/28/2023 CLINICAL DATA:  Clinical concern for pulmonary embolus. EXAM: NUCLEAR MEDICINE PERFUSION LUNG SCAN TECHNIQUE: Perfusion images were obtained in multiple projections after intravenous injection of radiopharmaceutical. Ventilation scans intentionally deferred if perfusion scan and chest x-ray adequate for interpretation during COVID 19 epidemic. RADIOPHARMACEUTICALS:  4.4 mCi Tc-20m MAA IV COMPARISON:  Chest x-ray 09/28/2023.  Chest CTA 01/30/2023. FINDINGS:  Previous chest CT shows markedly advanced changes of pulmonary fibrosis with bullous disease in the apices and in the lung bases. Multiplanar perfusion imaging today shows markedly heterogeneous bilateral lung perfusion with segmental areas of decreased perfusion bilaterally. IMPRESSION: Indeterminate perfusion study given the advanced lung disease seen on previous CT chest imaging. Perfusion abnormality may be related to the extensive background underlying lung disease although pulmonary embolus cannot be excluded. Electronically Signed   By: Kennith Center M.D.   On: 09/28/2023 10:22   DG Chest Portable 1 View Result Date: 09/28/2023 CLINICAL DATA:  Moving patient to bed when she became lethargic and incontinence. Low oxygen saturations. Shortness of breath. EXAM: PORTABLE CHEST 1 VIEW COMPARISON:  Radiograph and CT 01/30/2023 FINDINGS: Stable cardiomegaly. Aortic atherosclerotic calcification. No pleural effusion or pneumothorax. Chronic coarsening of the interstitial markings throughout both lungs. Superimposed infection or edema is difficult to exclude. No focal pneumonia. No displaced rib fractures. IMPRESSION: Emphysema and chronic interstitial lung disease similar  to 01/30/2023. Superimposed infection or edema is difficult to exclude. Electronically Signed   By: Minerva Fester M.D.   On: 09/28/2023 03:39    Microbiology: Results for orders placed or performed during the hospital encounter of 09/28/23  Resp panel by RT-PCR (RSV, Flu A&B, Covid) Anterior Nasal Swab     Status: None   Collection Time: 09/28/23  3:09 AM   Specimen: Anterior Nasal Swab  Result Value Ref Range Status   SARS Coronavirus 2 by RT PCR NEGATIVE NEGATIVE Final    Comment: (NOTE) SARS-CoV-2 target nucleic acids are NOT DETECTED.  The SARS-CoV-2 RNA is generally detectable in upper respiratory specimens during the acute phase of infection. The lowest concentration of SARS-CoV-2 viral copies this assay can detect is 138  copies/mL. A negative result does not preclude SARS-Cov-2 infection and should not be used as the sole basis for treatment or other patient management decisions. A negative result may occur with  improper specimen collection/handling, submission of specimen other than nasopharyngeal swab, presence of viral mutation(s) within the areas targeted by this assay, and inadequate number of viral copies(<138 copies/mL). A negative result must be combined with clinical observations, patient history, and epidemiological information. The expected result is Negative.  Fact Sheet for Patients:  BloggerCourse.com  Fact Sheet for Healthcare Providers:  SeriousBroker.it  This test is no t yet approved or cleared by the [place] FDA and  has been authorized for detection and/or diagnosis of SARS-CoV-2 by FDA under an Emergency Use Authorization (EUA). This EUA will remain  in effect (meaning this test can be used) for the duration of the COVID-19 declaration under Section 564(b)(1) of the Act, 21 U.S.C.section 360bbb-3(b)(1), unless the authorization is terminated  or revoked sooner.       Influenza A by PCR NEGATIVE NEGATIVE Final   Influenza B by PCR NEGATIVE NEGATIVE Final    Comment: (NOTE) The Xpert Xpress SARS-CoV-2/FLU/RSV plus assay is intended as an aid in the diagnosis of influenza from Nasopharyngeal swab specimens and should not be used as a sole basis for treatment. Nasal washings and aspirates are unacceptable for Xpert Xpress SARS-CoV-2/FLU/RSV testing.  Fact Sheet for Patients: BloggerCourse.com  Fact Sheet for Healthcare Providers: SeriousBroker.it  This test is not yet approved or cleared by the [place] FDA and has been authorized for detection and/or diagnosis of SARS-CoV-2 by FDA under an Emergency Use Authorization (EUA). This EUA will remain in effect (meaning  this test can be used) for the duration of the COVID-19 declaration under Section 564(b)(1) of the Act, 21 U.S.C. section 360bbb-3(b)(1), unless the authorization is terminated or revoked.     Resp Syncytial Virus by PCR NEGATIVE NEGATIVE Final    Comment: (NOTE) Fact Sheet for Patients: BloggerCourse.com  Fact Sheet for Healthcare Providers: SeriousBroker.it  This test is not yet approved or cleared by the [place] FDA and has been authorized for detection and/or diagnosis of SARS-CoV-2 by FDA under an Emergency Use Authorization (EUA). This EUA will remain in effect (meaning this test can be used) for the duration of the COVID-19 declaration under Section 564(b)(1) of the Act, 21 U.S.C. section 360bbb-3(b)(1), unless the authorization is terminated or revoked.  Performed at [facility], [address] [place], [place] [address]   Blood culture (routine x 2)     Status: None (Preliminary result)   Collection Time: 09/28/23  3:09 AM   Specimen: BLOOD  Result Value Ref Range Status   Specimen Description BLOOD RIGHT ARM  Final   Special Requests   Final    BOTTLES DRAWN AEROBIC AND ANAEROBIC Blood Culture results may not be optimal due to an inadequate volume of blood received in culture bottles   Culture   Final    NO GROWTH 1 DAY Performed at [facility], [address] [place], [place] [address]    Report Status PENDING  Incomplete  Blood culture (routine x 2)     Status: None (Preliminary result)   Collection Time: 09/28/23  3:09 AM   Specimen: BLOOD  Result Value Ref Range Status   Specimen Description BLOOD RIGHT ARM  Final   Special Requests   Final    BOTTLES DRAWN AEROBIC AND ANAEROBIC Blood Culture results may not be optimal due to an inadequate volume of blood received in culture bottles   Culture   Final    NO GROWTH 1 DAY Performed at [facility], [address]  [place], [place] [address]    Report Status PENDING  Incomplete    Labs: CBC: Recent Labs  Lab 09/28/23 0309 09/29/23 0515  WBC 4.3 4.0  NEUTROABS 2.6  --   HGB 13.8 12.3  HCT 43.5 36.5  MCV 102.8* 96.1  PLT 157 162   Basic Metabolic Panel: Recent Labs  Lab 09/28/23 0309 09/29/23 0515  NA 134* 137  K 3.5 3.2*  CL 90* 94*  CO2 27 33*  GLUCOSE 181* 90  BUN 64* 66*  CREATININE 2.00* 1.64*  CALCIUM 9.7 9.4   Liver Function Tests: Recent Labs  Lab 09/28/23 0309  AST 36  ALT 19  ALKPHOS 170*  BILITOT 1.2  PROT 8.0  ALBUMIN 4.0   CBG: No results for input(s): "GLUCAP" in the last 168 hours.  Discharge time spent: greater than 30 minutes.  This record has been created using Conservation officer, historic buildings. Errors have been sought and corrected,but may not always be located. Such creation errors do not reflect on the standard of care.   Signed: Arnetha Courser, MD [facility] 09/29/2023

## 2023-09-29 NOTE — Progress Notes (Signed)
 PHARMACY - ANTICOAGULATION CONSULT NOTE  Pharmacy Consult for Heparin  Indication: pulmonary embolus  Allergies  Allergen Reactions   Hydroxychloroquine     headaches   Meclizine     headaches    Patient Measurements: Height: 5\' 5"  (165.1 cm) Weight: 60.4 kg (133 lb 2.5 oz) IBW/kg (Calculated) : 57 Heparin Dosing Weight: 60.4 kg   Vital Signs: Temp: 98.2 F (36.8 C) (03/04 0515) Temp Source: Oral (03/04 0515) BP: 95/68 (03/04 0515) Pulse Rate: 89 (03/04 0515)  Labs: Recent Labs    09/28/23 0309 09/28/23 0456 09/28/23 1349 09/28/23 2220 09/29/23 0515  HGB 13.8  --   --   --  12.3  HCT 43.5  --   --   --  36.5  PLT 157  --   --   --  162  APTT  --  25 83*  --   --   LABPROT  --  14.1  --   --   --   INR  --  1.1  --   --   --   HEPARINUNFRC  --  <0.10*  --  0.41 0.45  CREATININE 2.00*  --   --   --   --   TROPONINIHS 32* 52*  --   --   --     Estimated Creatinine Clearance: 26.2 mL/min (A) (by C-G formula based on SCr of 2 mg/dL (H)).   Medical History: Past Medical History:  Diagnosis Date   COPD (chronic obstructive pulmonary disease) ([name])    Discoid lupus    Eczema    Esophageal dysmotility    Genital herpes    GERD (gastroesophageal reflux disease)    HSV (herpes simplex virus) infection    Hypertension    Lumbago    Lumbar disc disease    Peripheral neuropathy    Pulmonary fibrosis ([name])    Pulmonary hypertension ([name])    Raynaud's disease    Sclerodactyly    Scleroderma ([name])    Spinal stenosis    Assessment: Pharmacy consulted to dose heparin in this 63 year old female admitted with PE.  Pt was on Eliquis 5 mg PO BID PTA,  last dose was on Fri 2/28.   Pt was told to stop Eliquis due to bleeding issues.  CrCl = 26.2 ml/min , HL @ 0456 = < 0.1 (Baseline)   Date Time Results Comments  3/3 0547 aPTT=25 Heparin infusion started at 1050 units/hour  3/3 1349 aPTT=83 Therapeutic  3/3 2220 HL = 0.41 Therapeutic x 2  3/4 0515 HL = 0.45  Therapeutic x 3   Goal of Therapy:  Heparin level 0.3-0.7 units/ml aPTT 66 - 102 seconds Monitor platelets by anticoagulation protocol: Yes   Plan:  Will continue heparin infusion rate at 1050 units/hours Recheck HL daily w/ AM labs while therapeutic CBC daily while on heparin infusion  Otelia Sergeant, PharmD, [facility] 09/29/2023 6:31 AM

## 2023-09-29 NOTE — Plan of Care (Signed)
  Problem: Activity: Goal: Risk for activity intolerance will decrease Outcome: Progressing   Problem: Nutrition: Goal: Adequate nutrition will be maintained Outcome: Progressing   Problem: Coping: Goal: Level of anxiety will decrease Outcome: Progressing   Problem: Elimination: Goal: Will not experience complications related to bowel motility Outcome: Progressing   Problem: Pain Managment: Goal: General experience of comfort will improve and/or be controlled Outcome: Progressing

## 2023-10-03 LAB — CULTURE, BLOOD (ROUTINE X 2)
Culture: NO GROWTH
Culture: NO GROWTH

## 2023-10-03 LAB — BLOOD GAS, VENOUS
Acid-Base Excess: 3.5 mmol/L — ABNORMAL HIGH (ref 0.0–2.0)
Bicarbonate: 29.6 mmol/L — ABNORMAL HIGH (ref 20.0–28.0)
O2 Saturation: 15.1 %
Patient temperature: 37
pCO2, Ven: 50 mmHg (ref 44–60)
pH, Ven: 7.38 (ref 7.25–7.43)

## 2023-10-27 DEATH — deceased
